# Patient Record
Sex: Female | Born: 1955 | Race: Black or African American | Hispanic: No | Marital: Single | Smoking: Never smoker
Health system: Southern US, Community
[De-identification: ages and names within clinical notes are randomized; demographics above are authoritative.]

## PROBLEM LIST (undated history)

## (undated) DIAGNOSIS — K219 Gastro-esophageal reflux disease without esophagitis: Secondary | ICD-10-CM

## (undated) DIAGNOSIS — Z8049 Family history of malignant neoplasm of other genital organs: Secondary | ICD-10-CM

## (undated) DIAGNOSIS — Z800 Family history of malignant neoplasm of digestive organs: Secondary | ICD-10-CM

## (undated) DIAGNOSIS — C55 Malignant neoplasm of uterus, part unspecified: Secondary | ICD-10-CM

## (undated) DIAGNOSIS — I10 Essential (primary) hypertension: Secondary | ICD-10-CM

## (undated) DIAGNOSIS — C541 Malignant neoplasm of endometrium: Secondary | ICD-10-CM

## (undated) DIAGNOSIS — M1990 Unspecified osteoarthritis, unspecified site: Secondary | ICD-10-CM

## (undated) DIAGNOSIS — Z6841 Body Mass Index (BMI) 40.0 and over, adult: Secondary | ICD-10-CM

## (undated) DIAGNOSIS — Z808 Family history of malignant neoplasm of other organs or systems: Secondary | ICD-10-CM

## (undated) DIAGNOSIS — L509 Urticaria, unspecified: Secondary | ICD-10-CM

## (undated) HISTORY — DX: Malignant neoplasm of endometrium: C54.1

## (undated) HISTORY — PX: TONSILLECTOMY: SUR1361

## (undated) HISTORY — DX: Family history of malignant neoplasm of digestive organs: Z80.0

## (undated) HISTORY — DX: Family history of malignant neoplasm of other organs or systems: Z80.8

## (undated) HISTORY — PX: WISDOM TOOTH EXTRACTION: SHX21

## (undated) HISTORY — DX: Urticaria, unspecified: L50.9

## (undated) HISTORY — DX: Body Mass Index (BMI) 40.0 and over, adult: Z684

## (undated) HISTORY — DX: Morbid (severe) obesity due to excess calories: E66.01

## (undated) HISTORY — DX: Unspecified osteoarthritis, unspecified site: M19.90

## (undated) HISTORY — DX: Family history of malignant neoplasm of other genital organs: Z80.49

## (undated) HISTORY — DX: Essential (primary) hypertension: I10

## (undated) HISTORY — DX: Malignant neoplasm of uterus, part unspecified: C55

---

## 1994-05-18 HISTORY — PX: LAPAROSCOPY: SHX197

## 1997-09-19 ENCOUNTER — Other Ambulatory Visit: Admission: RE | Admit: 1997-09-19 | Discharge: 1997-09-19 | Payer: Self-pay | Admitting: Obstetrics and Gynecology

## 1998-07-11 ENCOUNTER — Encounter: Payer: Self-pay | Admitting: Obstetrics and Gynecology

## 1998-07-11 ENCOUNTER — Ambulatory Visit (HOSPITAL_COMMUNITY): Admission: RE | Admit: 1998-07-11 | Discharge: 1998-07-11 | Payer: Self-pay | Admitting: *Deleted

## 2000-11-17 ENCOUNTER — Other Ambulatory Visit: Admission: RE | Admit: 2000-11-17 | Discharge: 2000-11-17 | Payer: Self-pay | Admitting: Obstetrics and Gynecology

## 2001-12-30 ENCOUNTER — Other Ambulatory Visit: Admission: RE | Admit: 2001-12-30 | Discharge: 2001-12-30 | Payer: Self-pay | Admitting: Obstetrics and Gynecology

## 2009-05-18 HISTORY — PX: OTHER SURGICAL HISTORY: SHX169

## 2019-06-07 ENCOUNTER — Other Ambulatory Visit: Payer: Self-pay | Admitting: Family

## 2019-06-07 DIAGNOSIS — N938 Other specified abnormal uterine and vaginal bleeding: Secondary | ICD-10-CM

## 2019-06-12 ENCOUNTER — Ambulatory Visit
Admission: RE | Admit: 2019-06-12 | Discharge: 2019-06-12 | Disposition: A | Discharge status: Home / Self Care | Payer: 59 | Source: Ambulatory Visit | Attending: Family | Admitting: Family

## 2019-06-12 DIAGNOSIS — N938 Other specified abnormal uterine and vaginal bleeding: Secondary | ICD-10-CM

## 2019-06-15 ENCOUNTER — Other Ambulatory Visit: Payer: Self-pay | Admitting: Primary Care

## 2019-06-30 ENCOUNTER — Other Ambulatory Visit (HOSPITAL_COMMUNITY)
Admission: RE | Admit: 2019-06-30 | Discharge: 2019-06-30 | Disposition: A | Discharge status: Home / Self Care | Payer: 59 | Source: Ambulatory Visit | Attending: Family Medicine | Admitting: Family Medicine

## 2019-06-30 ENCOUNTER — Ambulatory Visit (INDEPENDENT_AMBULATORY_CARE_PROVIDER_SITE_OTHER): Payer: 59 | Admitting: Family Medicine

## 2019-06-30 ENCOUNTER — Encounter: Payer: Self-pay | Admitting: Family Medicine

## 2019-06-30 ENCOUNTER — Other Ambulatory Visit: Payer: Self-pay

## 2019-06-30 VITALS — BP 135/83 | HR 86 | Temp 97.8°F | Ht 61.0 in | Wt 270.8 lb

## 2019-06-30 DIAGNOSIS — N930 Postcoital and contact bleeding: Secondary | ICD-10-CM | POA: Diagnosis present

## 2019-06-30 DIAGNOSIS — E66813 Obesity, class 3: Secondary | ICD-10-CM | POA: Insufficient documentation

## 2019-06-30 DIAGNOSIS — E7800 Pure hypercholesterolemia, unspecified: Secondary | ICD-10-CM | POA: Insufficient documentation

## 2019-06-30 DIAGNOSIS — N888 Other specified noninflammatory disorders of cervix uteri: Secondary | ICD-10-CM

## 2019-06-30 DIAGNOSIS — I10 Essential (primary) hypertension: Secondary | ICD-10-CM | POA: Insufficient documentation

## 2019-06-30 DIAGNOSIS — N950 Postmenopausal bleeding: Secondary | ICD-10-CM | POA: Diagnosis not present

## 2019-06-30 NOTE — Progress Notes (Signed)
   Subjective:    Patient ID: [name] L Heron is a 64 y.o. female presenting with New Gyn (DUB)  on 06/30/2019  HPI: Menopause age 39. Bleeding 2016, endometrial biopsy negative at that time. No further bleeding until 03/2019. Still having bleeding, persistent flow x many days since then. bleeding comes and goes. Last pap was 05/2019 and showed AGUS, favor neoplasm + ASCUS. U/S shows thickened lining with solid mass that needs biopsy.  Review of Systems  Constitutional: Negative for chills and fever.  Respiratory: Negative for shortness of breath.   Cardiovascular: Negative for chest pain.  Gastrointestinal: Negative for abdominal pain, nausea and vomiting.  Genitourinary: Negative for dysuria.  Skin: Negative for rash.      Objective:    BP 135/83   Pulse 86   Temp 97.8 F (36.6 C)   Ht 5\' 1"  (1.549 m)   Wt 270 lb 12.8 oz (122.8 kg)   BMI 51.17 kg/m  Physical Exam Constitutional:      General: She is not in acute distress.    Appearance: She is well-developed.  HENT:     Head: Normocephalic and atraumatic.  Eyes:     General: No scleral icterus. Cardiovascular:     Rate and Rhythm: Normal rate.  Pulmonary:     Effort: Pulmonary effort is normal.  Abdominal:     Palpations: Abdomen is soft.  Genitourinary:    Comments: BUS normal, vagina is pink and rugated, cervix has mass protruding from os.   Musculoskeletal:     Cervical back: Neck supple.  Skin:    General: Skin is warm and dry.  Neurological:     Mental Status: She is alert and oriented to person, place, and time.    Procedure: Cervix visualized and mass noted.  Mass biopsied x 2 with ring forceps.  Hemostasis obtained with Monsel's solution.  Procedure: Portio of cervix cleansed x 2 with betadine swabs.  A tenaculum was placed in the anterior lip of the cervix.  The uterus was sounded for depth of 10 cm. A pipelle was introduced to into the uterus, suction created,  and an endometrial sample was  obtained. All equipment was removed and accounted for.  The patient tolerated the procedure well.       Assessment & Plan:   Problem List Items Addressed This Visit      Unprioritized   Postmenopausal bleeding    Endometrial sampling today as well. If comes back negative, would consider D & C with Hysteroscopy and Myosure to ensure adequate sampling--given age and u/s findings and risks.      Relevant Orders   Surgical pathology( [place]/ [facility])   Cervical mass - Primary    Unclear if this is a primary cervical vs. Endometrial--biopsy pending      Relevant Orders   Surgical pathology( [place]/ [facility])   Obesity, morbid, BMI 50 or higher ([place])      Total time reviewing records, notes, images and with patient, and documenting: 42 minutes.  Return in about 4 weeks (around 07/28/2019) for virtual.  Donnamae Jude 06/30/2019 3:53 PM

## 2019-06-30 NOTE — Patient Instructions (Signed)

## 2019-06-30 NOTE — Assessment & Plan Note (Signed)
Unclear if this is a primary cervical vs. Endometrial--biopsy pending

## 2019-06-30 NOTE — Progress Notes (Signed)
Pt states she has been menopausal since age 64. She had one episode of bleeding @ age 46 - no tests done. She saw physician in [place]. Then light bleeding began again @ age 80 - she had Endo Bx which was normal. The bleeding stopped and then returned in August 2020, stopped until November. Since November, pt has had continuous bleeding and abdominal cramping. Pt had [place] in January @ [place] and was referred to this office by Dr. Burnett Sheng.

## 2019-06-30 NOTE — Assessment & Plan Note (Signed)
Endometrial sampling today as well. If comes back negative, would consider D & C with Hysteroscopy and Myosure to ensure adequate sampling--given age and u/s findings and risks.

## 2019-07-03 ENCOUNTER — Telehealth: Payer: Self-pay

## 2019-07-03 ENCOUNTER — Telehealth: Payer: Self-pay | Admitting: Family Medicine

## 2019-07-03 ENCOUNTER — Encounter: Payer: Self-pay | Admitting: Gynecologic Oncology

## 2019-07-03 LAB — SURGICAL PATHOLOGY

## 2019-07-03 NOTE — Telephone Encounter (Signed)
Called patient and informed her of her biopsy results, which shows probable endometrial cancer. Stopped bleeding over the weekend. Advised to see GYN/Onc with Dr. Berline Lopes 07/06/2019 at 11:00 at the [facility].

## 2019-07-03 NOTE — Progress Notes (Deleted)
GYNECOLOGIC ONCOLOGY NEW PATIENT CONSULTATION   Patient Name: Brittney Lamb  Patient Age: 64 y.o. Date of Service: *** Referring Provider: [name], MD [address] [place],  [place] [address]   Primary Care Provider: [name], MD Consulting Provider: Jeral Pinch, MD   Assessment/Plan:  ***   A copy of this note was sent to the patient's referring provider.   Jeral Pinch, MD  Division of Gynecologic Oncology  Department of Obstetrics and Gynecology  [facility]  ___________________________________________  Chief Complaint: No chief complaint on file.   History of Present Illness:  Brittney Lamb is a 64 y.o. y.o. female who is seen in consultation at the request of [name], MD for an evaluation of ***  ***Menopause age 64. Bleeding 2016, endometrial biopsy negative at that time. No further bleeding until 03/2019. Still having bleeding, persistent flow x many days since then. bleeding comes and goes. Last pap was 05/2019 and showed AGUS, favor neoplasm + ASCUS. U/S shows thickened lining with solid mass that needs biopsy.  EMB was performed by Dr. Kennon Rounds on 2/12.  PAST MEDICAL HISTORY:  Past Medical History:  Diagnosis Date  . Arthritis    Spinal - inflammatory  . BMI 50.0-59.9, adult ([place])   . Endometrial cancer ([place])   . Hypertension   . Obesity      PAST SURGICAL HISTORY:  *** The histories are not reviewed yet. Please review them in the "History" navigator section and refresh this [place].  OB/GYN HISTORY:  OB History  Gravida Para Term Preterm AB Living  0 0 0 0 0 0  SAB TAB Ectopic Multiple Live Births  0 0 0 0 0    No LMP recorded. Patient is postmenopausal.  Age at menarche: ***  Age at menopause: *** Hx of HRT: *** Hx of STDs: *** Last pap: *** History of abnormal pap smears: ***  SCREENING STUDIES:  Last mammogram: ***  Last colonoscopy: *** Last bone mineral density:  ***  MEDICATIONS: Outpatient Encounter Medications as of 07/06/2019  Medication Sig  . amLODipine (NORVASC) 2.5 MG tablet Take 2.5 mg by mouth daily.  . hydrochlorothiazide (HYDRODIURIL) 25 MG tablet Take 25 mg by mouth daily.  . meloxicam (MOBIC) 15 MG tablet Take 15 mg by mouth daily.  . Multiple Vitamin (MULTIVITAMIN) tablet Take 1 tablet by mouth daily.  . rosuvastatin (CRESTOR) 20 MG tablet Take 20 mg by mouth daily.  . vitamin B-12 (CYANOCOBALAMIN) 500 MCG tablet Take 500 mcg by mouth daily.   No facility-administered encounter medications on file as of 07/06/2019.    ALLERGIES:  Allergies  Allergen Reactions  . Lisinopril Swelling  . Saxenda [Liraglutide -Weight Management] Itching and Rash     FAMILY HISTORY:  Family History  Problem Relation Age of Onset  . Heart disease Father   . Thyroid disease Father   . Hypertension Mother   . COPD Mother   . Hypertension Brother   . Thyroid disease Sister   . COPD Sister   . Hypertension Sister   . Neuropathy Sister      SOCIAL HISTORY:    Social Connections:   . Frequency of Communication with Friends and Family: Not on file  . Frequency of Social Gatherings with Friends and Family: Not on file  . Attends Religious Services: Not on file  . Active Member of Clubs or Organizations: Not on file  . Attends Archivist Meetings: Not on file  .  Marital Status: Not on file    REVIEW OF SYSTEMS:  Denies appetite changes, fevers, chills, fatigue, unexplained weight changes. Denies hearing loss, neck lumps or masses, mouth sores, ringing in ears or voice changes. Denies cough or wheezing.  Denies shortness of breath. Denies chest pain or palpitations. Denies leg swelling. Denies abdominal distention, pain, blood in stools, constipation, diarrhea, nausea, vomiting, or early satiety. Denies pain with intercourse, dysuria, frequency, hematuria or incontinence. Denies hot flashes, pelvic pain, vaginal bleeding or vaginal  discharge.   Denies joint pain, back pain or muscle pain/cramps. Denies itching, rash, or wounds. Denies dizziness, headaches, numbness or seizures. Denies swollen lymph nodes or glands, denies easy bruising or bleeding. Denies anxiety, depression, confusion, or decreased concentration.  Physical Exam:  Vital Signs for this encounter:  There were no vitals taken for this visit. There is no height or weight on file to calculate BMI. General: Alert, oriented, no acute distress.  HEENT: Normocephalic, atraumatic. Sclera anicteric.  Chest: Clear to auscultation bilaterally. No wheezes, rhonchi, or rales. Cardiovascular: Regular rate and rhythm, no murmurs, rubs, or gallops.  Abdomen: ***Obese. Normoactive bowel sounds. Soft, nondistended, nontender to palpation. No masses or hepatosplenomegaly appreciated. No palpable fluid wave.  Extremities: Grossly normal range of motion. Warm, well perfused. No edema bilaterally.  Skin: No rashes or lesions.  Lymphatics: No cervical, supraclavicular, or inguinal adenopathy.  GU:  Normal external female genitalia. ***  No lesions. No discharge or bleeding.             Bladder/urethra:  No lesions or masses, well supported bladder             Vagina: ***             Cervix: Normal appearing, no lesions.             Uterus: *** Small, mobile, no parametrial involvement or nodularity.             Adnexa: *** masses.  Rectal: ***  LABORATORY AND RADIOLOGIC DATA:  ***Outside medical records were reviewed to synthesize the above history, along with the history and physical obtained during the visit.   No results found for: WBC, HGB, HCT, PLT, GLUCOSE, CHOL, TRIG, HDL, LDLDIRECT, LDLCALC, ALT, AST, NA, K, CL, CREATININE, BUN, CO2, TSH, PSA, INR, GLUF, HGBA1C, MICROALBUR  06/30/19: FINAL MICROSCOPIC DIAGNOSIS:  A. ENDOMETRIUM, BIOPSY:  - Adenocarcinoma, see comment.  B. CERVICAL, POLYPECTOMY:  - Adenocarcinoma, see comment.  COMMENT:  A. and B. 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 Dr. Saralyn Pilar has reviewed the case. Dr. Kennon Rounds was paged on 07/03/2019.   Pelvic ultrasound on 1/25: IMPRESSION: 1. Possible 1.9 cm solid echogenic endometrial mass. In the setting of post-menopausal bleeding, endometrial sampling is indicated to exclude carcinoma. If results are benign, sonohysterogram should be considered for focal lesion work-up prior to hysteroscopy. (Ref: Radiological Reasoning: Algorithmic Workup of Abnormal Vaginal Bleeding with Endovaginal Sonography and Sonohysterography. AJR 2008; [id number]) 2. Enlarged uterus with at least 1 calcified fibroid at the fundus measuring 2.6 cm. 3. Heterogenous fluid within the endometrial canal, can be seen in the setting of cervical obstruction. 4. Nonvisualized ovaries

## 2019-07-03 NOTE — Telephone Encounter (Signed)
Gave Dr. Kennon Rounds  A new patient appointment  For 07-06-19 at 1130 am with arrival at 1100. Pt with endometrial cancer.

## 2019-07-05 ENCOUNTER — Telehealth: Payer: Self-pay | Admitting: *Deleted

## 2019-07-05 NOTE — Telephone Encounter (Signed)
Patient returned call and decided to move her appt to next week due to the pending weather

## 2019-07-06 ENCOUNTER — Inpatient Hospital Stay: Payer: 59 | Admitting: Gynecologic Oncology

## 2019-07-12 NOTE — Progress Notes (Addendum)
GYNECOLOGIC ONCOLOGY NEW PATIENT CONSULTATION   Patient Name: Brittney Lamb  Patient Age: 64 y.o. Date of Service: 07/14/19 Referring Provider: [name], MD [address] 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  We reviewed the nature of endometrial cancer and its recommended surgical staging, including total hysterectomy, bilateral salpingo-oophorectomy, and lymph node assessment. The patient is a suitable candidate for staging via a minimally invasive approach to surgery.  We reviewed that robotic assistance would be used to complete the surgery.    We discussed that most endometrial cancer is detected early, however, we reviewed that adjuvant therapy will likely be recommended based on the patient's biopsy, however, we will defer to final pathology results.    Given her high risk histology, we recommend CT scan preoperatively to rule out metastatic disease.  We will also get a CA-125 today.  I am somewhat suspicious that the patient has cervical involvement based on exam today.  While sentinel lymph node biopsy for uterine cancer is typically used only in the setting of clinical stage I disease, I recommend proceeding with sentinel lymph node biopsy in this patient.  Given her significant obesity, surgical lymph node evaluation will be challenging.  Given her high risk histology, especially if she has cervical involvement, she is going to be a candidate for adjuvant therapy.  I worry about the increased morbidity of surgery with an open procedure for lymph node assessment only and feel that it would be quite challenging to perform a lymph node dissection even with the use of a laparotomy.  We also discussed the possibility that she may not tolerate Trendelenburg due to her  weight.  We reviewed the sentinel lymph node technique. Risks and benefits of sentinel lymph node biopsy was reviewed. We reviewed the technique and ICG dye. The patient DOES NOT have an iodine allergy or known liver dysfunction. We reviewed the false negative rate (0.4%), and that 3% of patients with metastatic disease will not have it detected by SLN biopsy in endometrial cancer. A low risk of allergic reaction to the dye, <0.2% for ICG, has been reported. We also discussed that in the case of failed mapping, which occurs 40% of the time, a bilateral or unilateral lymphadenectomy will be performed at the surgeon's discretion.   Potential benefits of sentinel nodes including a higher detection rate for metastasis due to ultrastaging and potential reduction in operative morbidity. However, there remains uncertainty as to the role for treatment of micrometastatic disease. Further, the benefit of operative morbidity associated with the SLN technique in endometrial cancer is not yet completely known. In other patient populations (e.g. the cervical cancer population) there has been observed reductions in morbidity with SLN biopsy compared to pelvic lymphadenectomy. Lymphedema, nerve dysfunction and lymphocysts are all potential risks with the SLN technique as with complete lymphadenectomy. Additional risks to the patient include the risk of damage to an internal organ while operating in an altered view (e.g. the black and white image of the robotic fluorescence imaging mode).   We discussed plan for a robotic assisted hysterectomy, bilateral salpingo-oophorectomy, sentinel lymph node evaluation, possible lymph node dissection, possible laparotomy. The risks of surgery were discussed in detail and she understands these to include infection; wound separation; hernia; vaginal cuff separation, injury to adjacent organs such as bowel, bladder,  blood vessels, ureters and nerves; bleeding which may require blood  transfusion; anesthesia risk; thromboembolic events; possible death; unforeseen complications; possible need for re-exploration; medical complications such as heart attack, stroke, pleural effusion and pneumonia; and, if full lymphadenectomy is performed the risk of lymphedema and lymphocyst. The patient will receive DVT and antibiotic prophylaxis as indicated. She voiced a clear understanding. She had the opportunity to ask questions. Perioperative instructions were reviewed with her. Prescriptions for post-op medications were sent to her pharmacy of choice.  A copy of this note was sent to the patient's referring provider.   50 minutes of total time was spent for this patient encounter, including preparation, face-to-face counseling with the patient and coordination of care, and documentation of the encounter.   Jeral Pinch, MD  Division of Gynecologic Oncology  Department of Obstetrics and Gynecology  [facility]  ___________________________________________  Chief Complaint: Chief Complaint  Patient presents with  . Endometrial cancer [facility])    New patient    History of Present Illness:  Brittney Lamb is a 64 y.o. y.o. female who is seen in consultation at the request of Dr. 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  Patient reports having postmenopausal bleeding since November that started as spotting and then became heavier like a menses.  She had bleeding intermittently, not daily.  She endorsed cramps and passage of clots.  Since menopause, she has had 2 shorter periods (1-3 days) of spotting in the last 4-5 years.  She underwent a Pap test in January 2021 that showed Herbert Pun, favor neoplasm.  Ultrasound showed thickened endometrial lining with solid mass.  She underwent endometrial biopsy on 2/12 with findings noted below.  Overall she reports doing well.  She endorses a normal appetite without  nausea or emesis.  She denies any early satiety.  She reports normal bowel and bladder function.  She has some shortness of breath with ambulation but can climb a flight of stairs without difficulty.  She had laparoscopic surgery in the 1990s for what sounds like infertility and some tubal disease.  At that time, she was told that she had several uterine fibroids.  She denies any other abdominal surgery.  PAST MEDICAL HISTORY:  Past Medical History:  Diagnosis Date  . Arthritis    Spinal - inflammatory  . BMI 50.0-59.9, adult ([place])   . Endometrial cancer ([place])   . Hypertension   . Obesity   . Uterine cancer ([place])      PAST SURGICAL HISTORY:  Past Surgical History:  Procedure Laterality Date  . cardiac catherization  2011  . LAPAROSCOPY  1996   scar tissue  . TONSILLECTOMY    . WISDOM TOOTH EXTRACTION      OB/GYN HISTORY:  OB History  Gravida Para Term Preterm AB Living  0 0 0 0 0 0  SAB TAB Ectopic Multiple Live Births  0 0 0 0 0    No LMP recorded. Patient is postmenopausal.  Age at menarche: 66  Age at menopause: 57 Hx of HRT: no Hx of STDs: no Last pap: 2021 History of abnormal pap smears: reports abnormal pap in 1990s that did not require biopsy or procedures  SCREENING STUDIES:  Last mammogram: 2020  Last colonoscopy: 2020  MEDICATIONS: Outpatient Encounter Medications as of 07/14/2019  Medication Sig  . amLODipine (NORVASC) 2.5 MG tablet Take 2.5 mg by mouth daily.  . hydrochlorothiazide (HYDRODIURIL) 25 MG tablet Take 25 mg  by mouth daily.  Marland Kitchen ibuprofen (ADVIL) 600 MG tablet Take 1 tablet (600 mg total) by mouth every 6 (six) hours as needed for moderate pain. For AFTER surgery only  . meloxicam (MOBIC) 15 MG tablet Take 15 mg by mouth daily.  . Multiple Vitamin (MULTIVITAMIN) tablet Take 1 tablet by mouth daily.  Marland Kitchen oxyCODONE (OXY IR/ROXICODONE) 5 MG immediate release tablet Take 1 tablet (5 mg total) by mouth every 4 (four) hours as needed for severe pain.  For AFTER surgery only, do not take and drive  . rosuvastatin (CRESTOR) 20 MG tablet Take 20 mg by mouth daily.  Marland Kitchen senna-docusate (SENOKOT-S) 8.6-50 MG tablet Take 2 tablets by mouth at bedtime. For AFTER surgery, do not take if having diarrhea  . vitamin B-12 (CYANOCOBALAMIN) 500 MCG tablet Take 500 mcg by mouth daily.   No facility-administered encounter medications on file as of 07/14/2019.    ALLERGIES:  Allergies  Allergen Reactions  . Lisinopril Swelling  . Saxenda [Liraglutide -Weight Management] Itching and Rash     FAMILY HISTORY:  Family History  Problem Relation Age of Onset  . Heart disease Father   . Thyroid disease Father   . Hypertension Mother   . COPD Mother   . Hypertension Brother   . Thyroid disease Sister   . COPD Sister   . Hypertension Sister   . Neuropathy Sister   . Ovarian cancer Paternal Aunt   . Colon cancer Neg Hx   . Uterine cancer Neg Hx   . Breast cancer Neg Hx      SOCIAL HISTORY:    Social Connections:   . Frequency of Communication with Friends and Family: Not on file  . Frequency of Social Gatherings with Friends and Family: Not on file  . Attends Religious Services: Not on file  . Active Member of Clubs or Organizations: Not on file  . Attends Archivist Meetings: Not on file  . Marital Status: Not on file    REVIEW OF SYSTEMS:  + vaginal bleeding Denies appetite changes, fevers, chills, fatigue, unexplained weight changes. Denies hearing loss, neck lumps or masses, mouth sores, ringing in ears or voice changes. Denies cough or wheezing.  Denies shortness of breath. Denies chest pain or palpitations. Denies leg swelling. Denies abdominal distention, pain, blood in stools, constipation, diarrhea, nausea, vomiting, or early satiety. Denies pain with intercourse, dysuria, frequency, hematuria or incontinence. Denies hot flashes, pelvic pain or vaginal discharge.   Denies joint pain, back pain or muscle  pain/cramps. Denies itching, rash, or wounds. Denies dizziness, headaches, numbness or seizures. Denies swollen lymph nodes or glands, denies easy bruising or bleeding. Denies anxiety, depression, confusion, or decreased concentration.  Physical Exam:  Vital Signs for this encounter:  Blood pressure (!) 157/96, pulse 77, temperature 98.9 F (37.2 C), temperature source Temporal, resp. rate 18, SpO2 100 %. There is no height or weight on file to calculate BMI. General: Alert, oriented, no acute distress.  HEENT: Normocephalic, atraumatic. Sclera anicteric.  Chest: Clear to auscultation bilaterally, somewhat distant breath sounds. No wheezes, rhonchi, or rales. Cardiovascular: Regular rate and rhythm, no murmurs, rubs, or gallops.  Abdomen: Obese. Normoactive bowel sounds. Soft, nondistended, nontender to palpation. No masses or hepatosplenomegaly appreciated. No palpable fluid wave.  Extremities: Grossly normal range of motion. Warm, well perfused. Trace edema bilaterally.  Skin: No rashes or lesions.  Lymphatics: No cervical, supraclavicular, or inguinal adenopathy.  GU:  Normal external female genitalia. No lesions. No discharge or bleeding.  Bladder/urethra:  No lesions or masses, well supported bladder             Vagina: Mildly atrophic, no lesions.             Cervix: Normal appearing ectocervix, cervical os dilated approximately 1 cm with friable endocervical tissue and some possibly necrotic appearing tissue within the canal.  On bimanual exam, cervix mildly firm.             Uterus: mobile, no parametrial involvement or nodularity.             Adnexa: No masses appreciated.  Rectal: No nodularity.  LABORATORY AND RADIOLOGIC DATA:  Outside medical records were reviewed to synthesize the above history, along with the history and physical obtained during the visit.   Lab Results  Component Value Date   GLUCOSE 94 07/14/2019   ALT 14 07/14/2019   AST 18 07/14/2019    NA 142 07/14/2019   K 3.5 07/14/2019   CL 103 07/14/2019   CREATININE 0.97 07/14/2019   BUN 14 07/14/2019   CO2 31 07/14/2019     06/30/19: FINAL MICROSCOPIC DIAGNOSIS:  A. ENDOMETRIUM, BIOPSY:  - Adenocarcinoma, see comment.  B. CERVICAL, POLYPECTOMY:  - Adenocarcinoma, see comment.  COMMENT:  A. and B. The morphology is most consistent with an endometrial adenocarcinoma, specifically high grade serous carcinoma. Dr. Saralyn Pilar has reviewed the case. Dr. Kennon Rounds was paged on 07/03/2019.   Pelvic ultrasound on 1/25: IMPRESSION: 1. Possible 1.9 cm solid echogenic endometrial mass. In the setting of post-menopausal bleeding, endometrial sampling is indicated to exclude carcinoma. If results are benign, sonohysterogram should be considered for focal lesion work-up prior to hysteroscopy. (Ref: Radiological Reasoning: Algorithmic Workup of Abnormal Vaginal Bleeding with Endovaginal Sonography and Sonohysterography. AJR 2008; [id number]) 2. Enlarged uterus with at least 1 calcified fibroid at the fundus measuring 2.6 cm. 3. Heterogenous fluid within the endometrial canal, can be seen in the setting of cervical obstruction. 4. Nonvisualized ovaries

## 2019-07-14 ENCOUNTER — Inpatient Hospital Stay (HOSPITAL_BASED_OUTPATIENT_CLINIC_OR_DEPARTMENT_OTHER): Payer: 59 | Admitting: Gynecologic Oncology

## 2019-07-14 ENCOUNTER — Encounter: Payer: Self-pay | Admitting: Gynecologic Oncology

## 2019-07-14 ENCOUNTER — Other Ambulatory Visit: Payer: Self-pay | Admitting: Gynecologic Oncology

## 2019-07-14 ENCOUNTER — Inpatient Hospital Stay: Payer: 59 | Attending: Gynecologic Oncology

## 2019-07-14 ENCOUNTER — Other Ambulatory Visit: Payer: Self-pay

## 2019-07-14 VITALS — BP 157/96 | HR 77 | Temp 98.9°F | Resp 18

## 2019-07-14 DIAGNOSIS — C541 Malignant neoplasm of endometrium: Secondary | ICD-10-CM | POA: Insufficient documentation

## 2019-07-14 LAB — COMPREHENSIVE METABOLIC PANEL
ALT: 14 U/L (ref 0–44)
AST: 18 U/L (ref 15–41)
Albumin: 3.8 g/dL (ref 3.5–5.0)
Alkaline Phosphatase: 81 U/L (ref 38–126)
Anion gap: 8 (ref 5–15)
BUN: 14 mg/dL (ref 8–23)
CO2: 31 mmol/L (ref 22–32)
Calcium: 9.6 mg/dL (ref 8.9–10.3)
Chloride: 103 mmol/L (ref 98–111)
Creatinine, Ser: 0.97 mg/dL (ref 0.44–1.00)
GFR calc Af Amer: >60 mL/min (ref >60)
GFR calc non Af Amer: >60 mL/min (ref >60)
Glucose, Bld: 94 mg/dL (ref 70–99)
Potassium: 3.5 mmol/L (ref 3.5–5.1)
Sodium: 142 mmol/L (ref 135–145)
Total Bilirubin: 0.5 mg/dL (ref 0.3–1.2)
Total Protein: 7.6 g/dL (ref 6.5–8.1)

## 2019-07-14 MED ORDER — IBUPROFEN 600 MG PO TABS: 600.0000 mg | ORAL_TABLET | Freq: Four times a day (QID) | ORAL | 0 refills | Status: DC | PRN

## 2019-07-14 MED ORDER — OXYCODONE HCL 5 MG PO TABS: 5.0000 mg | ORAL_TABLET | ORAL | 0 refills | Status: DC | PRN

## 2019-07-14 MED ORDER — SENNOSIDES-DOCUSATE SODIUM 8.6-50 MG PO TABS: 2.0000 | ORAL_TABLET | Freq: Every day | ORAL | 1 refills | Status: DC

## 2019-07-14 NOTE — Patient Instructions (Addendum)
Plan to have a CA 125 level drawn and metabolic panel today to evaluate your kidney function today and a CT scan prior to surgery.   Preparing for your Surgery  Plan for surgery on July 25, 2019 with Dr. Jeral Pinch at [place] will be scheduled for a robotic assisted total laparoscopic hysterectomy, bilateral salpingo-oophorectomy, sentinel lymph node biopsy, possible laparotomy, possible lymph node dissection.   Pre-operative Testing -You will receive a phone call from presurgical testing at [facility] to arrange for a pre-operative appointment over the phone, lab appointment, and COVID test.  -Bring your insurance card, copy of an advanced directive if applicable, medication list  -At that visit, you will be asked to sign a consent for a possible blood transfusion in case a transfusion becomes necessary during surgery.  The need for a blood transfusion is rare but having consent is a necessary part of your care.    -You should not be taking blood thinners or aspirin at least ten days prior to surgery unless instructed by your surgeon.  -Do not take supplements such as fish oil (omega 3), red yeast rice, tumeric before your surgery.   Day Before Surgery at [place] will be asked to take in a light diet the day before surgery.  Avoid carbonated beverages.  You will be advised to have nothing to eat or drink after midnight the evening before.    Eat a light diet the day before surgery.  Examples including soups, broths, toast, yogurt, mashed potatoes.  Things to avoid include carbonated beverages (fizzy beverages), raw fruits and raw vegetables, or beans.   If your bowels are filled with gas, your surgeon will have difficulty visualizing your pelvic organs which increases your surgical risks.  Your role in recovery Your role is to become active as soon as directed by your doctor, while still giving yourself time to heal.  Rest when you feel tired. You will be  asked to do the following in order to speed your recovery:  - Cough and breathe deeply. This helps to clear and expand your lungs and can prevent pneumonia after surgery.  - [place]. Do mild physical activity. Walking or moving your legs help your circulation and body functions return to normal. Do not try to get up or walk alone the first time after surgery.   -If you develop swelling on one leg or the other, pain in the back of your leg, redness/warmth in one of your legs, please call the office or go to the Emergency Room to have a doppler to rule out a blood clot. For shortness of breath, chest pain-seek care in the Emergency Room as soon as possible. - Actively manage your pain. Managing your pain lets you move in comfort. We will ask you to rate your pain on a scale of zero to 10. It is your responsibility to tell your doctor or nurse where and how much you hurt so your pain can be treated.  Special Considerations -If you are diabetic, you may be placed on insulin after surgery to have closer control over your blood sugars to promote healing and recovery.  This does not mean that you will be discharged on insulin.  If applicable, your oral antidiabetics will be resumed when you are tolerating a solid diet.  -Your final pathology results from surgery should be available around one week after surgery and the results will be relayed to you when available.  -  FMLA forms can be faxed to [phone] and please allow 5-7 business days for completion.  Pain Management After Surgery -You have been prescribed your pain medication and bowel regimen medications before surgery so that you can have these available when you are discharged from the hospital. The pain medication is for use ONLY AFTER surgery and a new prescription will not be given.   -Make sure that you have Tylenol and Ibuprofen at home to use on a regular basis after surgery for pain control. We recommend alternating  the medications every hour to six hours since they work differently and are processed in the body differently for pain relief.  -Review the attached handout on narcotic use and their risks and side effects.   Bowel Regimen -You have been prescribed Sennakot-S to take nightly to prevent constipation especially if you are taking the narcotic pain medication intermittently.  It is important to prevent constipation and drink adequate amounts of liquids. You can stop taking this medication when you are not taking pain medication and you are back on your normal bowel routine.   Blood Transfusion Information (For the consent to be signed before surgery)  We will be checking your blood type before surgery so in case of emergencies, we will know what type of blood you would need.                                            WHAT IS A BLOOD TRANSFUSION?  A transfusion is the replacement of blood or some of its parts. Blood is made up of multiple cells which provide different functions.  Red blood cells carry oxygen and are used for blood loss replacement.  White blood cells fight against infection.  Platelets control bleeding.  Plasma helps clot blood.  Other blood products are available for specialized needs, such as hemophilia or other clotting disorders. BEFORE THE TRANSFUSION  Who gives blood for transfusions?   You may be able to donate blood to be used at a later date on yourself (autologous donation).  Relatives can be asked to donate blood. This is generally not any safer than if you have received blood from a stranger. The same precautions are taken to ensure safety when a relative's blood is donated.  Healthy volunteers who are fully evaluated to make sure their blood is safe. This is blood bank blood. Transfusion therapy is the safest it has ever been in the practice of medicine. Before blood is taken from a donor, a complete history is taken to make sure that person has no history of  diseases nor engages in risky social behavior (examples are intravenous drug use or sexual activity with multiple partners). The donor's travel history is screened to minimize risk of transmitting infections, such as malaria. The donated blood is tested for signs of infectious diseases, such as HIV and hepatitis. The blood is then tested to be sure it is compatible with you in order to minimize the chance of a transfusion reaction. If you or a relative donates blood, this is often done in anticipation of surgery and is not appropriate for emergency situations. It takes many days to process the donated blood. RISKS AND COMPLICATIONS Although transfusion therapy is very safe and saves many lives, the main dangers of transfusion include:   Getting an infectious disease.  Developing a transfusion reaction. This is an allergic reaction to  something in the blood you were given. Every precaution is taken to prevent this. The decision to have a blood transfusion has been considered carefully by your caregiver before blood is given. Blood is not given unless the benefits outweigh the risks.  AFTER SURGERY INSTRUCTIONS  Return to work: 4-6 weeks if applicable  Activity: 1. Be up and out of the bed during the day.  Take a nap if needed.  You may walk up steps but be careful and use the hand rail.  Stair climbing will tire you more than you think, you may need to stop part way and rest.   2. No lifting or straining for 6 weeks over 10 pounds. No pushing, pulling, straining for 6 weeks.  3. No driving for 1 week minimum.  Do not drive if you are taking narcotic pain medicine.   4. You can shower as soon as the next day after surgery. Shower daily.  Use soap and water on your incision and pat dry; don't rub.  No tub baths or submerging your body in water until cleared by your surgeon. If you have the soap that was given to you by pre-surgical testing that was used before surgery, you do not need to use it  afterwards because this can irritate your incisions.   5. No sexual activity and nothing in the vagina for 8 weeks.  6. You may experience a small amount of clear drainage from your incisions, which is normal.  If the drainage persists, increases, or changes color please call the office.  7. Do not use creams, lotions, or ointments such as neosporin on your incisions after surgery until advised by your surgeon because they can cause removal of the dermabond glue on your incisions.    8. You may experience vaginal spotting after surgery or around the 6-8 week mark from surgery when the stitches at the top of the vagina begin to dissolve.  The spotting is normal but if you experience heavy bleeding, call our office.  9. Take Tylenol or ibuprofen first for pain and only use narcotic pain medication for severe pain not relieved by the Tylenol or Ibuprofen.  Monitor your Tylenol intake to a max of 4,000 mg.  Diet: 1. Low sodium Heart Healthy Diet is recommended.  2. It is safe to use a laxative, such as Miralax or Colace, if you have difficulty moving your bowels. You can take Sennakot at bedtime every evening to keep bowel movements regular and to prevent constipation.    Wound Care: 1. Keep clean and dry.  Shower daily.  Reasons to call the Doctor:  Fever - Oral temperature greater than 100.4 degrees Fahrenheit  Foul-smelling vaginal discharge  Difficulty urinating  Nausea and vomiting  Increased pain at the site of the incision that is unrelieved with pain medicine.  Difficulty breathing with or without chest pain  New calf pain especially if only on one side  Sudden, continuing increased vaginal bleeding with or without clots.   Contacts: For questions or concerns you should contact:  Dr. Jeral Pinch at [phone]  Joylene John, NP at [phone]  After Hours: call [phone] and have the GYN Oncologist paged/contacted

## 2019-07-15 LAB — CA 125: Cancer Antigen (CA) 125: 354 U/mL — ABNORMAL HIGH (ref 0.0–38.1)

## 2019-07-20 ENCOUNTER — Encounter (HOSPITAL_COMMUNITY)
Admission: RE | Admit: 2019-07-20 | Discharge: 2019-07-20 | Disposition: A | Discharge status: Home / Self Care | Payer: 59 | Source: Ambulatory Visit | Attending: Gynecologic Oncology | Admitting: Gynecologic Oncology

## 2019-07-20 ENCOUNTER — Other Ambulatory Visit: Payer: Self-pay

## 2019-07-20 ENCOUNTER — Encounter (HOSPITAL_COMMUNITY): Payer: Self-pay

## 2019-07-20 DIAGNOSIS — Z01810 Encounter for preprocedural cardiovascular examination: Secondary | ICD-10-CM | POA: Insufficient documentation

## 2019-07-20 DIAGNOSIS — Z01812 Encounter for preprocedural laboratory examination: Secondary | ICD-10-CM | POA: Insufficient documentation

## 2019-07-20 DIAGNOSIS — R9431 Abnormal electrocardiogram [ECG] [EKG]: Secondary | ICD-10-CM | POA: Insufficient documentation

## 2019-07-20 HISTORY — DX: Gastro-esophageal reflux disease without esophagitis: K21.9

## 2019-07-20 LAB — CBC
HCT: 39.9 % (ref 36.0–46.0)
Hemoglobin: 12.5 g/dL (ref 12.0–15.0)
MCH: 28.1 pg (ref 26.0–34.0)
MCHC: 31.3 g/dL (ref 30.0–36.0)
MCV: 89.7 fL (ref 80.0–100.0)
Platelets: 327 10*3/uL (ref 150–400)
RBC: 4.45 MIL/uL (ref 3.87–5.11)
RDW: 14.6 % (ref 11.5–15.5)
WBC: 10.8 10*3/uL — ABNORMAL HIGH (ref 4.0–10.5)
nRBC: 0 % (ref 0.0–0.2)

## 2019-07-20 LAB — URINALYSIS, ROUTINE W REFLEX MICROSCOPIC
Bacteria, UA: NONE SEEN
Bilirubin Urine: NEGATIVE
Glucose, UA: NEGATIVE mg/dL
Hgb urine dipstick: NEGATIVE
Ketones, ur: NEGATIVE mg/dL
Nitrite: NEGATIVE
Protein, ur: NEGATIVE mg/dL
Specific Gravity, Urine: 1.012 (ref 1.005–1.030)
pH: 6 (ref 5.0–8.0)

## 2019-07-20 NOTE — Progress Notes (Signed)
PCP - Dr. [name] Cardiologist - in 2011 was sent to Dr. Julianne Rice to access patient's heart for any damage due to high blood pressure patient states  Chest x-ray - n/a EKG - 07/20/2019 epic Stress Test - in [place] in 2011 ?Dr. Julianne Rice ECHO - in [place] 2011 ? Dr. Julianne Rice Cardiac Cath - 09/17/2009 at [facility] -negative results  Sleep Study - n/a CPAP - n/a  Fasting Blood Sugar - n/a Checks Blood Sugar ___0__ times a day  Blood Thinner Instructions:n/a Aspirin Instructions:n/a Last Dose:07/20/2019 of Mobic and patient will not take any more before surgery  Anesthesia review:   Patient has a history of endometrial cancer, HTN, BMI-51.39!  Patient denies shortness of breath, fever, cough and chest pain at PAT appointment   Patient verbalized understanding of instructions that were given to them at the PAT appointment. Patient was also instructed that they will need to review over the PAT instructions again at home before surgery.

## 2019-07-20 NOTE — Patient Instructions (Addendum)
DUE TO COVID-19 ONLY ONE VISITOR IS ALLOWED TO COME WITH YOU AND STAY IN THE WAITING ROOM ONLY DURING PRE OP AND PROCEDURE DAY OF SURGERY. THE 1 VISITOR MAY VISIT WITH YOU AFTER SURGERY IN YOUR PRIVATE ROOM DURING VISITING HOURS ONLY!  YOU NEED TO HAVE A COVID 19 TEST ON___Friday 03/05/2021____ @__1 :30 pm_____, THIS TEST MUST BE DONE BEFORE SURGERY, COME  [address] [place] , [id number].  ([place]) ONCE YOUR COVID TEST IS COMPLETED, PLEASE BEGIN THE QUARANTINE INSTRUCTIONS AS OUTLINED IN YOUR HANDOUT.                Brittney Lamb     Your procedure is scheduled on: Tuesday 07/25/2019   Report to [facility] Main  Entrance    Report to Short Stay at   [facility] AM     Call this number if you have problems the morning of surgery [phone]                Eat a light diet the day before surgery.  Examples including soups, broths, toast, yogurt, mashed potatoes.  Things to avoid include carbonated beverages (fizzy beverages), raw fruits and raw vegetables, or beans.               MAY DRINK CLEAR LIQUIDS ALSO THE DAY BEFORE SURGERY ON MONDAY 03/08!   If your bowels are filled with gas, your surgeon will have difficulty visualizing your pelvic organs which increases your surgical risks.    Remember: Do not eat food  :After Midnight.  May have clear liquids  from midnight up until 0430 am then nothing until after surgery!    CLEAR LIQUID DIET   Foods Allowed                                                                     Foods Excluded  Coffee and tea, regular and decaf                             liquids that you cannot  Plain Jell-O any favor except red or purple                                           see through such as: Fruit ices (not with fruit pulp)                                     milk, soups, orange juice  Iced Popsicles                                    All solid food                                   Cranberry, grape and  apple juices Sports drinks like Gatorade Lightly seasoned clear broth or consume(fat free) Sugar, honey  syrup  Sample Menu Breakfast                                Lunch                                     Supper Cranberry juice                    Beef broth                            Chicken broth Jell-O                                     Grape juice                           Apple juice Coffee or tea                        Jell-O                                      Popsicle                                                Coffee or tea                        Coffee or tea  _____________________________________________________________________     BRUSH YOUR TEETH MORNING OF SURGERY AND RINSE YOUR MOUTH OUT, NO CHEWING GUM CANDY OR MINTS.     Take these medicines the morning of surgery with A SIP OF WATER: Amlodipine (Norvasc), Rosuvastatin (Crestor)                                 You may not have any metal on your body including hair pins and              piercings  Do not wear jewelry, make-up, lotions, powders or perfumes, deodorant             Do not wear nail polish on your fingernails.  Do not shave  48 hours prior to surgery.                 Do not bring valuables to the hospital. [place].  Contacts, dentures or bridgework may not be worn into surgery.  Leave suitcase in the car. After surgery it may be brought to your room.     Patients discharged the day of surgery will not be allowed to drive home. IF YOU ARE HAVING SURGERY AND GOING HOME THE SAME DAY, YOU MUST HAVE AN ADULT TO DRIVE YOU HOME AND  BE WITH YOU FOR 24 HOURS. YOU MAY GO HOME BY TAXI OR UBER  OR ORTHERWISE, BUT AN ADULT MUST ACCOMPANY YOU HOME AND STAY WITH YOU FOR 24 HOURS.  Name and phone number of your driver: daughterCaryl Pina  [phone]  Or ex-spouse-Fletcher  [phone]                Please read over the following fact sheets you were  given: _____________________________________________________________________             [facility] - Preparing for Surgery Before surgery, you can play an important role.  Because skin is not sterile, your skin needs to be as free of germs as possible.  You can reduce the number of germs on your skin by washing with CHG (chlorahexidine gluconate) soap before surgery.  CHG is an antiseptic cleaner which kills germs and bonds with the skin to continue killing germs even after washing. Please DO NOT use if you have an allergy to CHG or antibacterial soaps.  If your skin becomes reddened/irritated stop using the CHG and inform your nurse when you arrive at Short Stay. Do not shave (including legs and underarms) for at least 48 hours prior to the first CHG shower.  You may shave your face/neck. Please follow these instructions carefully:  1.  Shower with CHG Soap the night before surgery and the  morning of Surgery.  2.  If you choose to wash your hair, wash your hair first as usual with your  normal  shampoo.  3.  After you shampoo, rinse your hair and body thoroughly to remove the  shampoo.                           4.  Use CHG as you would any other liquid soap.  You can apply chg directly  to the skin and wash                       Gently with a scrungie or clean washcloth.  5.  Apply the CHG Soap to your body ONLY FROM THE NECK DOWN.   Do not use on face/ open                           Wound or open sores. Avoid contact with eyes, ears mouth and genitals (private parts).                       Wash face,  Genitals (private parts) with your normal soap.             6.  Wash thoroughly, paying special attention to the area where your surgery  will be performed.  7.  Thoroughly rinse your body with warm water from the neck down.  8.  DO NOT shower/wash with your normal soap after using and rinsing off  the CHG Soap.                9.  Pat yourself dry with a clean towel.            10.  Wear  clean pajamas.            11.  Place clean sheets on your bed the night of your first shower and do not  sleep with pets. Day of Surgery : Do not apply any lotions/deodorants the morning of surgery.  Please wear clean clothes to the hospital/surgery center.  FAILURE TO FOLLOW THESE INSTRUCTIONS MAY  RESULT IN THE CANCELLATION OF YOUR SURGERY PATIENT SIGNATURE_________________________________  NURSE SIGNATURE__________________________________  ________________________________________________________________________   Adam Phenix  An incentive spirometer is a tool that can help keep your lungs clear and active. This tool measures how well you are filling your lungs with each breath. Taking long deep breaths may help reverse or decrease the chance of developing breathing (pulmonary) problems (especially infection) following:  A long period of time when you are unable to move or be active. BEFORE THE PROCEDURE   If the spirometer includes an indicator to show your best effort, your nurse or respiratory therapist will set it to a desired goal.  If possible, sit up straight or lean slightly forward. Try not to slouch.  Hold the incentive spirometer in an upright position. INSTRUCTIONS FOR USE  1. Sit on the edge of your bed if possible, or sit up as far as you can in bed or on a chair. 2. Hold the incentive spirometer in an upright position. 3. Breathe out normally. 4. Place the mouthpiece in your mouth and seal your lips tightly around it. 5. Breathe in slowly and as deeply as possible, raising the piston or the ball toward the top of the column. 6. Hold your breath for 3-5 seconds or for as long as possible. Allow the piston or ball to fall to the bottom of the column. 7. Remove the mouthpiece from your mouth and breathe out normally. 8. Rest for a few seconds and repeat Steps 1 through 7 at least 10 times every 1-2 hours when you are awake. Take your time and take a few normal  breaths between deep breaths. 9. The spirometer may include an indicator to show your best effort. Use the indicator as a goal to work toward during each repetition. 10. After each set of 10 deep breaths, practice coughing to be sure your lungs are clear. If you have an incision (the cut made at the time of surgery), support your incision when coughing by placing a pillow or rolled up towels firmly against it. Once you are able to get out of bed, walk around indoors and cough well. You may stop using the incentive spirometer when instructed by your caregiver.  RISKS AND COMPLICATIONS  Take your time so you do not get dizzy or light-headed.  If you are in pain, you may need to take or ask for pain medication before doing incentive spirometry. It is harder to take a deep breath if you are having pain. AFTER USE  Rest and breathe slowly and easily.  It can be helpful to keep track of a log of your progress. Your caregiver can provide you with a simple table to help with this. If you are using the spirometer at home, follow these instructions: [place] IF:   You are having difficultly using the spirometer.  You have trouble using the spirometer as often as instructed.  Your pain medication is not giving enough relief while using the spirometer.  You develop fever of 100.5 F (38.1 C) or higher. SEEK IMMEDIATE MEDICAL CARE IF:   You cough up bloody sputum that had not been present before.  You develop fever of 102 F (38.9 C) or greater.  You develop worsening pain at or near the incision site. MAKE SURE YOU:   Understand these instructions.  Will watch your condition.  Will get help right away if you are not doing well or get worse. Document Released: 09/14/2006 Document Revised: 07/27/2011 Document Reviewed: 11/15/2006 ExitCare  Patient Information 2014 [employer], [place].   ________________________________________________________________________  WHAT IS A BLOOD  TRANSFUSION? Blood Transfusion Information  A transfusion is the replacement of blood or some of its parts. Blood is made up of multiple cells which provide different functions.  Red blood cells carry oxygen and are used for blood loss replacement.  White blood cells fight against infection.  Platelets control bleeding.  Plasma helps clot blood.  Other blood products are available for specialized needs, such as hemophilia or other clotting disorders. BEFORE THE TRANSFUSION  Who gives blood for transfusions?   Healthy volunteers who are fully evaluated to make sure their blood is safe. This is blood bank blood. Transfusion therapy is the safest it has ever been in the practice of medicine. Before blood is taken from a donor, a complete history is taken to make sure that person has no history of diseases nor engages in risky social behavior (examples are intravenous drug use or sexual activity with multiple partners). The donor's travel history is screened to minimize risk of transmitting infections, such as malaria. The donated blood is tested for signs of infectious diseases, such as HIV and hepatitis. The blood is then tested to be sure it is compatible with you in order to minimize the chance of a transfusion reaction. If you or a relative donates blood, this is often done in anticipation of surgery and is not appropriate for emergency situations. It takes many days to process the donated blood. RISKS AND COMPLICATIONS Although transfusion therapy is very safe and saves many lives, the main dangers of transfusion include:   Getting an infectious disease.  Developing a transfusion reaction. This is an allergic reaction to something in the blood you were given. Every precaution is taken to prevent this. The decision to have a blood transfusion has been considered carefully by your caregiver before blood is given. Blood is not given unless the benefits outweigh the risks. AFTER THE  TRANSFUSION  Right after receiving a blood transfusion, you will usually feel much better and more energetic. This is especially true if your red blood cells have gotten low (anemic). The transfusion raises the level of the red blood cells which carry oxygen, and this usually causes an energy increase.  The nurse administering the transfusion will monitor you carefully for complications. HOME CARE INSTRUCTIONS  No special instructions are needed after a transfusion. You may find your energy is better. Speak with your caregiver about any limitations on activity for underlying diseases you may have. SEEK MEDICAL CARE IF:   Your condition is not improving after your transfusion.  You develop redness or irritation at the intravenous (IV) site. SEEK IMMEDIATE MEDICAL CARE IF:  Any of the following symptoms occur over the next 12 hours:  Shaking chills.  You have a temperature by mouth above 102 F (38.9 C), not controlled by medicine.  Chest, back, or muscle pain.  People around you feel you are not acting correctly or are confused.  Shortness of breath or difficulty breathing.  Dizziness and fainting.  You get a rash or develop hives.  You have a decrease in urine output.  Your urine turns a dark color or changes to pink, red, or brown. Any of the following symptoms occur over the next 10 days:  You have a temperature by mouth above 102 F (38.9 C), not controlled by medicine.  Shortness of breath.  Weakness after normal activity.  The white part of the eye turns yellow (jaundice).  You have  a decrease in the amount of urine or are urinating less often.  Your urine turns a dark color or changes to pink, red, or brown. Document Released: 05/01/2000 Document Revised: 07/27/2011 Document Reviewed: 12/19/2007 [facility] Patient Information 2014 [place], [place].  _______________________________________________________________________

## 2019-07-21 ENCOUNTER — Ambulatory Visit (HOSPITAL_BASED_OUTPATIENT_CLINIC_OR_DEPARTMENT_OTHER)
Admission: RE | Admit: 2019-07-21 | Discharge: 2019-07-21 | Disposition: A | Discharge status: Home / Self Care | Payer: 59 | Source: Ambulatory Visit | Attending: Gynecologic Oncology | Admitting: Gynecologic Oncology

## 2019-07-21 ENCOUNTER — Other Ambulatory Visit (HOSPITAL_COMMUNITY)
Admission: RE | Admit: 2019-07-21 | Discharge: 2019-07-21 | Disposition: A | Discharge status: Home / Self Care | Payer: 59 | Source: Ambulatory Visit | Attending: Gynecologic Oncology | Admitting: Gynecologic Oncology

## 2019-07-21 DIAGNOSIS — C541 Malignant neoplasm of endometrium: Secondary | ICD-10-CM | POA: Diagnosis not present

## 2019-07-21 DIAGNOSIS — U071 COVID-19: Secondary | ICD-10-CM | POA: Insufficient documentation

## 2019-07-21 DIAGNOSIS — Z01812 Encounter for preprocedural laboratory examination: Secondary | ICD-10-CM | POA: Diagnosis not present

## 2019-07-21 LAB — ABO/RH: ABO/RH(D): B POS

## 2019-07-21 LAB — SARS CORONAVIRUS 2 (TAT 6-24 HRS): SARS Coronavirus 2: POSITIVE — AB

## 2019-07-21 MED ORDER — IOHEXOL 300 MG/ML  SOLN
100.0000 mL | Freq: Once | INTRAMUSCULAR | Status: AC | PRN
  Administered 2019-07-21: 100 mL via INTRAVENOUS

## 2019-07-22 ENCOUNTER — Encounter: Payer: Self-pay | Admitting: Physician Assistant

## 2019-07-22 ENCOUNTER — Telehealth: Payer: Self-pay | Admitting: Unknown Physician Specialty

## 2019-07-22 ENCOUNTER — Encounter: Payer: Self-pay | Admitting: Gynecologic Oncology

## 2019-07-22 ENCOUNTER — Telehealth: Payer: Self-pay | Admitting: Physician Assistant

## 2019-07-22 NOTE — Progress Notes (Signed)
Called and spoke with the patient this afternoon. She was already aware that her Covid test was positive. I confirm that she has had no symptoms concerning for infection. I explained that our policy currently is that for patients who test positive and are scheduled for non-emergent surgeries, we recommend delaying surgery for at least 10 days. The patient was understanding of this and knows that someone from my office will call her tomo rrow to get a new surgery date set.

## 2019-07-22 NOTE — Telephone Encounter (Signed)
Called to discuss with Brittney Lamb about Covid symptoms and the use of bamlanivimab, a monoclonal antibody infusion for those with mild to moderate Covid symptoms and at a high risk of hospitalization.     Pt does not qualify for infusion therapy as she has asymptomatic infection. Isolation precautions discussed. Advised to contact back for consideration should she develop symptoms. Patient verbalized understanding.      Patient Active Problem List   Diagnosis Date Noted  . Endometrial cancer ([place]) 07/14/2019  . Postmenopausal bleeding 06/30/2019  . Hypercholesteremia 06/30/2019  . Hypertension 06/30/2019  . Cervical mass 06/30/2019  . Obesity, morbid, BMI 50 or higher ([place]) 06/30/2019

## 2019-07-22 NOTE — Progress Notes (Signed)
Called patient to discuss positive Covid test results. No answer. Let her know that I would call back again later in message I left.  Valarie Cones MD

## 2019-07-22 NOTE — Telephone Encounter (Signed)
Called to discuss with patient about Covid symptoms and the use of bamlanivimab or casirivimab/imdevimab, a monoclonal antibody infusion for those with mild to moderate Covid symptoms and at a high risk of hospitalization.  Pt is qualified for this infusion at the [facility] infusion center due to Age >55  With HTN, as well as active endometrial cancer, and morbid obesity. Looks like covid test was a screening test prior to upcoming surgery. Will have to see if she is having any symptoms.   Message left to call back. Also sent a MyChart message  Angelena Form PA-C  [facility]

## 2019-07-22 NOTE — Progress Notes (Signed)
Notified: Dr. Jeral Pinch, per In Box message  That patient's pre-procedure Covid test is +.  Patient can be rescheduled for procedure 10 days after + covid, if immunocompromised 20 days after + covid.  Patient will not require a repeat covid test if procedure rescheduled within next 90 days

## 2019-07-24 ENCOUNTER — Telehealth: Payer: Self-pay | Admitting: Oncology

## 2019-07-24 MED ORDER — DEXTROSE 5 % IV SOLN
3.0000 g | INTRAVENOUS | Status: AC
  Filled 2019-07-24: qty 3000

## 2019-07-24 NOTE — Telephone Encounter (Signed)
Called [name] and advised her that the surgery for tomorrow has been rescheduled for 08/15/19 due to her positive Covid test.  She verbalized agreement and said she does not have any symptoms of Covid (denies fever, cough) at this time. She is supposed to quarantine for 10-14 days.

## 2019-07-25 LAB — TYPE AND SCREEN
ABO/RH(D): B POS
Antibody Screen: NEGATIVE

## 2019-07-28 ENCOUNTER — Ambulatory Visit: Payer: 59 | Admitting: Gynecologic Oncology

## 2019-08-02 ENCOUNTER — Encounter: Payer: Self-pay | Admitting: *Deleted

## 2019-08-03 NOTE — Patient Instructions (Signed)
DUE TO COVID-19 ONLY ONE VISITOR IS ALLOWED TO COME WITH YOU AND STAY IN THE WAITING ROOM ONLY DURING PRE OP AND PROCEDURE DAY OF SURGERY. THE 1 VISITOR MAY VISIT WITH YOU AFTER SURGERY IN YOUR PRIVATE ROOM DURING VISITING HOURS ONLY!  YOU NEED TO HAVE A COVID 19 TEST ON_______ @_______ , THIS TEST MUST BE DONE BEFORE SURGERY, COME  [address] [place] , [id number].  ([place]) ONCE YOUR COVID TEST IS COMPLETED, PLEASE BEGIN THE QUARANTINE INSTRUCTIONS AS OUTLINED IN YOUR HANDOUT.                Brittney Lamb  08/03/2019   Your procedure is scheduled on:   08-15-19   Report to [facility] Main  Entrance   Report to admitting at        [place] AM     Call this number if you have problems the morning of surgery [phone]    Remember: Eat a light diet the day before surgery.  Examples including soups, broths, toast, yogurt, mashed potatoes.  Things to avoid include carbonated beverages (fizzy beverages), raw fruits and raw vegetables, or beans. After Midnight switch to a clear liquid diet until 0715 an then nothing by mouth  If your bowels are filled with gas, your surgeon will have difficulty visualizing your pelvic organs which increases your surgical risks.    CLEAR LIQUID DIET   Foods Allowed                                                                     Foods Excluded  Coffee and tea, regular and decaf                             liquids that you cannot  Plain Jell-O any favor except red or purple                                           see through such as: Fruit ices (not with fruit pulp)                                     milk, soups, orange juice  Iced Popsicles                                    All solid food                                    Cranberry, grape and apple juices Sports drinks like Gatorade Lightly seasoned clear broth or consume(fat free) Sugar, honey syrup  Sample Menu Breakfast                                 Lunch  Supper Cranberry juice                    Beef broth                            Chicken broth Jell-O                                     Grape juice                           Apple juice Coffee or tea                        Jell-O                                      Popsicle                                                Coffee or tea                        Coffee or tea  _____________________________________________________________________     BRUSH YOUR TEETH MORNING OF SURGERY AND RINSE YOUR MOUTH OUT, NO CHEWING GUM CANDY OR MINTS.     Take these medicines the morning of surgery with A SIP OF WATER: amlodiopine                                 You may not have any metal on your body including hair pins and              piercings  Do not wear jewelry, make-up, lotions, powders or perfumes, deodorant             Do not wear nail polish on your fingernails.  Do not shave  48 hours prior to surgery.     Do not bring valuables to the hospital. [place].  Contacts, dentures or bridgework may not be worn into surgery.      Patients discharged the day of surgery will not be allowed to drive home. IF YOU ARE HAVING SURGERY AND GOING HOME THE SAME DAY, YOU MUST HAVE AN ADULT TO DRIVE YOU HOME AND BE WITH YOU FOR 24 HOURS. YOU MAY GO HOME BY TAXI OR UBER OR ORTHERWISE, BUT AN ADULT MUST ACCOMPANY YOU HOME AND STAY WITH YOU FOR 24 HOURS.  Name and phone number of your driver:  Special Instructions: N/A              Please read over the following fact sheets you were given: _____________________________________________________________________          [facility] - Preparing for Surgery Before surgery, you can play an important role.  Because skin is not sterile, your skin needs to be as free of germs as possible.  You can reduce the number of germs on your skin  by washing with CHG (chlorahexidine  gluconate) soap before surgery.  CHG is an antiseptic cleaner which kills germs and bonds with the skin to continue killing germs even after washing. Please DO NOT use if you have an allergy to CHG or antibacterial soaps.  If your skin becomes reddened/irritated stop using the CHG and inform your nurse when you arrive at Short Stay. Do not shave (including legs and underarms) for at least 48 hours prior to the first CHG shower.  You may shave your face/neck. Please follow these instructions carefully:  1.  Shower with CHG Soap the night before surgery and the  morning of Surgery.  2.  If you choose to wash your hair, wash your hair first as usual with your  normal  shampoo.  3.  After you shampoo, rinse your hair and body thoroughly to remove the  shampoo.                           4.  Use CHG as you would any other liquid soap.  You can apply chg directly  to the skin and wash                       Gently with a scrungie or clean washcloth.  5.  Apply the CHG Soap to your body ONLY FROM THE NECK DOWN.   Do not use on face/ open                           Wound or open sores. Avoid contact with eyes, ears mouth and genitals (private parts).                       Wash face,  Genitals (private parts) with your normal soap.             6.  Wash thoroughly, paying special attention to the area where your surgery  will be performed.  7.  Thoroughly rinse your body with warm water from the neck down.  8.  DO NOT shower/wash with your normal soap after using and rinsing off  the CHG Soap.                9.  Pat yourself dry with a clean towel.            10.  Wear clean pajamas.            11.  Place clean sheets on your bed the night of your first shower and do not  sleep with pets. Day of Surgery : Do not apply any lotions/deodorants the morning of surgery.  Please wear clean clothes to the hospital/surgery center.  FAILURE TO FOLLOW THESE INSTRUCTIONS MAY RESULT IN THE CANCELLATION OF YOUR  SURGERY PATIENT SIGNATURE_________________________________  NURSE SIGNATURE__________________________________  ________________________________________________________________________   Brittney Lamb  An incentive spirometer is a tool that can help keep your lungs clear and active. This tool measures how well you are filling your lungs with each breath. Taking long deep breaths may help reverse or decrease the chance of developing breathing (pulmonary) problems (especially infection) following:  A long period of time when you are unable to move or be active. BEFORE THE PROCEDURE   If the spirometer includes an indicator to show your best effort, your nurse or respiratory therapist will set it to a desired goal.  If possible, sit  up straight or lean slightly forward. Try not to slouch.  Hold the incentive spirometer in an upright position. INSTRUCTIONS FOR USE  1. Sit on the edge of your bed if possible, or sit up as far as you can in bed or on a chair. 2. Hold the incentive spirometer in an upright position. 3. Breathe out normally. 4. Place the mouthpiece in your mouth and seal your lips tightly around it. 5. Breathe in slowly and as deeply as possible, raising the piston or the ball toward the top of the column. 6. Hold your breath for 3-5 seconds or for as long as possible. Allow the piston or ball to fall to the bottom of the column. 7. Remove the mouthpiece from your mouth and breathe out normally. 8. Rest for a few seconds and repeat Steps 1 through 7 at least 10 times every 1-2 hours when you are awake. Take your time and take a few normal breaths between deep breaths. 9. The spirometer may include an indicator to show your best effort. Use the indicator as a goal to work toward during each repetition. 10. After each set of 10 deep breaths, practice coughing to be sure your lungs are clear. If you have an incision (the cut made at the time of surgery), support your  incision when coughing by placing a pillow or rolled up towels firmly against it. Once you are able to get out of bed, walk around indoors and cough well. You may stop using the incentive spirometer when instructed by your caregiver.  RISKS AND COMPLICATIONS  Take your time so you do not get dizzy or light-headed.  If you are in pain, you may need to take or ask for pain medication before doing incentive spirometry. It is harder to take a deep breath if you are having pain. AFTER USE  Rest and breathe slowly and easily.  It can be helpful to keep track of a log of your progress. Your caregiver can provide you with a simple table to help with this. If you are using the spirometer at home, follow these instructions: [place] IF:   You are having difficultly using the spirometer.  You have trouble using the spirometer as often as instructed.  Your pain medication is not giving enough relief while using the spirometer.  You develop fever of 100.5 F (38.1 C) or higher. SEEK IMMEDIATE MEDICAL CARE IF:   You cough up bloody sputum that had not been present before.  You develop fever of 102 F (38.9 C) or greater.  You develop worsening pain at or near the incision site. MAKE SURE YOU:   Understand these instructions.  Will watch your condition.  Will get help right away if you are not doing well or get worse. Document Released: 09/14/2006 Document Revised: 07/27/2011 Document Reviewed: 11/15/2006 ExitCare Patient Information 2014 ExitCare, [place].   ________________________________________________________________________  WHAT IS A BLOOD TRANSFUSION? Blood Transfusion Information  A transfusion is the replacement of blood or some of its parts. Blood is made up of multiple cells which provide different functions.  Red blood cells carry oxygen and are used for blood loss replacement.  White blood cells fight against infection.  Platelets control bleeding.  Plasma  helps clot blood.  Other blood products are available for specialized needs, such as hemophilia or other clotting disorders. BEFORE THE TRANSFUSION  Who gives blood for transfusions?   Healthy volunteers who are fully evaluated to make sure their blood is safe. This is  blood bank blood. Transfusion therapy is the safest it has ever been in the practice of medicine. Before blood is taken from a donor, a complete history is taken to make sure that person has no history of diseases nor engages in risky social behavior (examples are intravenous drug use or sexual activity with multiple partners). The donor's travel history is screened to minimize risk of transmitting infections, such as malaria. The donated blood is tested for signs of infectious diseases, such as HIV and hepatitis. The blood is then tested to be sure it is compatible with you in order to minimize the chance of a transfusion reaction. If you or a relative donates blood, this is often done in anticipation of surgery and is not appropriate for emergency situations. It takes many days to process the donated blood. RISKS AND COMPLICATIONS Although transfusion therapy is very safe and saves many lives, the main dangers of transfusion include:   Getting an infectious disease.  Developing a transfusion reaction. This is an allergic reaction to something in the blood you were given. Every precaution is taken to prevent this. The decision to have a blood transfusion has been considered carefully by your caregiver before blood is given. Blood is not given unless the benefits outweigh the risks. AFTER THE TRANSFUSION  Right after receiving a blood transfusion, you will usually feel much better and more energetic. This is especially true if your red blood cells have gotten low (anemic). The transfusion raises the level of the red blood cells which carry oxygen, and this usually causes an energy increase.  The nurse administering the transfusion  will monitor you carefully for complications. HOME CARE INSTRUCTIONS  No special instructions are needed after a transfusion. You may find your energy is better. Speak with your caregiver about any limitations on activity for underlying diseases you may have. SEEK MEDICAL CARE IF:   Your condition is not improving after your transfusion.  You develop redness or irritation at the intravenous (IV) site. SEEK IMMEDIATE MEDICAL CARE IF:  Any of the following symptoms occur over the next 12 hours:  Shaking chills.  You have a temperature by mouth above 102 F (38.9 C), not controlled by medicine.  Chest, back, or muscle pain.  People around you feel you are not acting correctly or are confused.  Shortness of breath or difficulty breathing.  Dizziness and fainting.  You get a rash or develop hives.  You have a decrease in urine output.  Your urine turns a dark color or changes to pink, red, or brown. Any of the following symptoms occur over the next 10 days:  You have a temperature by mouth above 102 F (38.9 C), not controlled by medicine.  Shortness of breath.  Weakness after normal activity.  The white part of the eye turns yellow (jaundice).  You have a decrease in the amount of urine or are urinating less often.  Your urine turns a dark color or changes to pink, red, or brown. Document Released: 05/01/2000 Document Revised: 07/27/2011 Document Reviewed: 12/19/2007 [facility] Patient Information 2014 [place], [place].  _______________________________________________________________________

## 2019-08-03 NOTE — Progress Notes (Signed)
PCP - [name] Cardiologist -   Chest x-ray -  EKG - 07-20-19 EPIC Stress Test -  ECHO -  Cardiac Cath -   Sleep Study -  CPAP -   Fasting Blood Sugar -  Checks Blood Sugar _____ times a day  Blood Thinner Instructions: Aspirin Instructions: Last Dose:  Anesthesia review: COVID + 07-21-19 EPIC  Patient denies shortness of breath, fever, cough and chest pain at PAT appointment  NONE   Patient verbalized understanding of instructions that were given to them at the PAT appointment. Patient was also instructed that they will need to review over the PAT instructions again at home before surgery.

## 2019-08-09 ENCOUNTER — Encounter (HOSPITAL_COMMUNITY): Payer: Self-pay

## 2019-08-09 ENCOUNTER — Encounter (HOSPITAL_COMMUNITY)
Admission: RE | Admit: 2019-08-09 | Discharge: 2019-08-09 | Disposition: A | Discharge status: Home / Self Care | Payer: 59 | Source: Ambulatory Visit | Attending: Gynecologic Oncology | Admitting: Gynecologic Oncology

## 2019-08-09 ENCOUNTER — Other Ambulatory Visit: Payer: Self-pay

## 2019-08-09 DIAGNOSIS — Z01812 Encounter for preprocedural laboratory examination: Secondary | ICD-10-CM | POA: Insufficient documentation

## 2019-08-09 DIAGNOSIS — Z8616 Personal history of COVID-19: Secondary | ICD-10-CM | POA: Insufficient documentation

## 2019-08-09 LAB — CBC
HCT: 39.2 % (ref 36.0–46.0)
Hemoglobin: 12.2 g/dL (ref 12.0–15.0)
MCH: 28.3 pg (ref 26.0–34.0)
MCHC: 31.1 g/dL (ref 30.0–36.0)
MCV: 91 fL (ref 80.0–100.0)
Platelets: 295 10*3/uL (ref 150–400)
RBC: 4.31 MIL/uL (ref 3.87–5.11)
RDW: 14.1 % (ref 11.5–15.5)
WBC: 10.3 10*3/uL (ref 4.0–10.5)
nRBC: 0 % (ref 0.0–0.2)

## 2019-08-09 LAB — BASIC METABOLIC PANEL
Anion gap: 8 (ref 5–15)
BUN: 17 mg/dL (ref 8–23)
CO2: 29 mmol/L (ref 22–32)
Calcium: 9.2 mg/dL (ref 8.9–10.3)
Chloride: 102 mmol/L (ref 98–111)
Creatinine, Ser: 0.91 mg/dL (ref 0.44–1.00)
GFR calc Af Amer: >60 mL/min (ref >60)
GFR calc non Af Amer: >60 mL/min (ref >60)
Glucose, Bld: 127 mg/dL — ABNORMAL HIGH (ref 70–99)
Potassium: 3.2 mmol/L — ABNORMAL LOW (ref 3.5–5.1)
Sodium: 139 mmol/L (ref 135–145)

## 2019-08-11 ENCOUNTER — Ambulatory Visit: Payer: 59 | Admitting: Gynecologic Oncology

## 2019-08-14 ENCOUNTER — Telehealth: Payer: Self-pay

## 2019-08-14 NOTE — Telephone Encounter (Signed)
Pt aware of her arrival time to [facility] admitting moved up to 0530 to morrow as her case was moved up to 0730. Pt understands instructions and has no questions or concerns at this time.

## 2019-08-14 NOTE — Progress Notes (Signed)
Pt. Aware of time change and will arrive at 0530 clear liquids until 0415 am then nothing by mouth

## 2019-08-15 ENCOUNTER — Ambulatory Visit (HOSPITAL_COMMUNITY): Payer: 59 | Admitting: Physician Assistant

## 2019-08-15 ENCOUNTER — Encounter (HOSPITAL_COMMUNITY): Payer: Self-pay | Admitting: Gynecologic Oncology

## 2019-08-15 ENCOUNTER — Encounter (HOSPITAL_COMMUNITY)
Admission: RE | Disposition: A | Discharge status: Home / Self Care | Payer: Self-pay | Source: Home / Self Care | Attending: Gynecologic Oncology

## 2019-08-15 ENCOUNTER — Ambulatory Visit (HOSPITAL_COMMUNITY): Payer: 59 | Admitting: Anesthesiology

## 2019-08-15 ENCOUNTER — Other Ambulatory Visit: Payer: Self-pay

## 2019-08-15 ENCOUNTER — Ambulatory Visit (HOSPITAL_COMMUNITY)
Admission: RE | Admit: 2019-08-15 | Discharge: 2019-08-15 | Disposition: A | Discharge status: Home / Self Care | Payer: 59 | Attending: Gynecologic Oncology | Admitting: Gynecologic Oncology

## 2019-08-15 DIAGNOSIS — Z888 Allergy status to other drugs, medicaments and biological substances status: Secondary | ICD-10-CM | POA: Diagnosis not present

## 2019-08-15 DIAGNOSIS — I10 Essential (primary) hypertension: Secondary | ICD-10-CM | POA: Diagnosis not present

## 2019-08-15 DIAGNOSIS — M1990 Unspecified osteoarthritis, unspecified site: Secondary | ICD-10-CM | POA: Insufficient documentation

## 2019-08-15 DIAGNOSIS — C541 Malignant neoplasm of endometrium: Secondary | ICD-10-CM | POA: Insufficient documentation

## 2019-08-15 DIAGNOSIS — N950 Postmenopausal bleeding: Secondary | ICD-10-CM | POA: Insufficient documentation

## 2019-08-15 HISTORY — PX: SENTINEL NODE BIOPSY: SHX6608

## 2019-08-15 HISTORY — PX: ROBOTIC ASSISTED TOTAL HYSTERECTOMY WITH BILATERAL SALPINGO OOPHERECTOMY: SHX6086

## 2019-08-15 LAB — TYPE AND SCREEN
ABO/RH(D): B POS
Antibody Screen: NEGATIVE

## 2019-08-15 SURGERY — HYSTERECTOMY, TOTAL, ROBOT-ASSISTED, LAPAROSCOPIC, WITH BILATERAL SALPINGO-OOPHORECTOMY
Anesthesia: General

## 2019-08-15 MED ORDER — PROMETHAZINE HCL 25 MG/ML IJ SOLN: 6.2500 mg | INTRAMUSCULAR | Status: DC | PRN

## 2019-08-15 MED ORDER — PROPOFOL 10 MG/ML IV BOLUS
INTRAVENOUS | Status: DC | PRN
  Administered 2019-08-15: 120 mg via INTRAVENOUS

## 2019-08-15 MED ORDER — SUCCINYLCHOLINE CHLORIDE 200 MG/10ML IV SOSY
PREFILLED_SYRINGE | INTRAVENOUS | Status: AC
  Filled 2019-08-15: qty 10

## 2019-08-15 MED ORDER — LIDOCAINE 2% (20 MG/ML) 5 ML SYRINGE
INTRAMUSCULAR | Status: DC | PRN
  Administered 2019-08-15: 40 mg via INTRAVENOUS

## 2019-08-15 MED ORDER — DEXTROSE 5 % IV SOLN
3.0000 g | Freq: Once | INTRAVENOUS | Status: DC
  Filled 2019-08-15: qty 3000

## 2019-08-15 MED ORDER — ACETAMINOPHEN 160 MG/5ML PO SOLN: 325.0000 mg | Freq: Once | ORAL | Status: DC | PRN

## 2019-08-15 MED ORDER — DEXAMETHASONE SODIUM PHOSPHATE 10 MG/ML IJ SOLN
INTRAMUSCULAR | Status: DC | PRN
  Administered 2019-08-15: 10 mg via INTRAVENOUS

## 2019-08-15 MED ORDER — FENTANYL CITRATE (PF) 100 MCG/2ML IJ SOLN
INTRAMUSCULAR | Status: AC
  Filled 2019-08-15: qty 2

## 2019-08-15 MED ORDER — PROPOFOL 10 MG/ML IV BOLUS
INTRAVENOUS | Status: AC
  Filled 2019-08-15: qty 20

## 2019-08-15 MED ORDER — MIDAZOLAM HCL 2 MG/2ML IJ SOLN
INTRAMUSCULAR | Status: AC
  Filled 2019-08-15: qty 2

## 2019-08-15 MED ORDER — SCOPOLAMINE 1 MG/3DAYS TD PT72
1.0000 | MEDICATED_PATCH | TRANSDERMAL | Status: DC
  Administered 2019-08-15: 1.5 mg via TRANSDERMAL
  Filled 2019-08-15: qty 1

## 2019-08-15 MED ORDER — FENTANYL CITRATE (PF) 250 MCG/5ML IJ SOLN
INTRAMUSCULAR | Status: AC
  Filled 2019-08-15: qty 5

## 2019-08-15 MED ORDER — LACTATED RINGERS IV SOLN: INTRAVENOUS | Status: DC

## 2019-08-15 MED ORDER — BUPIVACAINE HCL 0.25 % IJ SOLN
INTRAMUSCULAR | Status: DC | PRN
  Administered 2019-08-15: 33 mL

## 2019-08-15 MED ORDER — SODIUM CHLORIDE 0.9% FLUSH: 3.0000 mL | Freq: Two times a day (BID) | INTRAVENOUS | Status: DC

## 2019-08-15 MED ORDER — STERILE WATER FOR IRRIGATION IR SOLN
Status: DC | PRN
  Administered 2019-08-15: 1000 mL

## 2019-08-15 MED ORDER — LIDOCAINE HCL 2 % IJ SOLN
INTRAMUSCULAR | Status: AC
  Filled 2019-08-15: qty 20

## 2019-08-15 MED ORDER — DEXAMETHASONE SODIUM PHOSPHATE 4 MG/ML IJ SOLN: 4.0000 mg | INTRAMUSCULAR | Status: DC

## 2019-08-15 MED ORDER — CELECOXIB 200 MG PO CAPS
400.0000 mg | ORAL_CAPSULE | ORAL | Status: AC
  Administered 2019-08-15: 06:00:00 400 mg via ORAL
  Filled 2019-08-15: qty 2

## 2019-08-15 MED ORDER — STERILE WATER FOR INJECTION IJ SOLN
INTRAMUSCULAR | Status: AC
  Filled 2019-08-15: qty 10

## 2019-08-15 MED ORDER — BUPIVACAINE HCL 0.25 % IJ SOLN
INTRAMUSCULAR | Status: AC
  Filled 2019-08-15: qty 1

## 2019-08-15 MED ORDER — LACTATED RINGERS IR SOLN
Status: DC | PRN
  Administered 2019-08-15: 1000 mL

## 2019-08-15 MED ORDER — KETAMINE HCL 10 MG/ML IJ SOLN
INTRAMUSCULAR | Status: AC
  Filled 2019-08-15: qty 1

## 2019-08-15 MED ORDER — LACTATED RINGERS IV SOLN: INTRAVENOUS | Status: DC | PRN

## 2019-08-15 MED ORDER — SUGAMMADEX SODIUM 200 MG/2ML IV SOLN
INTRAVENOUS | Status: DC | PRN
  Administered 2019-08-15: 300 mg via INTRAVENOUS

## 2019-08-15 MED ORDER — BUPIVACAINE LIPOSOME 1.3 % IJ SUSP
20.0000 mL | Freq: Once | INTRAMUSCULAR | Status: AC
  Administered 2019-08-15: 20 mL
  Filled 2019-08-15: qty 20

## 2019-08-15 MED ORDER — DEXTROSE 5 % IV SOLN
3.0000 g | Freq: Once | INTRAVENOUS | Status: AC
  Administered 2019-08-15 (×2): 3 g via INTRAVENOUS
  Filled 2019-08-15: qty 3000

## 2019-08-15 MED ORDER — FENTANYL CITRATE (PF) 250 MCG/5ML IJ SOLN
INTRAMUSCULAR | Status: DC | PRN
  Administered 2019-08-15 (×3): 50 ug via INTRAVENOUS
  Administered 2019-08-15: 100 ug via INTRAVENOUS
  Administered 2019-08-15 (×2): 50 ug via INTRAVENOUS

## 2019-08-15 MED ORDER — PHENYLEPHRINE 40 MCG/ML (10ML) SYRINGE FOR IV PUSH (FOR BLOOD PRESSURE SUPPORT)
PREFILLED_SYRINGE | INTRAVENOUS | Status: DC | PRN
  Administered 2019-08-15 (×4): 80 ug via INTRAVENOUS

## 2019-08-15 MED ORDER — ROCURONIUM BROMIDE 10 MG/ML (PF) SYRINGE
PREFILLED_SYRINGE | INTRAVENOUS | Status: AC
  Filled 2019-08-15: qty 10

## 2019-08-15 MED ORDER — LIDOCAINE 2% (20 MG/ML) 5 ML SYRINGE
INTRAMUSCULAR | Status: AC
  Filled 2019-08-15: qty 5

## 2019-08-15 MED ORDER — PHENYLEPHRINE 40 MCG/ML (10ML) SYRINGE FOR IV PUSH (FOR BLOOD PRESSURE SUPPORT)
PREFILLED_SYRINGE | INTRAVENOUS | Status: AC
  Filled 2019-08-15: qty 10

## 2019-08-15 MED ORDER — MEPERIDINE HCL 50 MG/ML IJ SOLN: 6.2500 mg | INTRAMUSCULAR | Status: DC | PRN

## 2019-08-15 MED ORDER — LIDOCAINE 2% (20 MG/ML) 5 ML SYRINGE
INTRAMUSCULAR | Status: DC | PRN
  Administered 2019-08-15: 1.5 mg/kg/h via INTRAVENOUS

## 2019-08-15 MED ORDER — DEXAMETHASONE SODIUM PHOSPHATE 10 MG/ML IJ SOLN
INTRAMUSCULAR | Status: AC
  Filled 2019-08-15: qty 1

## 2019-08-15 MED ORDER — ONDANSETRON HCL 4 MG/2ML IJ SOLN
INTRAMUSCULAR | Status: DC | PRN
  Administered 2019-08-15: 4 mg via INTRAVENOUS

## 2019-08-15 MED ORDER — ONDANSETRON HCL 4 MG/2ML IJ SOLN
INTRAMUSCULAR | Status: AC
  Filled 2019-08-15: qty 2

## 2019-08-15 MED ORDER — 0.9 % SODIUM CHLORIDE (POUR BTL) OPTIME
TOPICAL | Status: DC | PRN
  Administered 2019-08-15: 1000 mL

## 2019-08-15 MED ORDER — FENTANYL CITRATE (PF) 100 MCG/2ML IJ SOLN: 25.0000 ug | INTRAMUSCULAR | Status: DC | PRN

## 2019-08-15 MED ORDER — HEPARIN SODIUM (PORCINE) 5000 UNIT/ML IJ SOLN
5000.0000 [IU] | INTRAMUSCULAR | Status: AC
  Administered 2019-08-15: 5000 [IU] via SUBCUTANEOUS
  Filled 2019-08-15: qty 1

## 2019-08-15 MED ORDER — STERILE WATER FOR INJECTION IJ SOLN
INTRAMUSCULAR | Status: DC | PRN
  Administered 2019-08-15: 10 mL

## 2019-08-15 MED ORDER — GABAPENTIN 300 MG PO CAPS
300.0000 mg | ORAL_CAPSULE | ORAL | Status: AC
  Administered 2019-08-15: 300 mg via ORAL
  Filled 2019-08-15: qty 1

## 2019-08-15 MED ORDER — MIDAZOLAM HCL 2 MG/2ML IJ SOLN
INTRAMUSCULAR | Status: DC | PRN
  Administered 2019-08-15: 2 mg via INTRAVENOUS

## 2019-08-15 MED ORDER — KETAMINE HCL 10 MG/ML IJ SOLN
INTRAMUSCULAR | Status: DC | PRN
  Administered 2019-08-15: 50 mg via INTRAVENOUS

## 2019-08-15 MED ORDER — ACETAMINOPHEN 325 MG PO TABS: 325.0000 mg | ORAL_TABLET | Freq: Once | ORAL | Status: DC | PRN

## 2019-08-15 MED ORDER — SUGAMMADEX SODIUM 500 MG/5ML IV SOLN
INTRAVENOUS | Status: AC
  Filled 2019-08-15: qty 5

## 2019-08-15 MED ORDER — ACETAMINOPHEN 10 MG/ML IV SOLN: 1000.0000 mg | Freq: Once | INTRAVENOUS | Status: DC | PRN

## 2019-08-15 MED ORDER — ACETAMINOPHEN 500 MG PO TABS
1000.0000 mg | ORAL_TABLET | ORAL | Status: AC
  Administered 2019-08-15: 1000 mg via ORAL
  Filled 2019-08-15: qty 2

## 2019-08-15 MED ORDER — ROCURONIUM BROMIDE 10 MG/ML (PF) SYRINGE
PREFILLED_SYRINGE | INTRAVENOUS | Status: DC | PRN
  Administered 2019-08-15 (×2): 10 mg via INTRAVENOUS
  Administered 2019-08-15: 15 mg via INTRAVENOUS
  Administered 2019-08-15: 65 mg via INTRAVENOUS
  Administered 2019-08-15 (×3): 10 mg via INTRAVENOUS

## 2019-08-15 SURGICAL SUPPLY — 72 items
ADH SKN CLS APL DERMABOND .7 (GAUZE/BANDAGES/DRESSINGS) ×2
AGENT HMST KT MTR STRL THRMB (HEMOSTASIS) ×2
APL ESCP 34 STRL LF DISP (HEMOSTASIS) ×2
APPLICATOR SURGIFLO ENDO (HEMOSTASIS) ×3 IMPLANT
BACTOSHIELD CHG 4% 4OZ (MISCELLANEOUS) ×1
BAG LAPAROSCOPIC 12 15 PORT 16 (BASKET) ×2 IMPLANT
BAG RETRIEVAL 12/15 (BASKET) ×3
BAG SPEC RTRVL LRG 6X4 10 (ENDOMECHANICALS) ×4
BLADE SURG SZ10 CARB STEEL (BLADE) ×3 IMPLANT
COVER BACK TABLE 60X90IN (DRAPES) ×3 IMPLANT
COVER TIP SHEARS 8 DVNC (MISCELLANEOUS) ×2 IMPLANT
COVER TIP SHEARS 8MM DA VINCI (MISCELLANEOUS) ×3
COVER WAND RF STERILE (DRAPES) IMPLANT
DECANTER SPIKE VIAL GLASS SM (MISCELLANEOUS) ×6 IMPLANT
DERMABOND ADVANCED (GAUZE/BANDAGES/DRESSINGS) ×1
DERMABOND ADVANCED .7 DNX12 (GAUZE/BANDAGES/DRESSINGS) ×2 IMPLANT
DRAPE ARM DVNC X/XI (DISPOSABLE) ×8 IMPLANT
DRAPE COLUMN DVNC XI (DISPOSABLE) ×2 IMPLANT
DRAPE DA VINCI XI ARM (DISPOSABLE) ×12
DRAPE DA VINCI XI COLUMN (DISPOSABLE) ×3
DRAPE SHEET LG 3/4 BI-LAMINATE (DRAPES) ×3 IMPLANT
DRAPE SURG IRRIG POUCH 19X23 (DRAPES) ×3 IMPLANT
DRSG OPSITE POSTOP 4X6 (GAUZE/BANDAGES/DRESSINGS) ×3 IMPLANT
DRSG OPSITE POSTOP 4X8 (GAUZE/BANDAGES/DRESSINGS) IMPLANT
ELECT PENCIL ROCKER SW 15FT (MISCELLANEOUS) ×3 IMPLANT
ELECT REM PT RETURN 15FT ADLT (MISCELLANEOUS) ×3 IMPLANT
GAUZE 4X4 16PLY RFD (DISPOSABLE) ×3 IMPLANT
GLOVE BIO SURGEON STRL SZ 6 (GLOVE) ×12 IMPLANT
GLOVE BIO SURGEON STRL SZ 6.5 (GLOVE) ×12 IMPLANT
GLOVE BIOGEL PI IND STRL 7.0 (GLOVE) ×4 IMPLANT
GLOVE BIOGEL PI INDICATOR 7.0 (GLOVE) ×2
GOWN STRL REUS W/ TWL LRG LVL3 (GOWN DISPOSABLE) ×12 IMPLANT
GOWN STRL REUS W/TWL LRG LVL3 (GOWN DISPOSABLE) ×18
HOLDER FOLEY CATH W/STRAP (MISCELLANEOUS) IMPLANT
IRRIG SUCT STRYKERFLOW 2 WTIP (MISCELLANEOUS) ×3
IRRIGATION SUCT STRKRFLW 2 WTP (MISCELLANEOUS) ×2 IMPLANT
KIT PROCEDURE DA VINCI SI (MISCELLANEOUS) ×3
KIT PROCEDURE DVNC SI (MISCELLANEOUS) ×2 IMPLANT
KIT TURNOVER KIT A (KITS) IMPLANT
MANIPULATOR UTERINE 4.5 ZUMI (MISCELLANEOUS) ×3 IMPLANT
NEEDLE HYPO 21X1.5 SAFETY (NEEDLE) ×3 IMPLANT
NEEDLE SPNL 18GX3.5 QUINCKE PK (NEEDLE) ×3 IMPLANT
OBTURATOR OPTICAL STANDARD 8MM (TROCAR) ×3
OBTURATOR OPTICAL STND 8 DVNC (TROCAR) ×2
OBTURATOR OPTICALSTD 8 DVNC (TROCAR) ×2 IMPLANT
PACK ROBOT GYN CUSTOM WL (TRAY / TRAY PROCEDURE) ×3 IMPLANT
PAD POSITIONING PINK XL (MISCELLANEOUS) ×3 IMPLANT
PENCIL SMOKE EVACUATOR (MISCELLANEOUS) IMPLANT
PORT ACCESS TROCAR AIRSEAL 12 (TROCAR) ×2 IMPLANT
PORT ACCESS TROCAR AIRSEAL 5M (TROCAR) ×1
POUCH SPECIMEN RETRIEVAL 10MM (ENDOMECHANICALS) ×6 IMPLANT
SCRUB CHG 4% DYNA-HEX 4OZ (MISCELLANEOUS) ×2 IMPLANT
SEAL CANN UNIV 5-8 DVNC XI (MISCELLANEOUS) ×8 IMPLANT
SEAL XI 5MM-8MM UNIVERSAL (MISCELLANEOUS) ×12
SET TRI-LUMEN FLTR TB AIRSEAL (TUBING) ×3 IMPLANT
SPONGE LAP 18X18 RF (DISPOSABLE) ×3 IMPLANT
SURGIFLO W/THROMBIN 8M KIT (HEMOSTASIS) ×3 IMPLANT
SUT MNCRL AB 4-0 PS2 18 (SUTURE) IMPLANT
SUT PDS AB 1 TP1 96 (SUTURE) ×3 IMPLANT
SUT VIC AB 0 CT1 27 (SUTURE)
SUT VIC AB 0 CT1 27XBRD ANTBC (SUTURE) IMPLANT
SUT VIC AB 2-0 CT1 27 (SUTURE) ×3
SUT VIC AB 2-0 CT1 TAPERPNT 27 (SUTURE) ×2 IMPLANT
SUT VICRYL 4-0 PS2 18IN ABS (SUTURE) ×6 IMPLANT
SYR BULB IRRIGATION 50ML (SYRINGE) ×3 IMPLANT
TOWEL OR NON WOVEN STRL DISP B (DISPOSABLE) ×3 IMPLANT
TRAP SPECIMEN MUCOUS 40CC (MISCELLANEOUS) IMPLANT
TRAY FOLEY MTR SLVR 16FR STAT (SET/KITS/TRAYS/PACK) ×3 IMPLANT
TROCAR XCEL NON-BLD 5MMX100MML (ENDOMECHANICALS) IMPLANT
UNDERPAD 30X36 HEAVY ABSORB (UNDERPADS AND DIAPERS) ×3 IMPLANT
WATER STERILE IRR 1000ML POUR (IV SOLUTION) ×3 IMPLANT
YANKAUER SUCT BULB TIP 10FT TU (MISCELLANEOUS) ×3 IMPLANT

## 2019-08-15 NOTE — Op Note (Signed)
OPERATIVE NOTE  Pre-operative Diagnosis: endometrial cancer, high grade  Post-operative Diagnosis: same  Operation: Robotic-assisted laparoscopic total hysterectomy with bilateral salpingoophorectomy, SLN injection, bilateral pelvic LND, mini-lap for specimen removal Modifier 22: significant obesity causing difficulty visualizing anatomy, increasing OR time   Surgeon: Jeral Pinch MD  Assistant Surgeon: Lahoma Crocker MD (an MD assistant was necessary for tissue manipulation, management of robotic instrumentation, retraction and positioning due to the complexity of the case and hospital policies).   Anesthesia: GET  Urine Output: 375 cc  Operative Findings: On EUA, 10-12cm moderately mobile uterus. Tumor noted to be extruding from cervical os. On intra-abdominal entry, normal appearing liver edge and diaphragm. Normal stomach. Small bowel with some adhesions to the right sidewall, appendix normal in appearance but adherent to the right IP ligament. Mesentery of the small bowel quite short, limiting small bowel mobility and visualization of PA lymph nodes. Sigmoid with some physiologic adhesions to the right sidewall and pelvic sidewall. Uterus 12-14cm with 3cm left fundal and calcified fibroid; uterus overall boggy. Bilateral adnexa normal appearing. Mapping unsuccessful bilaterally. Some mildly enlarged lymph nodes bilaterally in obturator space. Unable to assess para-aortic LNs given mesentery and obesity. No intra-abdominal or pelvic evidence of disease at the end of surgery.  Estimated Blood Loss:  less than 100 mL      Total IV Fluids: 2000 ml         Specimens: uterus, cervix, bilateral tubes and ovaries, pelvic LNDs         Complications:  None apparent; patient tolerated the procedure well.         Disposition: PACU - hemodynamically stable.  Procedure Details  The patient was seen in the Holding Room. The risks, benefits, complications, treatment options, and expected  outcomes were discussed with the patient.  The patient concurred with the proposed plan, giving informed consent.  The site of surgery properly noted/marked. The patient was identified as Brittney Lamb and the procedure verified as a Robotic-assisted hysterectomy with bilateral salpingo oophorectomy with SLN biopsy.   After induction of anesthesia, the patient was draped and prepped in the usual sterile manner. Patient was placed in supine position after anesthesia and draped and prepped in the usual sterile manner as follows: Her arms were tucked to her side with all appropriate precautions.  The shoulders were stabilized with padded shoulder blocks applied to the acromium processes.  The patient was placed in the semi-lithotomy position in [place].  The perineum and vagina were prepped with CholoraPrep. The patient was draped after the CholoraPrep had been allowed to dry for 3 minutes.  A Time Out was held and the above information confirmed.  The urethra was prepped with Betadine. Foley catheter was placed.  A sterile speculum was placed in the vagina.  The cervix was grasped with a single-tooth tenaculum. 2mg  total of ICG was injected into the cervical stroma at 2 and 9 o'clock with 1cc injected at a 1cm and 32mm depth (concentration 0.5mg /ml) in all locations. The cervix was dilated with Kennon Rounds dilators.  The ZUMI uterine manipulator with a medium colpotomizer ring was placed without difficulty.  OG tube placement was confirmed and to suction.   Next, a 10 mm skin incision was made 1 cm below the subcostal margin in the midclavicular line.  The 5 mm Optiview port and scope was used for direct entry.  Opening pressure was under 10 mm CO2.  The abdomen was insufflated and the findings were noted as above.   At  this point and all points during the procedure, the patient's intra-abdominal pressure did not exceed 15 mmHg. Next, an 8 mm skin incision was made superior to the umbilicus and a right and left  port were placed about 8 cm lateral to the robot port on the right and left side.  A fourth arm was placed on the right.  The 5 mm assist trocar was exchanged for a 10-12 mm port. All ports were placed under direct visualization.  The patient was placed in steep Trendelenburg.  Bowel was attempted to be folded away into the upper abdomen.  The robot was docked in the normal manner.  A combination of sharp dissection and electrocautery was used to lyse adhesions of the sigmoid mesentery and cecum/appendix to the right sidewall and IP ligament. Sigmoid mesentery was dissected from the left sidewall with sharp dissection.  The right and left peritoneum were opened parallel to the IP ligament to open the retroperitoneal spaces bilaterally. The round ligaments were transected. The SLN mapping was performed in bilateral pelvic basins. After identifying the ureters, the para rectal and paravesical spaces were opened up entirely with careful dissection below the level of the ureters bilaterally and to the depth of the uterine artery origin in order to skeletonize the uterine "web" and ensure visualization of all parametrial channels. Mapping was unsuccessful bilaterally.  The hysterectomy was started.  The ureter was again noted to be on the medial leaf of the broad ligament.  The peritoneum above the ureter was incised and stretched and the infundibulopelvic ligament was skeletonized, cauterized and cut.  The posterior peritoneum was taken down to the level of the KOH ring.  The anterior peritoneum was also taken down.  The bladder flap was created to the level of the KOH ring.  The uterine artery on the right side was skeletonized, cauterized and cut in the normal manner.  A similar procedure was performed on the left.  The colpotomy was made and the uterus, cervix, bilateral ovaries and tubes. Given size of the uterus, the specimen was placed in a 26mm Endocatch bag to be removed through a mini-lap. Pedicles were  inspected and excellent hemostasis was achieved.    A pelvic lymph dissection was performed on the right with the following borders: proximally the bifurcation of the common iliac, distally the circumflex iliac vein, laterally the genitofemoral nerve, the medial border was the superior vesicle artery and the deep border was the obturator nerve. All lymphatic tissue was removed and sent to Pathology. A similar procedure was performed on the contralateral side.   The colpotomy at the vaginal cuff was closed with Vicryl on a CT1 needle in running manner.  Irrigation was used and excellent hemostasis was achieved.  FloSeal was placed in the beds of bilateral sidewalls. Robotic instruments were removed under direct visulaization.  The robot was undocked.   With the abdomen insufflated, the midline incision was extended to a length of 6-7cm with a scalpel and the incision carried down to the fascia with electrocautery. The fascia was extended as was the peritoneum. All ports were removed. The Endocatch bag containing the uterine specimen was removed.   The fascia was closed with looped #0 PDS. The subcutaneous tissue was irrigated and Exparel was injected. The subcutaneous tissue was closed with 2-0 Vicryl and the skin closed with 4-0 Monocryl in subcuticular fashion.  The fascia at the 10-12 mm port was closed with 0 Vicryl on a UR-5 needle.  The subcuticular tissue was closed  with 4-0 Vicryl and the skin was closed with 4-0 Monocryl in a subcuticular manner.  Dermabond was applied.    The vagina was swabbed with  minimal bleeding noted.   All sponge, lap and needle counts were correct x  3. Foley catheter was removed.  The patient was transferred to the recovery room in stable condition.  Jeral Pinch, MD

## 2019-08-15 NOTE — Anesthesia Preprocedure Evaluation (Addendum)
Anesthesia Evaluation  Patient identified by MRN, date of birth, ID band Patient awake    Reviewed: Allergy & Precautions, NPO status , Patient's Chart, lab work & pertinent test results  Airway Mallampati: I  TM Distance: >3 FB Neck ROM: Full    Dental  (+) Teeth Intact, Dental Advisory Given   Pulmonary neg pulmonary ROS,    breath sounds clear to auscultation       Cardiovascular hypertension, Pt. on medications  Rhythm:Regular Rate:Normal     Neuro/Psych negative neurological ROS  negative psych ROS   GI/Hepatic Neg liver ROS, GERD  ,  Endo/Other  negative endocrine ROS  Renal/GU negative Renal ROS     Musculoskeletal  (+) Arthritis ,   Abdominal (+) + obese,   Peds  Hematology negative hematology ROS (+)   Anesthesia Other Findings   Reproductive/Obstetrics                            Lab Results  Component Value Date   WBC 10.3 08/09/2019   HGB 12.2 08/09/2019   HCT 39.2 08/09/2019   MCV 91.0 08/09/2019   PLT 295 08/09/2019     Anesthesia Physical Anesthesia Plan  ASA: III  Anesthesia Plan: General   Post-op Pain Management:    Induction: Intravenous  PONV Risk Score and Plan: 4 or greater and Ondansetron, Dexamethasone and Midazolam  Airway Management Planned: Oral ETT  Additional Equipment: None  Intra-op Plan:   Post-operative Plan: Extubation in OR  Informed Consent: I have reviewed the patients History and Physical, chart, labs and discussed the procedure including the risks, benefits and alternatives for the proposed anesthesia with the patient or authorized representative who has indicated his/her understanding and acceptance.     Dental advisory given  Plan Discussed with: CRNA  Anesthesia Plan Comments:        Anesthesia Quick Evaluation

## 2019-08-15 NOTE — H&P (Signed)
H&P  Assessment/Plan:  Suspected clinical stage II EMCA, high-grade, now >2wks s/p positive test for COVID (asymptomatic), plan for staging surgery. Given obesity, will plan for SLN injection given anticipated difficulty with LND.  Jeral Pinch, MD  Division of Gynecologic Oncology  Department of Obstetrics and Gynecology  [facility]  ___________________________________________  Chief Complaint: No chief complaint on file.   History of Present Illness:  Brittney Lamb is a 64 y.o. y.o. female who is seen in consultation at the request of Dr. 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  Patient reports having postmenopausal bleeding since November that started as spotting and then became heavier like a menses.  She had bleeding intermittently, not daily.  She endorsed cramps and passage of clots.  Since menopause, she has had 2 shorter periods (1-3 days) of spotting in the last 4-5 years.  She underwent a Pap test in January 2021 that showed Herbert Pun, favor neoplasm.  Ultrasound showed thickened endometrial lining with solid mass.  She underwent endometrial biopsy on 2/12 with findings noted below.  Overall she reports doing well.  She endorses a normal appetite without nausea or emesis.  She denies any early satiety.  She reports normal bowel and bladder function.  She has some shortness of breath with ambulation but can climb a flight of stairs without difficulty.  She had laparoscopic surgery in the 1990s for what sounds like infertility and some tubal disease.  At that time, she was told that she had several uterine fibroids.  She denies any other abdominal surgery.  PAST MEDICAL HISTORY:  Past Medical History:  Diagnosis Date  . Arthritis    Spinal - inflammatory  . BMI 50.0-59.9, adult ([place])   . Endometrial cancer ([place])   . GERD (gastroesophageal reflux disease)   . Hypertension   . Morbid obesity  ([place])   . Uterine cancer ([place])      PAST SURGICAL HISTORY:  Past Surgical History:  Procedure Laterality Date  . cardiac catherization  2011  . LAPAROSCOPY  1996   scar tissue  . TONSILLECTOMY    . WISDOM TOOTH EXTRACTION      OB/GYN HISTORY:  OB History  Gravida Para Term Preterm AB Living  0 0 0 0 0 0  SAB TAB Ectopic Multiple Live Births  0 0 0 0 0    No LMP recorded. Patient is postmenopausal.  MEDICATIONS: Scheduled Meds: . dexamethasone (DECADRON) injection  4 mg Intravenous On Call to OR  . scopolamine  1 patch Transdermal On Call to OR   Continuous Infusions: . lactated ringers 50 mL/hr at 08/15/19 0644   PRN Meds:.   ALLERGIES:  Allergies  Allergen Reactions  . Lisinopril Swelling  . Saxenda [Liraglutide -Weight Management] Itching and Rash     FAMILY HISTORY:  Family History  Problem Relation Age of Onset  . Heart disease Father   . Thyroid disease Father   . Hypertension Mother   . COPD Mother   . Hypertension Brother   . Thyroid disease Sister   . COPD Sister   . Hypertension Sister   . Neuropathy Sister   . Ovarian cancer Paternal Aunt   . Colon cancer Neg Hx   . Uterine cancer Neg Hx   . Breast cancer Neg Hx      SOCIAL HISTORY:    Social Connections:   . Frequency of Communication with Friends and Family:   . Frequency of  Social Gatherings with Friends and Family:   . Attends Religious Services:   . Active Member of Clubs or Organizations:   . Attends Archivist Meetings:   Marland Kitchen Marital Status:     REVIEW OF SYSTEMS:  Denies appetite changes, fevers, chills, fatigue, unexplained weight changes. Denies hearing loss, neck lumps or masses, mouth sores, ringing in ears or voice changes. Denies cough or wheezing.  Denies shortness of breath. Denies chest pain or palpitations. Denies leg swelling. Denies abdominal distention, pain, blood in stools, constipation, diarrhea, nausea, vomiting, or early satiety. Denies pain with  intercourse, dysuria, frequency, hematuria or incontinence. Denies hot flashes, pelvic pain, vaginal bleeding or vaginal discharge.   Denies joint pain, back pain or muscle pain/cramps. Denies itching, rash, or wounds. Denies dizziness, headaches, numbness or seizures. Denies swollen lymph nodes or glands, denies easy bruising or bleeding. Denies anxiety, depression, confusion, or decreased concentration.  Physical Exam:  Vital Signs for this encounter:  Blood pressure (!) 155/84, pulse 83, temperature 98.3 F (36.8 C), temperature source Oral, resp. rate 18, height 5\' 2"  (1.575 m), weight 273 lb 8 oz (124.1 kg), SpO2 96 %. Body mass index is 50.02 kg/m. General: Alert, oriented, no acute distress.  HEENT: Normocephalic, atraumatic. Sclera anicteric.  See remainder of exam from clinic visit  LABORATORY AND RADIOLOGIC DATA:   Lab Results  Component Value Date   WBC 10.3 08/09/2019   HGB 12.2 08/09/2019   HCT 39.2 08/09/2019   PLT 295 08/09/2019   GLUCOSE 127 (H) 08/09/2019   ALT 14 07/14/2019   AST 18 07/14/2019   NA 139 08/09/2019   K 3.2 (L) 08/09/2019   CL 102 08/09/2019   CREATININE 0.91 08/09/2019   BUN 17 08/09/2019   CO2 29 08/09/2019

## 2019-08-15 NOTE — Transfer of Care (Signed)
Immediate Anesthesia Transfer of Care Note  Patient: Brittney Lamb  Procedure(s) Performed: XI ROBOTIC ASSISTED TOTAL HYSTERECTOMY WITH BILATERAL SALPINGO OOPHORECTOMY (Bilateral ) LYMPH NODE DISSECTION,LAPAROTOMY (N/A )  Patient Location: PACU  Anesthesia Type:General  Level of Consciousness: drowsy  Airway & Oxygen Therapy: Patient Spontanous Breathing and Patient connected to face mask oxygen  Post-op Assessment: Report given to RN and Post -op Vital signs reviewed and stable  Post vital signs: Reviewed and stable  Last Vitals:  Vitals Value Taken Time  BP 154/86 08/15/19 1255  Temp    Pulse 86 08/15/19 1256  Resp 14 08/15/19 1256  SpO2 95 % 08/15/19 1256  Vitals shown include unvalidated device data.  Last Pain:  Vitals:   08/15/19 0621  TempSrc:   PainSc: 0-No pain         Complications: No apparent anesthesia complications

## 2019-08-15 NOTE — Brief Op Note (Signed)
08/15/2019  12:38 PM  PATIENT:  Brittney Lamb  64 y.o. female  PRE-OPERATIVE DIAGNOSIS:  ENDOMETRIAL CANCER  POST-OPERATIVE DIAGNOSIS:  ENDOMETRIAL CANCER  PROCEDURE:  Procedure(s): XI ROBOTIC ASSISTED TOTAL HYSTERECTOMY WITH BILATERAL SALPINGO OOPHORECTOMY (Bilateral) LYMPH NODE DISSECTION,LAPAROTOMY (N/A)  SURGEON:  Surgeon(s) and Role:    * Lafonda Mosses, MD - Primary    * Lahoma Crocker, MD - Assisting   ANESTHESIA:   general, Exparel  EBL:  100 mL   BLOOD ADMINISTERED:none  DRAINS: none   LOCAL MEDICATIONS USED:  MARCAINE     SPECIMEN:  Uterus, cervix, bilateral adnexa, bilateral pelvic lymph nodes  DISPOSITION OF SPECIMEN:  PATHOLOGY  COUNTS:  YES  TOURNIQUET:  * No tourniquets in log *  DICTATION: .Note written in EPIC  PLAN OF CARE: Discharge to home after PACU  PATIENT DISPOSITION:  PACU - hemodynamically stable.   Delay start of Pharmacological VTE agent (>24hrs) due to surgical blood loss or risk of bleeding: not applicable

## 2019-08-15 NOTE — Anesthesia Procedure Notes (Signed)
Procedure Name: Intubation Date/Time: 08/15/2019 7:34 AM Performed by: Sharlette Dense, CRNA Patient Re-evaluated:Patient Re-evaluated prior to induction Oxygen Delivery Method: Circle system utilized Preoxygenation: Pre-oxygenation with 100% oxygen Induction Type: IV induction Ventilation: Mask ventilation without difficulty and Oral airway inserted - appropriate to patient size Laryngoscope Size: Miller and 3 Grade View: Grade I Tube type: Oral Tube size: 7.5 mm Number of attempts: 1 Airway Equipment and Method: Stylet Placement Confirmation: ETT inserted through vocal cords under direct vision,  positive ETCO2 and breath sounds checked- equal and bilateral Secured at: 22 cm Tube secured with: Tape Dental Injury: Teeth and Oropharynx as per pre-operative assessment

## 2019-08-15 NOTE — Anesthesia Postprocedure Evaluation (Signed)
Anesthesia Post Note  Patient: Brittney Lamb  Procedure(s) Performed: XI ROBOTIC ASSISTED TOTAL HYSTERECTOMY WITH BILATERAL SALPINGO OOPHORECTOMY (Bilateral ) LYMPH NODE DISSECTION,LAPAROTOMY (N/A )     Patient location during evaluation: PACU Anesthesia Type: General Level of consciousness: awake and alert Pain management: pain level controlled Vital Signs Assessment: post-procedure vital signs reviewed and stable Respiratory status: spontaneous breathing, nonlabored ventilation, respiratory function stable and patient connected to nasal cannula oxygen Cardiovascular status: blood pressure returned to baseline and stable Postop Assessment: no apparent nausea or vomiting Anesthetic complications: no    Last Vitals:  Vitals:   08/15/19 1345 08/15/19 1441  BP: (!) 166/99 139/89  Pulse: 89 87  Resp: 18 20  Temp: 36.7 C 36.6 C  SpO2: 93% 93%    Last Pain:  Vitals:   08/15/19 1441  TempSrc:   PainSc: 0-No pain                 Effie Berkshire

## 2019-08-15 NOTE — Discharge Instructions (Signed)
08/15/2019  Return to work: 6 weeks  Activity: 1. Be up and out of the bed during the day.  Take a nap if needed.  You may walk up steps but be careful and use the hand rail.  Stair climbing will tire you more than you think, you may need to stop part way and rest.   2. No lifting or straining for 6 weeks.  3. No driving for 1-2 weeks.  Do Not drive if you are taking narcotic pain medicine and can brake safely.  4. Shower daily.  Use soap and water on your incision and pat dry; don't rub.   5. No sexual activity and nothing in the vagina for 8 weeks.  Medications:  - Take ibuprofen and tylenol first line for pain control. Take these regularly (every 6 hours) to decrease the build up of pain.  - If necessary, for severe pain not relieved by ibuprofen, take oxycodone.  - While taking percocet you should take sennakot every night to reduce the likelihood of constipation. If this causes diarrhea, stop its use.  Diet: 1. Low sodium Heart Healthy Diet is recommended.  2. It is safe to use a laxative if you have difficulty moving your bowels.   Wound Care: 1. Keep clean and dry.  Shower daily.  2. Incisions have glue on them. Larger incision is covered with a honeycomb dressing - leave this on your incision for 5 days.  Reasons to call the Doctor:   Fever - Oral temperature greater than 100.4 degrees Fahrenheit  Foul-smelling vaginal discharge  Difficulty urinating  Nausea and vomiting  Increased pain at the site of the incision that is unrelieved with pain medicine.  Difficulty breathing with or without chest pain  New calf pain especially if only on one side  Sudden, continuing increased vaginal bleeding with or without clots.   Follow-up: 1. See Jeral Pinch in 1 and 3 weeks.  Contacts: For questions or concerns you should contact:  Dr. Jeral Pinch at [phone] After hours and on week-ends call [phone] and ask to speak to the physician on call for  Gynecologic Oncology  After Your Surgery  The information in this section will tell you what to expect after your surgery, both during your stay and after you leave. You will learn how to safely recover from your surgery. Write down any questions you have and be sure to ask your doctor or nurse.  What to Expect When you wake up after your surgery, you will be in the [facility] (P[facility]U) or your recovery room. A nurse will be monitoring your body temperature, blood pressure, pulse, and oxygen levels. You may have a urinary catheter in your bladder to help monitor the amount of urine you are making. It should come out before you go home. You will also have compression boots on your lower legs to help your circulation. Your pain medication will be given through an IV line or in tablet form. If you are having pain, tell your nurse. Your nurse will tell you how to recover from your surgery. Below are examples of ways you can help yourself recover safely. . You will be encouraged to walk with the help of your nurse or physical therapist. We will give you medication to relieve pain. Walking helps reduce the risk for blood clots and pneumonia. It also helps to stimulate your bowels so they begin working again. . Use your incentive spirometer. This will help your lungs expand, which  prevents pneumonia.   Commonly Asked Questions  Will I have pain after surgery? Yes, you will have some pain after your surgery, especially in the first few days. Your doctor and nurse will ask you about your pain often. You will be given medication to manage your pain as needed. If your pain is not relieved, please tell your doctor or nurse. It is important to control your pain so you can cough, breathe deeply, use your incentive spirometer, and get out of bed and walk.  Will I be able to eat? Yes, you will be able to eat a regular diet or eat as tolerated. You should start with foods that are soft and easy to  digest such as apple sauce and chicken noodle soup. Eat small meals frequently, and then advance to regular foods. If you experience bloating, gas, or cramps, limit high-fiber foods, including whole grain breads and cereal, nuts, seeds, salads, fresh fruit, broccoli, cabbage, and cauliflower. Will I have pain when I am home? The length of time each person has pain or discomfort varies. You may still have some pain when you go home and will probably be taking pain medication. Follow the guidelines below. . Take your medications as directed and as needed. . Call your doctor if the medication prescribed for you doesn't relieve your pain. . Don't drive or drink alcohol while you're taking prescription pain medication. . As your incision heals, you will have less pain and need less pain medication. A mild pain reliever such as acetaminophen (Tylenol) or ibuprofen (Advil) will relieve aches and discomfort. However, large quantities of acetaminophen may be harmful to your liver. Don't take more acetaminophen than the amount directed on the bottle or as instructed by your doctor or nurse. . Pain medication should help you as you resume your normal activities. Take enough medication to do your exercises comfortably. Pain medication is most effective 30 to 45 minutes after taking it. Marland Kitchen Keep track of when you take your pain medication. Taking it when your pain first begins is more effective than waiting for the pain to get worse. Pain medication may cause constipation (having fewer bowel movements than what is normal for you).  How can I prevent constipation? . Go to the bathroom at the same time every day. Your body will get used to going at that time. . If you feel the urge to go, don't put it off. Try to use the bathroom 5 to 15 minutes after meals. . After breakfast is a good time to move your bowels. The reflexes in your colon are strongest at this time. . Exercise, if you can. Walking is an excellent  form of exercise. . Drink 8 (8-ounce) glasses (2 liters) of liquids daily, if you can. Drink water, juices, soups, ice cream shakes, and other drinks that don't have caffeine. Drinks with caffeine, such as coffee and soda, pull fluid out of the body. . Slowly increase the fiber in your diet to 25 to 35 grams per day. Fruits, vegetables, whole grains, and cereals contain fiber. If you have an ostomy or have had recent bowel surgery, check with your doctor or nurse before making any changes in your diet. . Both over-the-counter and prescription medications are available to treat constipation. Start with 1 of the following over-the-counter medications first: o Docusate sodium (Colace) 100 mg. Take ___1__ capsules _2____ times a day. This is a stool softener that causes few side effects. Don't take it with mineral oil. o Polyethylene glycol (MiraLAX)  17 grams daily. o Senna (Senokot) 2 tablets at bedtime. This is a stimulant laxative, which can cause cramping. . If you haven't had a bowel movement in 2 days, call your doctor or nurse.  Can I shower? Yes, you should shower 24 hours after your surgery. Be sure to shower every day. Taking a warm shower is relaxing and can help decrease muscle aches. Use soap when you shower and gently wash your incision. Pat the areas dry with a towel after showering, and leave your incision uncovered (unless there is drainage). Call your doctor if you see any redness or drainage from your incision. Don't take tub baths until you discuss it with your doctor at the first appointment after your surgery. How do I care for my incisions? You will have several small incisions on your abdomen. The incisions are closed with Steri-Strips or Dermabond. You may also have square white dressings on your incisions (Primapore). You can remove these in the shower 24 hours after your surgery. You should clean your incisions with soap and water. If you go home with Steri-Strips on your  incision, they will loosen and may fall off by themselves. If they haven't fallen off within 10 days, you can remove them. If you go home with Dermabond over your sutures (stitches), it will also loosen and peel off.  What are the most common symptoms after a hysterectomy? It's common for you to have some vaginal spotting or light bleeding. You should monitor this with a pad or a panty liner. If you have having heavy bleeding (bleeding through a pad or liner every 1 to 2 hours), call your doctor right away. It's also common to have some discomfort after surgery from the air that was pumped into your abdomen during surgery. To help with this, walk, drink plenty of liquids and make sure to take the stool softeners you received.  When is it safe for me to drive? You may resume driving 2 weeks after surgery, as long as you are not taking pain medication that may make you drowsy.  When can I resume sexual activity? Do not place anything in your vagina or have vaginal intercourse for 8 weeks after your surgery. Some people will need to wait longer than 8 weeks, so speak with your doctor before resuming sexual intercourse.  Will I be able to travel? Yes, you can travel. If you are traveling by plane within a few weeks after your surgery, make sure you get up and walk every hour. Be sure to stretch your legs, drink plenty of liquids, and keep your feet elevated when possible.  Will I need any supplies? Most people do not need any supplies after the surgery. In the rare case that you do need supplies, such as tubes or drains, your nurse will order them for you.  When can I return to work? The time it takes to return to work depends on the type of work you do, the type of surgery you had, and how fast your body heals. Most people can return to work about 2 to 4 weeks after the surgery.  What exercises can I do? Exercise will help you gain strength and feel better. Walking and stair climbing are  excellent forms of exercise. Gradually increase the distance you walk. Climb stairs slowly, resting or stopping as needed. Ask your doctor or nurse before starting more strenuous exercises.  When can I lift heavy objects? Most people should not lift anything heavier than 10 pounds (4.5  kilograms) for at least 4 weeks after surgery. Speak with your doctor about when you can do heavy lifting.  How can I cope with my feelings? After surgery for a serious illness, you may have new and upsetting feelings. Many people say they felt weepy, sad, worried, nervous, irritable, and angry at one time or another. You may find that you can't control some of these feelings. If this happens, it's a good idea to seek emotional support. The first step in coping is to talk about how you feel. Family and friends can help. Your nurse, doctor, and social worker can reassure, support, and guide you. It's always a good idea to let these professionals know how you, your family, and your friends are feeling emotionally. Many resources are available to patients and their families. Whether you're in the hospital or at home, the nurses, doctors, and social workers are here to help you and your family and friends handle the emotional aspects of your illness.  When is my first appointment after surgery? Your first appointment after surgery will be 2 to 4 weeks after surgery. Your nurse will give you instructions on how to make this appointment, including the phone number to call.  What if I have other questions? If you have any questions or concerns, please talk with your doctor or nurse. You can reach them Monday through Friday from 9:00 am to 5:00 pm. After 5:00 pm, during the weekend, and on holidays, call [phone] and ask for the doctor on call for your doctor.  . Have a temperature of 101 F (38.3 C) or higher . Have pain that does not get better with pain medication . Have redness, drainage, or swelling from your  incisions

## 2019-08-16 ENCOUNTER — Telehealth: Payer: Self-pay | Admitting: *Deleted

## 2019-08-16 NOTE — Telephone Encounter (Signed)
PC to patient post op day 1 - patient reports she is doing well, is eating & drinking, voiding without difficulty, passing gas.  Her pain is well controlled with ibuprofen, has not needed narcotic pain med so far.  Her incisions are dry & intact.  Patient advised to drink 64 oz of fluids daily & to move around in her home as tolerated.  Pt verbalizes understanding, has clinic phone number to call if she has questions/concerns.

## 2019-08-21 ENCOUNTER — Telehealth: Payer: Self-pay | Admitting: *Deleted

## 2019-08-21 NOTE — Telephone Encounter (Signed)
Error

## 2019-08-23 ENCOUNTER — Other Ambulatory Visit: Payer: Self-pay

## 2019-08-23 ENCOUNTER — Inpatient Hospital Stay: Payer: 59

## 2019-08-23 ENCOUNTER — Inpatient Hospital Stay: Payer: 59 | Attending: Gynecologic Oncology | Admitting: Gynecologic Oncology

## 2019-08-23 ENCOUNTER — Encounter: Payer: Self-pay | Admitting: Gynecologic Oncology

## 2019-08-23 VITALS — BP 99/74 | HR 82 | Temp 98.7°F | Resp 18 | Ht 61.0 in | Wt 271.2 lb

## 2019-08-23 DIAGNOSIS — Z79899 Other long term (current) drug therapy: Secondary | ICD-10-CM | POA: Insufficient documentation

## 2019-08-23 DIAGNOSIS — I10 Essential (primary) hypertension: Secondary | ICD-10-CM | POA: Insufficient documentation

## 2019-08-23 DIAGNOSIS — Z90710 Acquired absence of both cervix and uterus: Secondary | ICD-10-CM | POA: Diagnosis not present

## 2019-08-23 DIAGNOSIS — K219 Gastro-esophageal reflux disease without esophagitis: Secondary | ICD-10-CM | POA: Insufficient documentation

## 2019-08-23 DIAGNOSIS — Z791 Long term (current) use of non-steroidal anti-inflammatories (NSAID): Secondary | ICD-10-CM | POA: Diagnosis not present

## 2019-08-23 DIAGNOSIS — R300 Dysuria: Secondary | ICD-10-CM

## 2019-08-23 DIAGNOSIS — C541 Malignant neoplasm of endometrium: Secondary | ICD-10-CM

## 2019-08-23 DIAGNOSIS — K5909 Other constipation: Secondary | ICD-10-CM | POA: Diagnosis not present

## 2019-08-23 DIAGNOSIS — Z90722 Acquired absence of ovaries, bilateral: Secondary | ICD-10-CM | POA: Diagnosis not present

## 2019-08-23 DIAGNOSIS — R3911 Hesitancy of micturition: Secondary | ICD-10-CM | POA: Diagnosis not present

## 2019-08-23 LAB — URINALYSIS, COMPLETE (UACMP) WITH MICROSCOPIC
Bacteria, UA: NONE SEEN
Bilirubin Urine: NEGATIVE
Glucose, UA: NEGATIVE mg/dL
Hgb urine dipstick: NEGATIVE
Ketones, ur: NEGATIVE mg/dL
Nitrite: NEGATIVE
Protein, ur: NEGATIVE mg/dL
Specific Gravity, Urine: 1.018 (ref 1.005–1.030)
pH: 5 (ref 5.0–8.0)

## 2019-08-23 NOTE — Progress Notes (Signed)
Gynecologic Oncology Return Clinic Visit  4/7  Reason for Visit: Postop and discussion regarding treatment  Treatment History: Oncology History  Endometrial cancer ([place])  06/30/2019 Initial Biopsy   EMB: high grade adenocarcinoma   07/09/2019 Initial Diagnosis   Endometrial cancer ([place])   07/14/2019 Tumor Marker   CA-125: 354   07/21/2019 Imaging   CT A/P: IMPRESSION: 1. No gross extension of uterine carcinoma beyond the myometrium. 2. No pelvic lymphadenopathy. 3. No retroperitoneal periaortic adenopathy. 4. No visceral metastasis or skeletal metastasis. 5. Benign adenoma of the LEFT adrenal gland.  Benign renal cysts.   08/15/2019 Surgery   Robotic-assisted laparoscopic total hysterectomy with bilateral salpingoophorectomy, SLN injection, bilateral pelvic LND, mini-lap for specimen removal Modifier 22: significant obesity causing difficulty visualizing anatomy, increasing OR time    08/15/2019 Pathology Results   A. 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  - The surgical resection margins are negative for carcinoma.  - See oncology table below.   Myometrium: Leiomyomata.  Serosa: Unremarkable.  Bilateral adnexa: Benign ovaries and fallopian tubes with endometriosis.   B. LYMPH NODES, RIGHT PELVIC, RESECTION:  - There is no evidence of carcinoma in 6 of 6 lymph nodes (0/6).   C. LYMPH NODES, LEFT PELVIC, RESECTION:  - There is no evidence of carcinoma in 7 of 7 lymph nodes (0/7).    ONCOLOGY TABLE:   UTERUS, CARCINOMA OR CARCINOSARCOMA   Procedure: Total hysterectomy and bilateral salpingo-oophorectomy  Histologic type: Clear cell adenocarcinoma  Histologic Grade: High-grade  Myometrial invasion:       Depth of invasion: 19.5 mm       Myometrial thickness: 20 mm   Uterine Serosa Involvement: Not definitively identified  Cervical stromal involvement: Present  Extent of involvement of other organs: Confined to lower uterine segment  Lymphovascular invasion: Not identified  Regional Lymph Nodes:       Examined:        0 Sentinel                               13 non-sentinel                               13 total        Lymph nodes with metastasis: 0  Representative Tumor Block: A17  MMR / MSI testing: Can be ordered upon clinician request.  Pathologic Stage Classification (pTNM, AJCC 8th edition):  pT2, pN0  (FIGO stage II)  Comments: Dr. Claudette Laws has reviewed selected slides and concurs with  the phenotype of this tumor.  Additional studies can be performed upon  clinician request.    08/15/2019 Cancer Staging   Staging form: Corpus Uteri - Carcinoma and Carcinosarcoma, AJCC 8th Edition - Clinical stage from 08/15/2019: FIGO Stage II (cT2, cN0, cM0) - Signed by Lafonda Mosses, MD on 08/23/2019     Interval History: Patient reports overall doing well since surgery.  She denies any vaginal bleeding or discharge.  She had some constipation initially but has been using MiraLAX now and began having bowel function on Sunday.  She endorses a good appetite without nausea or vomiting.  She denies any fevers or chills.  She required pain medications for only 1 day at  home and otherwise has been off medications.  She endorses some dysuria and pressure when she voids as well as hesitancy and difficulty emptying completely.  Past Medical/Surgical History: Past Medical History:  Diagnosis Date  . Arthritis    Spinal - inflammatory  . BMI 50.0-59.9, adult ([place])   . Endometrial cancer ([place])   . GERD (gastroesophageal reflux disease)   . Hypertension   . Morbid obesity ([place])   . Uterine cancer [facility])     Past Surgical History:  Procedure Laterality Date  . cardiac catherization  2011  . LAPAROSCOPY  1996   scar tissue  . ROBOTIC ASSISTED TOTAL  HYSTERECTOMY WITH BILATERAL SALPINGO OOPHERECTOMY Bilateral 08/15/2019   Procedure: XI ROBOTIC ASSISTED TOTAL HYSTERECTOMY WITH BILATERAL SALPINGO OOPHORECTOMY;  Surgeon: Lafonda Mosses, MD;  Location: [facility] ORS;  Service: Gynecology;  Laterality: Bilateral;  . SENTINEL NODE BIOPSY N/A 08/15/2019   Procedure: LYMPH NODE DISSECTION,LAPAROTOMY;  Surgeon: Lafonda Mosses, MD;  Location: [facility];  Service: Gynecology;  Laterality: N/A;  . TONSILLECTOMY    . WISDOM TOOTH EXTRACTION      Family History  Problem Relation Age of Onset  . Heart disease Father   . Thyroid disease Father   . Hypertension Mother   . COPD Mother   . Hypertension Brother   . Thyroid disease Sister   . COPD Sister   . Hypertension Sister   . Neuropathy Sister   . Ovarian cancer Paternal Aunt   . Colon cancer Neg Hx   . Uterine cancer Neg Hx   . Breast cancer Neg Hx     Social History   Socioeconomic History  . Marital status: Married    Spouse name: Not on file  . Number of children: Not on file  . Years of education: Not on file  . Highest education level: Not on file  Occupational History  . Not on file  Tobacco Use  . Smoking status: Never Smoker  . Smokeless tobacco: Never Used  Substance and Sexual Activity  . Alcohol use: Not Currently    Comment: rare glass of wine  . Drug use: Never  . Sexual activity: Not Currently  Other Topics Concern  . Not on file  Social History Narrative  . Not on file   Social Determinants of Health   Financial Resource Strain:   . Difficulty of Paying Living Expenses:   Food Insecurity:   . Worried About Charity fundraiser in the Last Year:   . Arboriculturist in the Last Year:   Transportation Needs:   . Film/video editor (Medical):   Marland Kitchen Lack of Transportation (Non-Medical):   Physical Activity:   . Days of Exercise per Week:   . Minutes of Exercise per Session:   Stress:   . Feeling of Stress :   Social Connections:   . Frequency of  Communication with Friends and Family:   . Frequency of Social Gatherings with Friends and Family:   . Attends Religious Services:   . Active Member of Clubs or Organizations:   . Attends Archivist Meetings:   Marland Kitchen Marital Status:     Current Medications:  Current Outpatient Medications:  .  amLODipine (NORVASC) 2.5 MG tablet, Take 2.5 mg by mouth daily., Disp: , Rfl:  .  ferrous sulfate 325 (65 FE) MG tablet, Take 325 mg by mouth daily with breakfast., Disp: , Rfl:  .  hydrochlorothiazide (HYDRODIURIL) 25 MG tablet, Take 25 mg by  mouth daily., Disp: , Rfl:  .  ibuprofen (ADVIL) 600 MG tablet, Take 1 tablet (600 mg total) by mouth every 6 (six) hours as needed for moderate pain. For AFTER surgery only, Disp: 30 tablet, Rfl: 0 .  meloxicam (MOBIC) 15 MG tablet, Take 15 mg by mouth daily., Disp: , Rfl:  .  Multiple Vitamin (MULTIVITAMIN) tablet, Take 1 tablet by mouth daily., Disp: , Rfl:  .  oxyCODONE (OXY IR/ROXICODONE) 5 MG immediate release tablet, Take 1 tablet (5 mg total) by mouth every 4 (four) hours as needed for severe pain. For AFTER surgery only, do not take and drive, Disp: 10 tablet, Rfl: 0 .  rosuvastatin (CRESTOR) 20 MG tablet, Take 20 mg by mouth daily., Disp: , Rfl:  .  senna-docusate (SENOKOT-S) 8.6-50 MG tablet, Take 2 tablets by mouth at bedtime. For AFTER surgery, do not take if having diarrhea, Disp: 60 tablet, Rfl: 1  Review of Systems: Pertinent positives as per HPI Denies appetite changes, fevers, chills, fatigue, unexplained weight changes. Denies hearing loss, neck lumps or masses, mouth sores, ringing in ears or voice changes. Denies cough or wheezing.  Denies shortness of breath. Denies chest pain or palpitations. Denies leg swelling. Denies abdominal distention, pain, blood in stools, constipation, diarrhea, nausea, vomiting, or early satiety. Denies hot flashes, pelvic pain, vaginal bleeding or vaginal discharge.   Denies joint pain, back pain or  muscle pain/cramps. Denies itching, rash, or wounds. Denies dizziness, headaches, numbness or seizures. Denies swollen lymph nodes or glands, denies easy bruising or bleeding. Denies anxiety, depression, confusion, or decreased concentration.  Physical Exam: BP 99/74 (BP Location: Right Arm, Patient Position: Sitting)   Pulse 82   Temp 98.7 F (37.1 C) (Temporal)   Resp 18   Ht '5\' 1"'  (1.549 m)   Wt 271 lb 4 oz (123 kg)   SpO2 100%   BMI 51.25 kg/m  General: Alert, oriented, no acute distress. HEENT: Normocephalic, atraumatic, sclera anicteric. Chest: Unlabored breathing on room air Abdomen: Obese, soft, nontender.  Normoactive bowel sounds.  No masses or hepatosplenomegaly appreciated.  Well-healing incisions, honeycomb dressing removed from mini-lap site. Extremities: Grossly normal range of motion.  Warm, well perfused.  No edema bilaterally.  Laboratory & Radiologic Studies: A. 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  - The surgical resection margins are negative for carcinoma.  - See oncology table below.   Myometrium: Leiomyomata.  Serosa: Unremarkable.  Bilateral adnexa: Benign ovaries and fallopian tubes with endometriosis.   B. LYMPH NODES, RIGHT PELVIC, RESECTION:  - There is no evidence of carcinoma in 6 of 6 lymph nodes (0/6).   C. LYMPH NODES, LEFT PELVIC, RESECTION:  - There is no evidence of carcinoma in 7 of 7 lymph nodes (0/7).  Assessment & Plan: Brittney Lamb is a 64 y.o. woman with Stage II high-grade clear cell adenocarcinoma who presents for postop follow-up.  The patient is doing quite well postoperatively.  She is having some urinary symptoms suspicious for urinary tract infection.  We will plan to send a UA and culture today and call her with the  results as well as treat if indicated.  Also reviewed her pathology in detail.  Discussed that given surgical stage as well as histology, I would recommend systemic treatment in the form of chemotherapy likely followed by vaginal brachytherapy.  Patient knows that we will discuss her at tumor board next Monday and I will call her with final recommendations.  15 minutes of total time was spent for this patient encounter, including preparation, face-to-face counseling with the patient and coordination of care, and documentation of the encounter.  Jeral Pinch, MD  Division of Gynecologic Oncology  Department of Obstetrics and Gynecology  [facility]

## 2019-08-23 NOTE — Patient Instructions (Signed)
You are healing well! I will see you in a couple of weeks to check the incision in the vagina. I will let you know if your urine from today looks like you have an infection.

## 2019-08-25 ENCOUNTER — Telehealth: Payer: Self-pay | Admitting: *Deleted

## 2019-08-25 ENCOUNTER — Telehealth: Payer: Self-pay

## 2019-08-25 DIAGNOSIS — R300 Dysuria: Secondary | ICD-10-CM

## 2019-08-25 LAB — URINE CULTURE: Culture: 10000 — AB

## 2019-08-25 MED ORDER — NITROFURANTOIN MONOHYD MACRO 100 MG PO CAPS: 100.0000 mg | ORAL_CAPSULE | Freq: Two times a day (BID) | ORAL | 0 refills | Status: DC

## 2019-08-25 NOTE — Telephone Encounter (Signed)
Ms Brittney Lamb states that she continues with pressure and dysuria as noted by Dr. Berline Lopes at visit 08-23-19. Will send in Macrobid 100 mg bid x 5 days to pharmacy. Instructed pt to increase fluid intake to 64 oz of water and cranberry juice. She is to call the office if her symptoms continue after completion of antibiotics. Pt verbalized understanding.

## 2019-08-25 NOTE — Telephone Encounter (Signed)
LM for patient to call regarding results of the urine culture.

## 2019-08-25 NOTE — Telephone Encounter (Addendum)
Called Rhonda in pathology and added MSI/IHC to patient's specimen per Dr Berline Lopes.  The accession number [id number].

## 2019-08-28 ENCOUNTER — Other Ambulatory Visit: Payer: Self-pay | Admitting: Oncology

## 2019-08-28 ENCOUNTER — Encounter: Payer: Self-pay | Admitting: Oncology

## 2019-08-28 DIAGNOSIS — C541 Malignant neoplasm of endometrium: Secondary | ICD-10-CM

## 2019-08-28 NOTE — Progress Notes (Signed)
Called [name] and discussed recommendations from [place].  Advised her of appointments with Dr. Alvy Bimler and for Patient Education on 09/12/19.  Discussed the recommendation for Genetic Counseling and she would like to have it scheduled.  Also discussed that Radiation Oncology will be calling her with an appointment with Dr. Sondra Come.  She verbalized agreement and understanding.  She also reported that she is still having pain/pressure in her rectal area and when she urinates.  She said it feels like she needs to have a bowel movement. She is taking Macrobid but her symptoms have not improved. She is also taking Ibuprofen for the pain.

## 2019-08-28 NOTE — Progress Notes (Signed)
Called [name] to discuss plan for CT scan.  She said she had a bowel movement and feels much better.  She wants to hold off on the CT scan for now.  Dr. Berline Lopes is aware.

## 2019-08-28 NOTE — Progress Notes (Signed)
Gynecologic Oncology Multi-Disciplinary Disposition Conference Note  Date of the Conference: 08/28/2019  Patient Name: Brittney Lamb  Referring Provider: Dr. Kennon Rounds Primary GYN Oncologist: Dr. Berline Lopes  Stage/Disposition:  Stage II invasive clear cell adenocarcinoma. Disposition is to chemotherapy with cisplatin on day 1 and 28 with external beam radiation followed by 4 cycles of chemotherapy with carboplatin/Taxol.   This Multidisciplinary conference took place involving physicians from [place], [place], Radiation Oncology, Pathology, Radiology along with the Gynecologic Oncology Nurse Practitioner and RN.  Comprehensive assessment of the patient's malignancy, staging, need for surgery, chemotherapy, radiation therapy, and need for further testing were reviewed. Supportive measures, both inpatient and following discharge were also discussed. The recommended plan of care is documented. Greater than 35 minutes were spent correlating and coordinating this patient's care.

## 2019-08-29 LAB — SURGICAL PATHOLOGY

## 2019-08-30 ENCOUNTER — Telehealth: Payer: Self-pay | Admitting: Oncology

## 2019-08-30 NOTE — Telephone Encounter (Signed)
[name] said she is feeling tired today from having her second Covid vaccine yesterday.  She said the pressure she was having is "not as bad."  She did say her urine was pink tinged after wiping this morning which she thinks is from the antibiotic.  Asked if she would like to have the CT scan and she said she would like to wait.  Advised her to call back if her urine is still pink tinged or the pressure starts to bother her.  She verbalized understanding and agreement.

## 2019-09-01 ENCOUNTER — Encounter (HOSPITAL_COMMUNITY): Payer: Self-pay

## 2019-09-04 ENCOUNTER — Inpatient Hospital Stay (HOSPITAL_BASED_OUTPATIENT_CLINIC_OR_DEPARTMENT_OTHER): Payer: 59 | Admitting: Genetic Counselor

## 2019-09-04 ENCOUNTER — Encounter: Payer: Self-pay | Admitting: Genetic Counselor

## 2019-09-04 DIAGNOSIS — Z8049 Family history of malignant neoplasm of other genital organs: Secondary | ICD-10-CM | POA: Diagnosis not present

## 2019-09-04 DIAGNOSIS — C541 Malignant neoplasm of endometrium: Secondary | ICD-10-CM | POA: Diagnosis not present

## 2019-09-04 DIAGNOSIS — Z800 Family history of malignant neoplasm of digestive organs: Secondary | ICD-10-CM

## 2019-09-04 DIAGNOSIS — Z808 Family history of malignant neoplasm of other organs or systems: Secondary | ICD-10-CM | POA: Diagnosis not present

## 2019-09-04 NOTE — Progress Notes (Signed)
REFERRING PROVIDER: Dorothyann Gibbs, NP [place],  [place] [address]  PRIMARY PROVIDER:  [name], MD  PRIMARY REASON FOR VISIT:  1. Endometrial cancer ([place])   2. Family history of colon cancer   3. Family history of cervical cancer   4. Family history of brain cancer      I connected with Ms. Kusch on 09/04/2019 at 11:00 am EDT by Mychart video conference and verified that I am speaking with the correct person using two identifiers.   Patient location: Home Provider location: [facility] office  HISTORY OF PRESENT ILLNESS:   Ms. Peace, a 64 y.o. female, was seen for a [facility] cancer genetics consultation at the request of Dr. Elinor Parkinson due to a personal and family history of cancer.  Ms. Seckinger presents to clinic today to discuss the possibility of a hereditary predisposition to cancer, genetic testing, and to further clarify her future cancer risks, as well as potential cancer risks for family members.   In February of 2021, at the age of 69, Ms. 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 The treatment plan includes surgery (completed 08/15/19), chemotherapy, and likely vaginal brachytherapy. Her tumor was microsatellite stable and mismatch repair protein IHC was intact.   CANCER HISTORY:  Oncology History  Endometrial cancer ([place])  06/30/2019 Initial Biopsy   EMB: high grade adenocarcinoma   07/09/2019 Initial Diagnosis   Endometrial cancer ([place])   07/14/2019 Tumor Marker   CA-125: 354   07/21/2019 Imaging   CT A/P: IMPRESSION: 1. No gross extension of uterine carcinoma beyond the myometrium. 2. No pelvic lymphadenopathy. 3. No retroperitoneal periaortic adenopathy. 4. No visceral metastasis or skeletal metastasis. 5. Benign adenoma of the LEFT adrenal gland.  Benign renal cysts.   08/15/2019 Surgery   Robotic-assisted laparoscopic total hysterectomy with bilateral salpingoophorectomy, SLN injection, bilateral pelvic LND,  mini-lap for specimen removal Modifier 22: significant obesity causing difficulty visualizing anatomy, increasing OR time    08/15/2019 Pathology Results   A. 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  - The surgical resection margins are negative for carcinoma.  - See oncology table below.   Myometrium: Leiomyomata.  Serosa: Unremarkable.  Bilateral adnexa: Benign ovaries and fallopian tubes with endometriosis.   B. LYMPH NODES, RIGHT PELVIC, RESECTION:  - There is no evidence of carcinoma in 6 of 6 lymph nodes (0/6).   C. LYMPH NODES, LEFT PELVIC, RESECTION:  - There is no evidence of carcinoma in 7 of 7 lymph nodes (0/7).    ONCOLOGY TABLE:   UTERUS, CARCINOMA OR CARCINOSARCOMA   Procedure: Total hysterectomy and bilateral salpingo-oophorectomy  Histologic type: Clear cell adenocarcinoma  Histologic Grade: High-grade  Myometrial invasion:       Depth of invasion: 19.5 mm       Myometrial thickness: 20 mm  Uterine Serosa Involvement: Not definitively identified  Cervical stromal involvement: Present  Extent of involvement of other organs: Confined to lower uterine segment  Lymphovascular invasion: Not identified  Regional Lymph Nodes:       Examined:        0 Sentinel                               13 non-sentinel  13 total        Lymph nodes with metastasis: 0  Representative Tumor Block: A17  MMR / MSI testing: Can be ordered upon clinician request.  Pathologic Stage Classification (pTNM, AJCC 8th edition):  pT2, pN0  (FIGO stage II)  Comments: Dr. Claudette Laws has reviewed selected slides and concurs with  the phenotype of this tumor.  Additional studies can be performed upon  clinician request.    08/15/2019 Cancer Staging   Staging  form: Corpus Uteri - Carcinoma and Carcinosarcoma, AJCC 8th Edition - Clinical stage from 08/15/2019: FIGO Stage II (cT2, cN0, cM0) - Signed by Lafonda Mosses, MD on 08/23/2019      RISK FACTORS:  Menarche was at age 20.  No live births.  OCP use for approximately 3 years.  Ovaries intact: no.  Hysterectomy: yes.  Menopausal status: postmenopausal.  HRT use: 0 years. Colonoscopy: yes, most recently September 2021; cumulative 2 polyps per patient. Mammogram within the last year: yes. Number of breast biopsies: 0. Any excessive radiation exposure in the past: no  Past Medical History:  Diagnosis Date  . Arthritis    Spinal - inflammatory  . BMI 50.0-59.9, adult ([place])   . Endometrial cancer ([place])   . Family history of brain cancer   . Family history of cervical cancer   . Family history of colon cancer   . GERD (gastroesophageal reflux disease)   . Hypertension   . Morbid obesity ([place])   . Uterine cancer [facility])     Past Surgical History:  Procedure Laterality Date  . cardiac catherization  2011  . LAPAROSCOPY  1996   scar tissue  . ROBOTIC ASSISTED TOTAL HYSTERECTOMY WITH BILATERAL SALPINGO OOPHERECTOMY Bilateral 08/15/2019   Procedure: XI ROBOTIC ASSISTED TOTAL HYSTERECTOMY WITH BILATERAL SALPINGO OOPHORECTOMY;  Surgeon: Lafonda Mosses, MD;  Location: [facility] ORS;  Service: Gynecology;  Laterality: Bilateral;  . SENTINEL NODE BIOPSY N/A 08/15/2019   Procedure: LYMPH NODE DISSECTION,LAPAROTOMY;  Surgeon: Lafonda Mosses, MD;  Location: [facility] ORS;  Service: Gynecology;  Laterality: N/A;  . TONSILLECTOMY    . WISDOM TOOTH EXTRACTION      Social History   Socioeconomic History  . Marital status: Married    Spouse name: Not on file  . Number of children: Not on file  . Years of education: Not on file  . Highest education level: Not on file  Occupational History  . Not on file  Tobacco Use  . Smoking status: Never Smoker  . Smokeless tobacco: Never Used  Substance  and Sexual Activity  . Alcohol use: Not Currently    Comment: rare glass of wine  . Drug use: Never  . Sexual activity: Not Currently  Other Topics Concern  . Not on file  Social History Narrative  . Not on file   Social Determinants of Health   Financial Resource Strain:   . Difficulty of Paying Living Expenses:   Food Insecurity:   . Worried About Charity fundraiser in the Last Year:   . Arboriculturist in the Last Year:   Transportation Needs:   . Film/video editor (Medical):   Marland Kitchen Lack of Transportation (Non-Medical):   Physical Activity:   . Days of Exercise per Week:   . Minutes of Exercise per Session:   Stress:   . Feeling of Stress :   Social Connections:   . Frequency of Communication with Friends and Family:   . Frequency of Social Gatherings with  Friends and Family:   . Attends Religious Services:   . Active Member of Clubs or Organizations:   . Attends Archivist Meetings:   Marland Kitchen Marital Status:      FAMILY HISTORY:  We obtained a detailed, 4-generation family history.  Significant diagnoses are listed below: Family History  Problem Relation Age of Onset  . Heart disease Father   . Thyroid disease Father   . Hypertension Mother   . COPD Mother   . Hypertension Half-Brother   . Thyroid disease Half-Sister   . COPD Half-Sister   . Hypertension Half-Sister   . Neuropathy Half-Sister   . Cervical cancer Paternal Aunt        dx. in her late 2s  . Brain cancer Paternal Uncle 80  . Cervical cancer Paternal Grandmother        dx. in her 44s  . Uterine cancer Neg Hx   . Breast cancer Neg Hx    Ms. Gruetzmacher has one adopted daughter. She has one maternal half-sister (age 48), a paternal half-brother (age 11) and a paternal half-sister (age 40). None of her siblings have had cancer.   Ms. Bowland mother died at the age of 51 and did not have cancer. Ms. Plazola did not have any maternal aunts or uncles. Her maternal grandmother died at the age of 5  and had a history of colon cancer diagnosed when she was older than 70. Her maternal grandfather died when he was younger than 82. There are no other known diagnoses of cancer on the maternal side of the family.  Ms. Yim father is 92 and has not had cancer. She has five paternal aunts and two paternal uncles. One uncle died at the age of 62 from a brain tumor. One aunt died in her late 43s from cervical cancer. There are no known cancer diagnoses among any paternal first cousins. Her paternal grandmother died in her 58s and had cervical cancer, and her paternal grandfather died in his late 30s or early 102s. There are no other known diagnoses of cancer on the paternal side of the family.  Ms. Frank is unaware of previous family history of genetic testing for hereditary cancer risks. Her ancestors are mostly of Black/African American descent. There is no reported Ashkenazi Jewish ancestry. There is no known consanguinity.  GENETIC COUNSELING ASSESSMENT: Ms. Stillman is a 64 y.o. female with a personal history of endometrial cancer and a family history of colon, cervical, and brain cancer which is not suggestive of a hereditary cancer syndrome. We, therefore, discussed and recommended the following at today's visit.   DISCUSSION: We discussed that approximately 5-10% of cancer in general is hereditary, with approximately 3% of endometrial cancer having an identifiable hereditary cause. Most cases of hereditary endometrial cancer are associated with Lynch syndrome, a genetic condition that increases the risk for colon and endometrial cancers, in addition to other cancer types. Ms. Arman tumor was screened for signs of Lynch syndrome via mismatch repair protein IHC and microsatellite instability testing. Both of these screening tests were normal.   We discussed that identifying a hereditary cancer syndrome can be beneficial for several reasons, including knowing about other cancer risks, identifying  potential screening and risk-reduction options that may be appropriate, and to understand if other family members could be at risk for cancer and allow them to undergo genetic testing.  We reviewed the characteristics, features and inheritance patterns of hereditary cancer syndromes. We discussed with Ms. Keng that the personal  and family history does not meet insurance or NCCN criteria for genetic testing and, therefore, is not highly consistent with a familial hereditary cancer syndrome. We feel she is at low risk to harbor a gene mutation associated with such a condition. Despite this, Ms. Allebach feels that she would benefit from genetic testing and would like to proceed with testing. The self-pay price for genetic testing will be $250. Ms. Lynne understands and is comfortable with this cost.  We therefore discussed genetic testing, including the appropriate family members to test, the process of testing, insurance coverage and turn-around-time for results. We discussed the implications of a negative, positive and/or variant of uncertain significant result. We recommended Ms. Fahrner pursue genetic testing for the [facility] Common Hereditary Cancers Panel.   The Common Hereditary Cancers Panel offered by [facility] includes sequencing and/or deletion duplication testing of the following 48 genes: APC, ATM, AXIN2, BARD1, BMPR1A, BRCA1, BRCA2, BRIP1, CDH1, CDK4, CDKN2A (p14ARF), CDKN2A (p16INK4a), CHEK2, CTNNA1, DICER1, EPCAM (Deletion/duplication testing only), GREM1 (promoter region deletion/duplication testing only), KIT, MEN1, MLH1, MSH2, MSH3, MSH6, MUTYH, NBN, NF1, NHTL1, PALB2, PDGFRA, PMS2, POLD1, POLE, PTEN, RAD50, RAD51C, RAD51D, RNF43, SDHB, SDHC, SDHD, SMAD4, SMARCA4. STK11, TP53, TSC1, TSC2, and VHL.  The following genes are evaluated for sequence changes only: SDHA and HOXB13 c.251G>A variant only.  PLAN: After considering the risks, benefits, and limitations, Ms. Lenart provided informed consent  to pursue genetic testing and the blood sample was sent to [facility] for analysis of the Common Hereditary Cancers Panel. Results should be available within approximately two-three weeks' time, at which point they will be disclosed by telephone to Ms. Lore, as will any additional recommendations warranted by these results. Ms. Shannon will receive a summary of her genetic counseling visit and a copy of her results once available. This information will also be available in Epic.   Ms. Crites questions were answered to her satisfaction today. Our contact information was provided should additional questions or concerns arise. Thank you for the referral and allowing [place] to share in the care of your patient.   Clint Guy, [place], [facility] Licensed, Certified Dispensing [email or web address] Phone: [phone]  The patient was seen for a total of 40 minutes in face-to-face genetic counseling.  This patient was discussed with Drs. Magrinat, Lindi Adie and/or Burr Medico who agrees with the above.    _______________________________________________________________________ For Office Staff:  Number of people involved in session: 1 Was an Intern/ student involved with case: no

## 2019-09-07 NOTE — Progress Notes (Signed)
GYN Location of Tumor / Histology: Endometrial Cancer- Adenocarcinoma  Brittney Lamb presented with symptoms of: She was having postmenopausal bleeding since November that started as spotting and then became heavier like a menses.  She has bleeding intermittently, not daily.  She endorsed cramps and passage of clots.  CT Abd/Pelvis 07/21/2019: No gross extension of uterine carcinoma beyond the myometrium.  2. No pelvic lymphadenopathy.  3. No retroperitoneal periaortic adenopathy. 4. No visceral metastasis or skeletal metastasis. 5. Benign adenoma of the LEFT adrenal gland.  Benign renal cysts.  Pelvis Ultrasound 06/12/2019: Possible 1.9 cm solid ecogenic endometrial mass.  Thickened endometrial lining with solid mass.  Pap Smear 05/2019:   Biopsies of Uterus/Cervix 08/15/2019   Biopsies of endometrium 06/30/2019   Past/Anticipated interventions by Gyn/Onc surgery, if any:  Dr. Berline Lopes 07/14/2019 -The patient is a suitable candidate for staging via a minimally invasive approach to surgery. - We discussed that most endometrial cancer is detected early, however, we reviewed that adjuvant therapy will likely be recommended based on the patient's biopsy, however, we will defer to final pathology results.   -Given her high risk histology, we recommend CT scan preoperatively to rule out metastatic disease.  We will also get a CA-125 today. -I am somewhat suspicious that the patient has cervical involvement based on exam today.  While sentinel lymph node biopsy for uterine cancer is typically used only in the setting of clinical stage I disease, I recommend proceeding with sentinel lymph node biopsy in this patient. -Given her significant obesity, surgical lymph node evaluation will be challenging.  Given her high risk histology, especially if she has cervical involvement, she is going to be a candidate for adjuvant therapy. -Robotic Assisted total hysterectomy with bilateral Salpingo oophorectomy, lymph  node dissection, laparotomy. 08/15/2019  Past/Anticipated interventions by medical oncology, if any:  Dr. Alvy Bimler 09/12/2019    Weight changes, if any: No  Bowel/Bladder complaints, if any: Having some pressure with urination.  Has some constipation, having to eat prunes and drink coffee to go.  Taking miralax, this helped a little.  She is having some back pain.  Nausea/Vomiting, if any: no  Pain issues, if any:  6/10 pain from surgery.  SAFETY ISSUES:  Prior radiation? No  Pacemaker/ICD? No  Possible current pregnancy? Total Hysterectomy with Bilateral salpingo-oophorectomy 08/15/2019  Is the patient on methotrexate? No  Current Complaints / other details:

## 2019-09-10 NOTE — Progress Notes (Signed)
Radiation Oncology         [phone] ________________________________  Initial Outpatient Consultation  Name: Brittney Lamb MRN: [id number]  Date: 09/11/2019  DOB: 1955/10/22  [id number], Brittney Hauser, MD  Dorothyann Gibbs, NP   REFERRING PHYSICIAN: Dorothyann Gibbs, NP  DIAGNOSIS: The encounter diagnosis was Endometrial cancer [facility]).  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 female who is accompanied by no one due to COVID-19 restrictions. The patient presented to Chrystie Nose, NP, with complaint of dysfunctional uterine bleeding since November of 2020. Transabdominal/transvaingal ultrasound of pelvis on 06/12/2019 showed a possible 1.9 cm solid echogenic endometrial mass. The uterus was also enlarged with at least one calcified fibroid at the fundus measuring 2.6 cm. Finally, there was heterogeneous fluid within the endometrial canal that could be seen in the setting of cervical obstruction.  The patient was then seen by Dr. Kennon Rounds on 06/30/2019, during which time an endometrial biopsy was taken and revealed adenocarcinoma of the endometrium as well as adenocarcinoma of the cervical polypectomy. It was noted that the patient had a history of abnormal uterine bleeding in 2016 with a negative endometrial biopsy at that time.  The patient was referred to Dr. Berline Lopes, whom she saw in consultation on 07/14/2019. At that time, it was recommended that the patient proceed with surgical intervention/staging followed by adjuvant therapy.  Pre-procedural CT of abdomen/pelvis on 07/21/2019 did not show a gross extension of uterine carcinoma beyond the myometrium. There was no pelvic lymphadenopathy, no retroperitoneal periaortic adenopathy, and no visceral metastasis or skeletal metastasis. It did show a benign adenoma of the left adrenal gland and benign renal cysts.  The patient was  initially supposed to undergo surgery on 07/25/2019. However, she tested positive for COVID-19. Thus, her surgery was rescheduled for 08/15/2019. At that time, she underwent a robotic-assisted laparoscopic total hysterectomy with bilateral salpingo-oophorectomy, sentinel lymph node injection, bilateral pelvic LND, and mini-lap for specimen removal performed by Dr. Berline Lopes. Pathology from the procedure revealed high-grade invasive clear cell adenocarcinoma involving the outer half of the myometrium and the cervical stroma. The surgical resection margins were negative for carcinoma. There was no evidence of carcinoma in six right pelvic lymph nodes nor in seven left pelvic lymph nodes.  The patient's case was discussed at the gynecologic oncology multidisciplinary disposition conference on 08/28/2019. It was recommended that the patient proceed with chemotherapy with Cisplatin on day 1 and 28 with external beam radiation followed by four cycles of chemotherapy with Carboplatin/Taxol.  PREVIOUS RADIATION THERAPY: No  PAST MEDICAL HISTORY:  Past Medical History:  Diagnosis Date  . Arthritis    Spinal - inflammatory  . BMI 50.0-59.9, adult ([place])   . Endometrial cancer ([place])   . Family history of brain cancer   . Family history of cervical cancer   . Family history of colon cancer   . GERD (gastroesophageal reflux disease)   . Hypertension   . Morbid obesity ([place])   . Uterine cancer ([place])     PAST SURGICAL HISTORY: Past Surgical History:  Procedure Laterality Date  . cardiac catherization  2011  . LAPAROSCOPY  1996   scar tissue  . ROBOTIC ASSISTED TOTAL HYSTERECTOMY WITH BILATERAL SALPINGO OOPHERECTOMY Bilateral 08/15/2019   Procedure: XI ROBOTIC ASSISTED TOTAL HYSTERECTOMY WITH BILATERAL SALPINGO OOPHORECTOMY;  Surgeon: Lafonda Mosses, MD;  Location: [facility];  Service: Gynecology;  Laterality: Bilateral;  . SENTINEL  NODE BIOPSY N/A 08/15/2019   Procedure: LYMPH NODE DISSECTION,LAPAROTOMY;   Surgeon: Lafonda Mosses, MD;  Location: [facility];  Service: Gynecology;  Laterality: N/A;  . TONSILLECTOMY    . WISDOM TOOTH EXTRACTION      FAMILY HISTORY:  Family History  Problem Relation Age of Onset  . Heart disease Father   . Thyroid disease Father   . Hypertension Mother   . COPD Mother   . Hypertension Half-Brother   . Thyroid disease Half-Sister   . COPD Half-Sister   . Hypertension Half-Sister   . Neuropathy Half-Sister   . Cervical cancer Paternal Aunt        dx. in her late 53s  . Brain cancer Paternal Uncle 26  . Cervical cancer Paternal Grandmother        dx. in her 62s  . Uterine cancer Neg Hx   . Breast cancer Neg Hx     SOCIAL HISTORY:  Social History   Tobacco Use  . Smoking status: Never Smoker  . Smokeless tobacco: Never Used  Substance Use Topics  . Alcohol use: Not Currently    Comment: rare glass of wine  . Drug use: Never    ALLERGIES:  Allergies  Allergen Reactions  . Lisinopril Swelling  . Saxenda [Liraglutide -Weight Management] Itching and Rash    MEDICATIONS:  Current Outpatient Medications  Medication Sig Dispense Refill  . amLODipine (NORVASC) 2.5 MG tablet Take 2.5 mg by mouth daily.    . ferrous sulfate 325 (65 FE) MG tablet Take 325 mg by mouth daily with breakfast.    . hydrochlorothiazide (HYDRODIURIL) 25 MG tablet Take 25 mg by mouth daily.    Marland Kitchen ibuprofen (ADVIL) 600 MG tablet Take 1 tablet (600 mg total) by mouth every 6 (six) hours as needed for moderate pain. For AFTER surgery only 30 tablet 0  . meloxicam (MOBIC) 15 MG tablet Take 15 mg by mouth daily.    . Multiple Vitamin (MULTIVITAMIN) tablet Take 1 tablet by mouth daily.    . nitrofurantoin, macrocrystal-monohydrate, (MACROBID) 100 MG capsule Take 1 capsule (100 mg total) by mouth 2 (two) times daily. 10 capsule 0  . rosuvastatin (CRESTOR) 20 MG tablet Take 20 mg by mouth daily.    Marland Kitchen senna-docusate (SENOKOT-S) 8.6-50 MG tablet Take 2 tablets by mouth at bedtime.  For AFTER surgery, do not take if having diarrhea 60 tablet 1  . oxyCODONE (OXY IR/ROXICODONE) 5 MG immediate release tablet Take 1 tablet (5 mg total) by mouth every 4 (four) hours as needed for severe pain. For AFTER surgery only, do not take and drive (Patient not taking: Reported on 09/11/2019) 10 tablet 0   No current facility-administered medications for this encounter.    REVIEW OF SYSTEMS:  A 10+ POINT REVIEW OF SYSTEMS WAS OBTAINED including neurology, dermatology, psychiatry, cardiac, respiratory, lymph, extremities, GI, GU, musculoskeletal, constitutional, reproductive, HEENT. She denies any swelling in her legs since surgery. She denies any numbness along the areas. She denies any further vaginal bleeding but some minimal discharge.   PHYSICAL EXAM:  height is 5\' 1"  (1.549 m) and weight is 272 lb 12.8 oz (123.7 kg). Her oral temperature is 98.7 F (37.1 C). Her blood pressure is 143/75 (abnormal) and her pulse is 81. Her respiration is 18 and oxygen saturation is 99%.   General: Alert and oriented, in no acute distress HEENT: Head is normocephalic. Extraocular movements are intact.  Neck: Neck is supple, no palpable cervical or supraclavicular lymphadenopathy. Heart:  Regular in rate and rhythm with no murmurs, rubs, or gallops. Chest: Clear to auscultation bilaterally, with no rhonchi, wheezes, or rales. Abdomen: Soft, nontender, nondistended, with no rigidity or guarding. Extremities: No cyanosis or edema. Lymphatics: see Neck Exam Skin: No concerning lesions. Musculoskeletal: symmetric strength and muscle tone throughout. Neurologic: Cranial nerves II through XII are grossly intact. No obvious focalities. Speech is fluent. Coordination is intact. Psychiatric: Judgment and insight are intact. Affect is appropriate.  pelvic examination deferred in light of recent surgery.  ECOG = 1  0 - Asymptomatic (Fully active, able to carry on all predisease activities without  restriction)  1 - Symptomatic but completely ambulatory (Restricted in physically strenuous activity but ambulatory and able to carry out work of a light or sedentary nature. For example, light housework, office work)  2 - Symptomatic, <50% in bed during the day (Ambulatory and capable of all self care but unable to carry out any work activities. Up and about more than 50% of waking hours)  3 - Symptomatic, >50% in bed, but not bedbound (Capable of only limited self-care, confined to bed or chair 50% or more of waking hours)  4 - Bedbound (Completely disabled. Cannot carry on any self-care. Totally confined to bed or chair)  5 - Death   Eustace Pen MM, Creech RH, Tormey DC, et al. [phone]). "Toxicity and response criteria of the [facility] Group". [place] Oncol. 5 (6): 649-55  LABORATORY DATA:  Lab Results  Component Value Date   WBC 10.3 08/09/2019   HGB 12.2 08/09/2019   HCT 39.2 08/09/2019   MCV 91.0 08/09/2019   PLT 295 08/09/2019   Lab Results  Component Value Date   NA 139 08/09/2019   K 3.2 (L) 08/09/2019   CL 102 08/09/2019   CO2 29 08/09/2019   GLUCOSE 127 (H) 08/09/2019   CREATININE 0.91 08/09/2019   CALCIUM 9.2 08/09/2019      RADIOGRAPHY: No results found.    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   Given the pathologic findings the patient would be at risk for recurrence and I would agree with recommendations for adjuvant pelvic radiation therapy along with radiosensitizing chemotherapy during the first and fifth week of her radiation therapy (cisplatin). In addition given the extension into the cervical area would also recommend vaginal brachytherapy as a component of her treatment.  Today, I talked to the patient and about the findings and work-up thus far.  We discussed the natural history of invasive clear cell adenocarcinoma of the endometrium and general treatment,  highlighting the role of radiotherapy (external beam and vaginal brachytherapy) in the management.  We discussed the available radiation techniques, and focused on the details of logistics and delivery.  We reviewed the anticipated acute and late sequelae associated with radiation in this setting.  The patient was encouraged to ask questions that I answered to the best of my ability.  A patient consent form was discussed and signed.  We retained a copy for our records.  The patient would like to proceed with radiation and will be scheduled for CT simulation.  PLAN: Patient will be scheduled for CT simulation next week with treatments to begin approximately 6 weeks postop assuming she has had adequate healing from her surgery. She received 5 weeks of radiation therapy as above which will then be followed by vaginal brachytherapy. Her vaginal brachytherapy can occur during her 4 cycles of chemotherapy with carboplatinum  and Taxol.    ------------------------------------------------  Blair Promise, PhD, MD  This document serves as a record of services personally performed by Gery Pray, MD. It was created on his behalf by Clerance Lav, a trained medical scribe. The creation of this record is based on the scribe's personal observations and the provider's statements to them. This document has been checked and approved by the attending provider.

## 2019-09-11 ENCOUNTER — Inpatient Hospital Stay: Payer: 59

## 2019-09-11 ENCOUNTER — Ambulatory Visit
Admission: RE | Admit: 2019-09-11 | Discharge: 2019-09-11 | Disposition: A | Discharge status: Home / Self Care | Payer: 59 | Source: Ambulatory Visit | Attending: Radiation Oncology | Admitting: Radiation Oncology

## 2019-09-11 ENCOUNTER — Other Ambulatory Visit: Payer: Self-pay

## 2019-09-11 ENCOUNTER — Other Ambulatory Visit: Payer: Self-pay | Admitting: Genetic Counselor

## 2019-09-11 ENCOUNTER — Encounter: Payer: Self-pay | Admitting: Radiation Oncology

## 2019-09-11 VITALS — BP 143/75 | HR 81 | Temp 98.7°F | Resp 18 | Ht 61.0 in | Wt 272.8 lb

## 2019-09-11 DIAGNOSIS — Z90710 Acquired absence of both cervix and uterus: Secondary | ICD-10-CM | POA: Insufficient documentation

## 2019-09-11 DIAGNOSIS — Z9221 Personal history of antineoplastic chemotherapy: Secondary | ICD-10-CM | POA: Insufficient documentation

## 2019-09-11 DIAGNOSIS — Z923 Personal history of irradiation: Secondary | ICD-10-CM | POA: Insufficient documentation

## 2019-09-11 DIAGNOSIS — Z90722 Acquired absence of ovaries, bilateral: Secondary | ICD-10-CM | POA: Insufficient documentation

## 2019-09-11 DIAGNOSIS — K219 Gastro-esophageal reflux disease without esophagitis: Secondary | ICD-10-CM | POA: Insufficient documentation

## 2019-09-11 DIAGNOSIS — Z79899 Other long term (current) drug therapy: Secondary | ICD-10-CM | POA: Insufficient documentation

## 2019-09-11 DIAGNOSIS — C541 Malignant neoplasm of endometrium: Secondary | ICD-10-CM

## 2019-09-11 DIAGNOSIS — Z808 Family history of malignant neoplasm of other organs or systems: Secondary | ICD-10-CM | POA: Insufficient documentation

## 2019-09-11 DIAGNOSIS — M129 Arthropathy, unspecified: Secondary | ICD-10-CM | POA: Insufficient documentation

## 2019-09-11 DIAGNOSIS — I10 Essential (primary) hypertension: Secondary | ICD-10-CM | POA: Insufficient documentation

## 2019-09-11 DIAGNOSIS — E669 Obesity, unspecified: Secondary | ICD-10-CM | POA: Insufficient documentation

## 2019-09-11 DIAGNOSIS — D3502 Benign neoplasm of left adrenal gland: Secondary | ICD-10-CM | POA: Insufficient documentation

## 2019-09-11 LAB — GENETIC SCREENING ORDER

## 2019-09-11 NOTE — Progress Notes (Signed)
Gynecologic Oncology Return Clinic Visit  4/27  Reason for Visit: Postop follow-up and treatment planning  Treatment History: Oncology History Overview Note  MSI-stable Final pathology showed clear cell carcinoma   Endometrial cancer ([place])  04/17/2019 Initial Diagnosis   She presented with abnormal post menopausal bleeding   06/12/2019 Imaging   US pelvis 1. Possible 1.9 cm solid echogenic endometrial mass. In the setting of post-menopausal bleeding, endometrial sampling is indicated to exclude carcinoma. I 2. Enlarged uterus with at least 1 calcified fibroid at the fundus measuring 2.6 cm. 3. Heterogenous fluid within the endometrial canal, can be seen in the setting of cervical obstruction. 4. Nonvisualized ovaries   06/30/2019 Initial Biopsy   EMB: high grade adenocarcinoma   07/14/2019 Tumor Marker   CA-125: 354   07/21/2019 Imaging   CT A/P: IMPRESSION: 1. No gross extension of uterine carcinoma beyond the myometrium. 2. No pelvic lymphadenopathy. 3. No retroperitoneal periaortic adenopathy. 4. No visceral metastasis or skeletal metastasis. 5. Benign adenoma of the LEFT adrenal gland.  Benign renal cysts.   08/15/2019 Surgery   Robotic-assisted laparoscopic total hysterectomy with bilateral salpingoophorectomy, SLN injection, bilateral pelvic LND, mini-lap for specimen removal Modifier 22: significant obesity causing difficulty visualizing anatomy, increasing OR time    08/15/2019 Pathology Results   A. 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  - The surgical resection margins are negative for carcinoma.  - See oncology table below.   Myometrium: Leiomyomata.  Serosa: Unremarkable.  Bilateral adnexa: Benign ovaries and fallopian tubes with endometriosis.   B. LYMPH  NODES, RIGHT PELVIC, RESECTION:  - There is no evidence of carcinoma in 6 of 6 lymph nodes (0/6).   C. LYMPH NODES, LEFT PELVIC, RESECTION:  - There is no evidence of carcinoma in 7 of 7 lymph nodes (0/7).    ONCOLOGY TABLE:   UTERUS, CARCINOMA OR CARCINOSARCOMA   Procedure: Total hysterectomy and bilateral salpingo-oophorectomy  Histologic type: Clear cell adenocarcinoma  Histologic Grade: High-grade  Myometrial invasion:       Depth of invasion: 19.5 mm       Myometrial thickness: 20 mm  Uterine Serosa Involvement: Not definitively identified  Cervical stromal involvement: Present  Extent of involvement of other organs: Confined to lower uterine segment  Lymphovascular invasion: Not identified  Regional Lymph Nodes:       Examined:        0 Sentinel                               13 non-sentinel                               13 total        Lymph nodes with metastasis: 0  Representative Tumor Block: A17  MMR / MSI testing: Can be ordered upon clinician request.  Pathologic Stage Classification (pTNM, AJCC 8th edition):  pT2, pN0  (FIGO stage II)  Comments: Dr. Claudette Laws has reviewed selected slides and concurs with  the phenotype of this tumor.  Additional studies can be performed upon  clinician request.    08/15/2019 Cancer Staging   Staging form: Corpus Uteri - Carcinoma and Carcinosarcoma, AJCC 8th Edition - Clinical stage from 08/15/2019: FIGO Stage II (  cT2, cN0, cM0) - Signed by Lafonda Mosses, MD on 08/23/2019   09/25/2019 -  Chemotherapy   The patient had palonosetron (ALOXI) injection 0.25 mg, 0.25 mg, Intravenous,  Once, 0 of 2 cycles CISplatin (PLATINOL) 92 mg in sodium chloride 0.9 % 250 mL chemo infusion, 40 mg/m2 = 92 mg, Intravenous,  Once, 0 of 2 cycles fosaprepitant (EMEND) 150 mg in sodium chloride 0.9 % 145 mL IVPB, 150 mg, Intravenous,  Once, 0 of 2 cycles  for chemotherapy treatment.      Interval History: Patient reports overall doing well  since surgery.  She endorses some lower abdominal pain that has improved with time as well as constipation.  She denies any vaginal bleeding or discharge.  She denies any fevers or chills.  She reports having a good appetite without nausea or vomiting.  She denies any urinary symptoms.  She has had normal bowel movements the last 2 days that she describes as having a normal consistency.  Prior to that, she was having bowel movement every 2-3 days and feeling a lot of pressure and rectal pain with her bowel function.  Past Medical/Surgical History: Past Medical History:  Diagnosis Date  . Arthritis    Spinal - inflammatory  . BMI 50.0-59.9, adult ([place])   . Endometrial cancer ([place])   . Family history of brain cancer   . Family history of cervical cancer   . Family history of colon cancer   . GERD (gastroesophageal reflux disease)   . Hypertension   . Morbid obesity ([place])   . Uterine cancer [facility])     Past Surgical History:  Procedure Laterality Date  . cardiac catherization  2011  . LAPAROSCOPY  1996   scar tissue  . ROBOTIC ASSISTED TOTAL HYSTERECTOMY WITH BILATERAL SALPINGO OOPHERECTOMY Bilateral 08/15/2019   Procedure: XI ROBOTIC ASSISTED TOTAL HYSTERECTOMY WITH BILATERAL SALPINGO OOPHORECTOMY;  Surgeon: Lafonda Mosses, MD;  Location: [facility];  Service: Gynecology;  Laterality: Bilateral;  . SENTINEL NODE BIOPSY N/A 08/15/2019   Procedure: LYMPH NODE DISSECTION,LAPAROTOMY;  Surgeon: Lafonda Mosses, MD;  Location: [facility];  Service: Gynecology;  Laterality: N/A;  . TONSILLECTOMY    . WISDOM TOOTH EXTRACTION      Family History  Problem Relation Age of Onset  . Heart disease Father   . Thyroid disease Father   . Hypertension Mother   . COPD Mother   . Hypertension Half-Brother   . Thyroid disease Half-Sister   . COPD Half-Sister   . Hypertension Half-Sister   . Neuropathy Half-Sister   . Cervical cancer Paternal Aunt        dx. in her late 26s  . Brain cancer Paternal  Uncle 72  . Cervical cancer Paternal Grandmother        dx. in her 24s  . Uterine cancer Neg Hx   . Breast cancer Neg Hx     Social History   Socioeconomic History  . Marital status: Married    Spouse name: Not on file  . Number of children: Not on file  . Years of education: Not on file  . Highest education level: Not on file  Occupational History  . Not on file  Tobacco Use  . Smoking status: Never Smoker  . Smokeless tobacco: Never Used  Substance and Sexual Activity  . Alcohol use: Not Currently    Comment: rare glass of wine  . Drug use: Never  . Sexual activity: Not Currently  Other Topics Concern  .  Not on file  Social History Narrative  . Not on file   Social Determinants of Health   Financial Resource Strain:   . Difficulty of Paying Living Expenses:   Food Insecurity:   . Worried About Charity fundraiser in the Last Year:   . Arboriculturist in the Last Year:   Transportation Needs:   . Film/video editor (Medical):   Marland Kitchen Lack of Transportation (Non-Medical):   Physical Activity:   . Days of Exercise per Week:   . Minutes of Exercise per Session:   Stress:   . Feeling of Stress :   Social Connections:   . Frequency of Communication with Friends and Family:   . Frequency of Social Gatherings with Friends and Family:   . Attends Religious Services:   . Active Member of Clubs or Organizations:   . Attends Archivist Meetings:   Marland Kitchen Marital Status:     Current Medications:  Current Outpatient Medications:  .  amLODipine (NORVASC) 2.5 MG tablet, Take 2.5 mg by mouth daily., Disp: , Rfl:  .  hydrochlorothiazide (HYDRODIURIL) 25 MG tablet, Take 25 mg by mouth daily., Disp: , Rfl:  .  meloxicam (MOBIC) 15 MG tablet, Take 15 mg by mouth daily., Disp: , Rfl:  .  Multiple Vitamin (MULTIVITAMIN) tablet, Take 1 tablet by mouth daily., Disp: , Rfl:  .  rosuvastatin (CRESTOR) 20 MG tablet, Take 20 mg by mouth daily., Disp: , Rfl:   Review of  Systems: + abdominal pain and constipation Denies appetite changes, fevers, chills, fatigue, unexplained weight changes. Denies hearing loss, neck lumps or masses, mouth sores, ringing in ears or voice changes. Denies cough or wheezing.  Denies shortness of breath. Denies chest pain or palpitations. Denies leg swelling. Denies abdominal distention, blood in stools, diarrhea, nausea, vomiting, or early satiety. Denies pain with intercourse, dysuria, frequency, hematuria or incontinence. Denies hot flashes, pelvic pain, vaginal bleeding or vaginal discharge.   Denies joint pain, back pain or muscle pain/cramps. Denies itching, rash, or wounds. Denies dizziness, headaches, numbness or seizures. Denies swollen lymph nodes or glands, denies easy bruising or bleeding. Denies anxiety, depression, confusion, or decreased concentration.  Physical Exam: BP 138/70 (BP Location: Left Arm, Patient Position: Sitting)   Pulse 73   Temp 98.3 F (36.8 C)   Ht '5\' 1"'  (1.549 m)   Wt 272 lb (123.4 kg)   SpO2 100%   BMI 51.39 kg/m  General: Alert, oriented, no acute distress. HEENT: Normocephalic, atraumatic, sclera anicteric. Chest: Unlabored breathing on room air. Abdomen: Obese, soft, nontender.  Normoactive bowel sounds.  No masses or hepatosplenomegaly appreciated.  Well-healing laparoscopic incisions and mini lap site. Extremities: Grossly normal range of motion.  Warm, well perfused.  No edema bilaterally. Skin: No rashes or lesions noted. GU: Normal appearing external genitalia without erythema, excoriation, or lesions.  Speculum exam reveals well-healing cuff with suture still visible, no bleeding or discharge.  Bimanual exam reveals cuff intact although some fullness/fluctuance in the cul-de-sac also appreciated on rectovaginal exam.  Laboratory & Radiologic Studies: None new  Assessment & Plan: Brittney Lamb is a 64 y.o. woman with Stage II clear cell adenocarcinoma of the uterus who  presents for post-op follow up.  Patient is overall doing well after surgery.  Incisions are healing well.  Given her ongoing GI symptoms as well as fullness/fluctuance felt on my exam today, I am recommending a CT of the pelvis to evaluate for possible post surgery  hematoma or fluid collection.  I will call the patient with these results.  She is scheduled to start adjuvant therapy with external beam radiation and radiosensitizing cisplatin, vaginal brachytherapy followed by 4 cycles of platinum/taxane systemic therapy.  Per SGO surveillance recommendations, we will plan to see the patient every 3 months for 2 years after completion of therapy.  We discussed signs and symptoms that would be concerning for recurrence and the patient knows to call if she develops any of these.  15 minutes of total time was spent for this patient encounter, including preparation, face-to-face counseling with the patient and coordination of care, and documentation of the encounter.  Jeral Pinch, MD  Division of Gynecologic Oncology  Department of Obstetrics and Gynecology  [facility]

## 2019-09-12 ENCOUNTER — Other Ambulatory Visit: Payer: Self-pay

## 2019-09-12 ENCOUNTER — Telehealth: Payer: Self-pay | Admitting: Oncology

## 2019-09-12 ENCOUNTER — Inpatient Hospital Stay (HOSPITAL_BASED_OUTPATIENT_CLINIC_OR_DEPARTMENT_OTHER): Payer: 59 | Admitting: Hematology and Oncology

## 2019-09-12 ENCOUNTER — Telehealth: Payer: Self-pay | Admitting: Hematology and Oncology

## 2019-09-12 ENCOUNTER — Inpatient Hospital Stay (HOSPITAL_BASED_OUTPATIENT_CLINIC_OR_DEPARTMENT_OTHER): Payer: 59 | Admitting: Gynecologic Oncology

## 2019-09-12 ENCOUNTER — Encounter: Payer: Self-pay | Admitting: Hematology and Oncology

## 2019-09-12 ENCOUNTER — Encounter: Payer: Self-pay | Admitting: Oncology

## 2019-09-12 ENCOUNTER — Encounter: Payer: Self-pay | Admitting: Gynecologic Oncology

## 2019-09-12 ENCOUNTER — Other Ambulatory Visit: Payer: Self-pay | Admitting: Hematology and Oncology

## 2019-09-12 ENCOUNTER — Inpatient Hospital Stay: Payer: 59

## 2019-09-12 VITALS — BP 138/70 | HR 73 | Temp 98.3°F | Ht 61.0 in | Wt 272.0 lb

## 2019-09-12 VITALS — BP 138/70 | HR 73 | Temp 98.3°F | Resp 18 | Ht 61.0 in | Wt 272.2 lb

## 2019-09-12 DIAGNOSIS — C541 Malignant neoplasm of endometrium: Secondary | ICD-10-CM | POA: Diagnosis not present

## 2019-09-12 DIAGNOSIS — K5909 Other constipation: Secondary | ICD-10-CM

## 2019-09-12 DIAGNOSIS — R198 Other specified symptoms and signs involving the digestive system and abdomen: Secondary | ICD-10-CM

## 2019-09-12 MED ORDER — ONDANSETRON HCL 8 MG PO TABS: 8.0000 mg | ORAL_TABLET | Freq: Two times a day (BID) | ORAL | 1 refills | Status: DC | PRN

## 2019-09-12 MED ORDER — PROCHLORPERAZINE MALEATE 10 MG PO TABS: 10.0000 mg | ORAL_TABLET | Freq: Four times a day (QID) | ORAL | 1 refills | Status: DC | PRN

## 2019-09-12 MED ORDER — LIDOCAINE-PRILOCAINE 2.5-2.5 % EX CREA: TOPICAL_CREAM | CUTANEOUS | 3 refills | Status: DC

## 2019-09-12 NOTE — Assessment & Plan Note (Signed)
She is at high risk for constipation We discussed the importance of aggressive laxative therapy

## 2019-09-12 NOTE — Progress Notes (Signed)
Met with [name] and her daughter after initial medical oncology appointment. Gave her the [employer] and encouraged her to call with any questions or concerns.

## 2019-09-12 NOTE — Progress Notes (Signed)
START OFF PATHWAY REGIMEN - Uterine   Custom Intervention:Cisplatin 35 mg/m2 days 1and 28 + RT:     Cisplatin   **Always confirm dose/schedule in your pharmacy ordering system**  Patient Characteristics: Clear Cell, Newly Diagnosed, Postoperative (Pathologic Staging), Postoperative Histology: Clear Cell Therapeutic Status: Newly Diagnosed, Postoperative (Pathologic Staging) AJCC T Category: pT2 AJCC N Category: pN0 AJCC 8 Stage Grouping: II AJCC M Category: cM0 Intent of Therapy: Curative Intent, Discussed with Patient

## 2019-09-12 NOTE — Progress Notes (Signed)
[place] NOTE  Patient Care Team: [name], MD as PCP - General (Family Medicine) Awanda Mink Craige Cotta, RN as Oncology Nurse Navigator (Oncology)  ASSESSMENT & PLAN:  Endometrial cancer [facility]) We discussed the role of concurrent chemotherapy with radiation followed by chemotherapy for treatment approach of endometrial cancer  We reviewed the NCCN guidelines using multimodality treatment  The decision is based on publication on [id number] study.   We discussed the role of chemotherapy. The intent is of curative intent.  We discussed some of the risks, benefits, side-effects of cisplatin on days 1 and 28 with radiation followed by 4 cycles of carboplatin & Taxol (initial dose of carboplatin at AUC of 5, to be escalated to AUC of 6 for rest of cycles if her blood count recovers). Treatment is intravenous  Some of the short term side-effects included, though not limited to, including weight loss, life threatening infections, risk of allergic reactions, renal failure, need for transfusions of blood products, nausea, vomiting, change in bowel habits, loss of hair, admission to hospital for various reasons, and risks of death.   Long term side-effects are also discussed including risks of infertility, permanent damage to nerve function, hearing loss, chronic fatigue, kidney damage with possibility needing hemodialysis, and rare secondary malignancy including bone marrow disorders.  The patient is aware that the response rates discussed earlier is not guaranteed.  After a long discussion, patient made an informed decision to proceed with the prescribed plan of care.   Patient education material was dispensed. I recommend port placement and chemo education class I will tentatively schedule her to start treatment on May 10th I will see her back the following week for toxicity review Due to her class III obesity, I will adjust the dose of chemotherapy a little bit to avoid major  side effects   Obesity, Class III, BMI 40-49.9 (morbid obesity) ([place]) Due to her elevated BSA, I plan to revise and use adjusted dosing so that the calculated total dose of chemotherapy is Under 75 mg  Other constipation She is at high risk for constipation We discussed the importance of aggressive laxative therapy   Orders Placed This Encounter  Procedures  . IR IMAGING GUIDED PORT INSERTION    Standing Status:   Future    Standing Expiration Date:   11/11/2020    Order Specific Question:   Reason for Exam (SYMPTOM  OR DIAGNOSIS REQUIRED)    Answer:   need port for chemo to start 5/10    Order Specific Question:   Preferred Imaging Location?    Answer:   [facility]  . Comprehensive metabolic panel    Standing Status:   Standing    Number of Occurrences:   9    Standing Expiration Date:   09/11/2020  . Magnesium    Standing Status:   Standing    Number of Occurrences:   9    Standing Expiration Date:   09/11/2020    The total time spent in the appointment was 60 minutes encounter with patients including review of chart and various tests results, discussions about plan of care and coordination of care plan   All questions were answered. The patient knows to call the clinic with any problems, questions or concerns. No barriers to learning was detected.  Heath Lark, MD 4/27/20212:13 PM  CHIEF COMPLAINTS/PURPOSE OF CONSULTATION:  Uterine cancer, clear cell pathology, for adjuvant treatment  HISTORY OF PRESENTING ILLNESS:  Brittney Lamb 64 y.o. female  is here because of recent diagnosis of uterine cancer Her daughter, Caryl Pina is also present She started to have postmenopausal bleeding end of last year, leading to further evaluation and subsequent surgery She is doing well after surgery except for some deep lower pelvic pain that comes and goes and some mild constipation since surgery  I have reviewed her chart and materials related to her cancer extensively and  collaborated history with the patient. 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 Possible 1.9 cm solid echogenic endometrial mass. In the setting of post-menopausal bleeding, endometrial sampling is indicated to exclude carcinoma. I 2. Enlarged uterus with at least 1 calcified fibroid at the fundus measuring 2.6 cm. 3. Heterogenous fluid within the endometrial canal, can be seen in the setting of cervical obstruction. 4. Nonvisualized ovaries   06/30/2019 Initial Biopsy   EMB: high grade adenocarcinoma   07/14/2019 Tumor Marker   CA-125: 354   07/21/2019 Imaging   CT A/P: IMPRESSION: 1. No gross extension of uterine carcinoma beyond the myometrium. 2. No pelvic lymphadenopathy. 3. No retroperitoneal periaortic adenopathy. 4. No visceral metastasis or skeletal metastasis. 5. Benign adenoma of the LEFT adrenal gland.  Benign renal cysts.   08/15/2019 Surgery   Robotic-assisted laparoscopic total hysterectomy with bilateral salpingoophorectomy, SLN injection, bilateral pelvic LND, mini-lap for specimen removal Modifier 22: significant obesity causing difficulty visualizing anatomy, increasing OR time    08/15/2019 Pathology Results   A. 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  - The surgical resection margins are negative for carcinoma.  - See oncology table below.   Myometrium: Leiomyomata.  Serosa: Unremarkable.  Bilateral adnexa: Benign ovaries and fallopian tubes with endometriosis.   B. LYMPH NODES, RIGHT PELVIC, RESECTION:  - There is no  evidence of carcinoma in 6 of 6 lymph nodes (0/6).   C. LYMPH NODES, LEFT PELVIC, RESECTION:  - There is no evidence of carcinoma in 7 of 7 lymph nodes (0/7).    ONCOLOGY TABLE:   UTERUS, CARCINOMA OR CARCINOSARCOMA   Procedure: Total hysterectomy and bilateral salpingo-oophorectomy  Histologic type: Clear cell adenocarcinoma  Histologic Grade: High-grade  Myometrial invasion:       Depth of invasion: 19.5 mm       Myometrial thickness: 20 mm  Uterine Serosa Involvement: Not definitively identified  Cervical stromal involvement: Present  Extent of involvement of other organs: Confined to lower uterine segment  Lymphovascular invasion: Not identified  Regional Lymph Nodes:       Examined:        0 Sentinel                               13 non-sentinel                               13 total        Lymph nodes with metastasis: 0  Representative Tumor Block: A17  MMR / MSI testing: Can be ordered upon clinician request.  Pathologic Stage Classification (pTNM, AJCC 8th  edition):  pT2, pN0  (FIGO stage II)  Comments: Dr. John Patrick has reviewed selected slides and concurs with  the phenotype of this tumor.  Additional studies can be performed upon  clinician request.    08/15/2019 Cancer Staging   Staging form: Corpus Uteri - Carcinoma and Carcinosarcoma, AJCC 8th Edition - Clinical stage from 08/15/2019: FIGO Stage II (cT2, cN0, cM0) - Signed by Tucker, Katherine R, MD on 08/23/2019     MEDICAL HISTORY:  Past Medical History:  Diagnosis Date  . Arthritis    Spinal - inflammatory  . BMI 50.0-59.9, adult (HCC)   . Endometrial cancer (HCC)   . Family history of brain cancer   . Family history of cervical cancer   . Family history of colon cancer   . GERD (gastroesophageal reflux disease)   . Hypertension   . Morbid obesity (HCC)   . Uterine cancer (HCC)     SURGICAL HISTORY: Past Surgical History:  Procedure Laterality Date  . cardiac catherization  2011  .  LAPAROSCOPY  1996   scar tissue  . ROBOTIC ASSISTED TOTAL HYSTERECTOMY WITH BILATERAL SALPINGO OOPHERECTOMY Bilateral 08/15/2019   Procedure: XI ROBOTIC ASSISTED TOTAL HYSTERECTOMY WITH BILATERAL SALPINGO OOPHORECTOMY;  Surgeon: Tucker, Katherine R, MD;  Location: [facility];  Service: Gynecology;  Laterality: Bilateral;  . SENTINEL NODE BIOPSY N/A 08/15/2019   Procedure: LYMPH NODE DISSECTION,LAPAROTOMY;  Surgeon: Tucker, Katherine R, MD;  Location: [facility];  Service: Gynecology;  Laterality: N/A;  . TONSILLECTOMY    . WISDOM TOOTH EXTRACTION      SOCIAL HISTORY: Social History   Socioeconomic History  . Marital status: Married    Spouse name: Not on file  . Number of children: Not on file  . Years of education: Not on file  . Highest education level: Not on file  Occupational History  . Not on file  Tobacco Use  . Smoking status: Never Smoker  . Smokeless tobacco: Never Used  Substance and Sexual Activity  . Alcohol use: Not Currently    Comment: rare glass of wine  . Drug use: Never  . Sexual activity: Not Currently  Other Topics Concern  . Not on file  Social History Narrative  . Not on file   Social Determinants of Health   Financial Resource Strain:   . Difficulty of Paying Living Expenses:   Food Insecurity:   . Worried About Running Out of Food in the Last Year:   . Ran Out of Food in the Last Year:   Transportation Needs:   . Lack of Transportation (Medical):   . Lack of Transportation (Non-Medical):   Physical Activity:   . Days of Exercise per Week:   . Minutes of Exercise per Session:   Stress:   . Feeling of Stress :   Social Connections:   . Frequency of Communication with Friends and Family:   . Frequency of Social Gatherings with Friends and Family:   . Attends Religious Services:   . Active Member of Clubs or Organizations:   . Attends Club or Organization Meetings:   . Marital Status:   Intimate Partner Violence:   . Fear of Current or Ex-Partner:    . Emotionally Abused:   . Physically Abused:   . Sexually Abused:     FAMILY HISTORY: Family History  Problem Relation Age of Onset  . Heart disease Father   . Thyroid disease Father   . Hypertension Mother   . COPD Mother   .   Hypertension Half-Brother   . Thyroid disease Half-Sister   . COPD Half-Sister   . Hypertension Half-Sister   . Neuropathy Half-Sister   . Cervical cancer Paternal Aunt        dx. in her late 30s  . Brain cancer Paternal Uncle 55  . Cervical cancer Paternal Grandmother        dx. in her 80s  . Uterine cancer Neg Hx   . Breast cancer Neg Hx     ALLERGIES:  is allergic to lisinopril and saxenda [liraglutide -weight management].  MEDICATIONS:  Current Outpatient Medications  Medication Sig Dispense Refill  . amLODipine (NORVASC) 2.5 MG tablet Take 2.5 mg by mouth daily.    . hydrochlorothiazide (HYDRODIURIL) 25 MG tablet Take 25 mg by mouth daily.    . lidocaine-prilocaine (EMLA) cream Apply to affected area once 30 g 3  . meloxicam (MOBIC) 15 MG tablet Take 15 mg by mouth daily.    . Multiple Vitamin (MULTIVITAMIN) tablet Take 1 tablet by mouth daily.    . ondansetron (ZOFRAN) 8 MG tablet Take 1 tablet (8 mg total) by mouth 2 (two) times daily as needed. Start on the third day after chemotherapy. 30 tablet 1  . prochlorperazine (COMPAZINE) 10 MG tablet Take 1 tablet (10 mg total) by mouth every 6 (six) hours as needed (Nausea or vomiting). 30 tablet 1  . rosuvastatin (CRESTOR) 20 MG tablet Take 20 mg by mouth daily.     No current facility-administered medications for this visit.    REVIEW OF SYSTEMS:   Constitutional: Denies fevers, chills or abnormal night sweats Eyes: Denies blurriness of vision, double vision or watery eyes Ears, nose, mouth, throat, and face: Denies mucositis or sore throat Respiratory: Denies cough, dyspnea or wheezes Cardiovascular: Denies palpitation, chest discomfort or lower extremity swelling Skin: Denies abnormal  skin rashes Lymphatics: Denies new lymphadenopathy or easy bruising Neurological:Denies numbness, tingling or new weaknesses Behavioral/Psych: Mood is stable, no new changes  All other systems were reviewed with the patient and are negative.  PHYSICAL EXAMINATION: ECOG PERFORMANCE STATUS: 1 - Symptomatic but completely ambulatory  Vitals:   09/12/19 1224  BP: 138/70  Pulse: 73  Resp: 18  Temp: 98.3 F (36.8 C)  SpO2: 100%   Filed Weights   09/12/19 1224  Weight: 272 lb 3.2 oz (123.5 kg)    GENERAL:alert, no distress and comfortable SKIN: skin color, texture, turgor are normal, no rashes or significant lesions EYES: normal, conjunctiva are pink and non-injected, sclera clear OROPHARYNX:no exudate, no erythema and lips, buccal mucosa, and tongue normal  NECK: supple, thyroid normal size, non-tender, without nodularity LYMPH:  no palpable lymphadenopathy in the cervical, axillary or inguinal LUNGS: clear to auscultation and percussion with normal breathing effort HEART: regular rate & rhythm and no murmurs and no lower extremity edema ABDOMEN:abdomen soft, non-tender and normal bowel sounds.  Noted well-healed surgical scar Musculoskeletal:no cyanosis of digits and no clubbing  PSYCH: alert & oriented x 3 with fluent speech NEURO: no focal motor/sensory deficits  LABORATORY DATA:  I have reviewed the data as listed Lab Results  Component Value Date   WBC 10.3 08/09/2019   HGB 12.2 08/09/2019   HCT 39.2 08/09/2019   MCV 91.0 08/09/2019   PLT 295 08/09/2019   Recent Labs    07/14/19 1252 08/09/19 1423  NA 142 139  K 3.5 3.2*  CL 103 102  CO2 31 29  GLUCOSE 94 127*  BUN 14 17  CREATININE 0.97 0.91    CALCIUM 9.6 9.2  GFRNONAA >60 >60  GFRAA >60 >60  PROT 7.6  --   ALBUMIN 3.8  --   AST 18  --   ALT 14  --   ALKPHOS 81  --   BILITOT 0.5  --     RADIOGRAPHIC STUDIES: I have reviewed her recent imaging study I have personally reviewed the radiological images  as listed and agreed with the findings in the report.

## 2019-09-12 NOTE — Patient Instructions (Signed)
Your incisions are healing well.  I will call you with your CT results.  I will plan to see you after you finish radiation and chemotherapy treatment.  If you have any questions or concerns, please call the clinic at [phone].

## 2019-09-12 NOTE — Telephone Encounter (Signed)
Advised [name] that Dr. Alvy Bimler has sent her premedications to [place] and that the schedulers will call her with the chemotherapy appointments.  She verbalized agreement.

## 2019-09-12 NOTE — Telephone Encounter (Signed)
Scheduled appts per 4/27 sch msg. Left voicemail with new appt dates and times.

## 2019-09-12 NOTE — Assessment & Plan Note (Signed)
Due to her elevated BSA, I plan to revise and use adjusted dosing so that the calculated total dose of chemotherapy is Under 75 mg

## 2019-09-12 NOTE — Assessment & Plan Note (Addendum)
We discussed the role of concurrent chemotherapy with radiation followed by chemotherapy for treatment approach of endometrial cancer  We reviewed the NCCN guidelines using multimodality treatment  The decision is based on publication on [id number] study.   We discussed the role of chemotherapy. The intent is of curative intent.  We discussed some of the risks, benefits, side-effects of cisplatin on days 1 and 28 with radiation followed by 4 cycles of carboplatin & Taxol (initial dose of carboplatin at AUC of 5, to be escalated to AUC of 6 for rest of cycles if her blood count recovers). Treatment is intravenous  Some of the short term side-effects included, though not limited to, including weight loss, life threatening infections, risk of allergic reactions, renal failure, need for transfusions of blood products, nausea, vomiting, change in bowel habits, loss of hair, admission to hospital for various reasons, and risks of death.   Long term side-effects are also discussed including risks of infertility, permanent damage to nerve function, hearing loss, chronic fatigue, kidney damage with possibility needing hemodialysis, and rare secondary malignancy including bone marrow disorders.  The patient is aware that the response rates discussed earlier is not guaranteed.  After a long discussion, patient made an informed decision to proceed with the prescribed plan of care.   Patient education material was dispensed. I recommend port placement and chemo education class I will tentatively schedule her to start treatment on May 10th I will see her back the following week for toxicity review Due to her class III obesity, I will adjust the dose of chemotherapy a little bit to avoid major side effects

## 2019-09-13 ENCOUNTER — Encounter: Payer: Self-pay | Admitting: *Deleted

## 2019-09-13 NOTE — Progress Notes (Signed)
[place] Psychosocial Distress Screening Clinical Social Work  Clinical Social Work was referred by distress screening protocol.  The patient scored a 6 on the Psychosocial Distress Thermometer which indicates moderate distress. Clinical Social Worker contacted patient by phone to assess for distress and other psychosocial needs. Ms. Guard reported her anxiety was associated with coordinating her appointments and rides to treatment.  She identified many supportive family members that are available to assist, but she does not want to "burden them".  CSW shared this is a very normal reaction many cancer patients experience.  CSW and patient talked through options and discussed other cancer related questions.  Ms. Gabhart lives with her sister in [place]- she identified her daughter as a strong support.  ONCBCN DISTRESS SCREENING 09/11/2019  Screening Type Initial Screening  Distress experienced in past week (1-10) 6  Emotional problem type Nervousness/Anxiety  Spiritual/Religous concerns type Loss of sense of purpose  Physical Problem type Pain;Constipation/diarrhea;Skin dry/itchy  Other Contact via cell phone [phone]    Clinical Social Worker follow up needed: No.  If yes, follow up plan:  Gwinda Maine, LCSW

## 2019-09-18 ENCOUNTER — Ambulatory Visit (HOSPITAL_COMMUNITY)
Admission: RE | Admit: 2019-09-18 | Discharge: 2019-09-18 | Disposition: A | Discharge status: Home / Self Care | Payer: 59 | Source: Ambulatory Visit | Attending: Gynecologic Oncology | Admitting: Gynecologic Oncology

## 2019-09-18 ENCOUNTER — Encounter (HOSPITAL_COMMUNITY): Payer: Self-pay

## 2019-09-18 ENCOUNTER — Other Ambulatory Visit: Payer: Self-pay

## 2019-09-18 DIAGNOSIS — C541 Malignant neoplasm of endometrium: Secondary | ICD-10-CM | POA: Diagnosis not present

## 2019-09-18 MED ORDER — IOHEXOL 300 MG/ML  SOLN
100.0000 mL | Freq: Once | INTRAMUSCULAR | Status: AC | PRN
  Administered 2019-09-18: 100 mL via INTRAVENOUS

## 2019-09-18 MED ORDER — IOHEXOL 9 MG/ML PO SOLN
500.0000 mL | ORAL | Status: AC
  Administered 2019-09-18 (×2): 500 mL via ORAL

## 2019-09-18 MED ORDER — SODIUM CHLORIDE (PF) 0.9 % IJ SOLN
INTRAMUSCULAR | Status: AC
  Filled 2019-09-18: qty 50

## 2019-09-18 MED ORDER — IOHEXOL 9 MG/ML PO SOLN
ORAL | Status: AC
  Filled 2019-09-18: qty 1000

## 2019-09-19 ENCOUNTER — Telehealth: Payer: Self-pay | Admitting: Oncology

## 2019-09-19 ENCOUNTER — Encounter: Payer: Self-pay | Admitting: Oncology

## 2019-09-19 ENCOUNTER — Other Ambulatory Visit: Payer: Self-pay | Admitting: Radiology

## 2019-09-19 ENCOUNTER — Ambulatory Visit
Admission: RE | Admit: 2019-09-19 | Discharge: 2019-09-19 | Disposition: A | Discharge status: Home / Self Care | Payer: 59 | Source: Ambulatory Visit | Attending: Radiation Oncology | Admitting: Radiation Oncology

## 2019-09-19 ENCOUNTER — Other Ambulatory Visit: Payer: Self-pay

## 2019-09-19 ENCOUNTER — Ambulatory Visit
Admission: RE | Admit: 2019-09-19 | Discharge: 2019-09-19 | Disposition: A | Discharge status: Home / Self Care | Payer: 59 | Source: Ambulatory Visit | Attending: Gynecologic Oncology | Admitting: Gynecologic Oncology

## 2019-09-19 ENCOUNTER — Telehealth: Payer: Self-pay | Admitting: Gynecologic Oncology

## 2019-09-19 DIAGNOSIS — Z510 Encounter for antineoplastic radiation therapy: Secondary | ICD-10-CM | POA: Diagnosis not present

## 2019-09-19 DIAGNOSIS — C541 Malignant neoplasm of endometrium: Secondary | ICD-10-CM | POA: Insufficient documentation

## 2019-09-19 NOTE — Progress Notes (Signed)
Pharmacist Chemotherapy Monitoring - Initial Assessment    Anticipated start date: 09/25/19   Regimen:  . Are orders appropriate based on the patient's diagnosis, regimen, and cycle? Yes . Does the plan date match the patient's scheduled date? Yes . Is the sequencing of drugs appropriate? Yes . Are the premedications appropriate for the patient's regimen? Yes . Prior Authorization for treatment is: Pending o If applicable, is the correct biosimilar selected based on the patient's insurance? not applicable  Organ Function and Labs: Marland Kitchen Are dose adjustments needed based on the patient's renal function, hepatic function, or hematologic function? No . Are appropriate labs ordered prior to the start of patient's treatment? Yes . Other organ system assessment, if indicated: N/A . The following baseline labs, if indicated, have been ordered: N/A  Dose Assessment: . Are the drug doses appropriate? Yes . Are the following correct: o Drug concentrations Yes o IV fluid compatible with drug Yes o Administration routes Yes o Timing of therapy Yes . If applicable, does the patient have documented access for treatment and/or plans for port-a-cath placement? yes . If applicable, have lifetime cumulative doses been properly documented and assessed? not applicable  Toxicity Monitoring/Prevention: . The patient has the following take home antiemetics prescribed: Ondansetron and Prochlorperazine . The patient has the following take home medications prescribed: N/A . Medication allergies and previous infusion related reactions, if applicable, have been reviewed and addressed. Yes . The patient's current medication list has been assessed for drug-drug interactions with their chemotherapy regimen. no significant drug-drug interactions were identified on review.  Order Review: . Are the treatment plan orders signed? Yes . Is the patient scheduled to see a provider prior to their treatment? No   Due  elevated BSA, MD using adjusted dosing so that the calculated total dose of chemotherapy is < 75 mg  I verify that I have reviewed each item in the above checklist and answered each question accordingly.   Kennith Center, Pharm.D., CPP 09/19/2019@2 :41 PM

## 2019-09-19 NOTE — Telephone Encounter (Signed)
Called [name] and advised her of 2:30 IV start appointment today before CT SIM.  She verbalized understanding and agreement.

## 2019-09-19 NOTE — Progress Notes (Signed)
  Radiation Oncology         [phone] ________________________________  Name: Brittney Lamb MRN: [id number]  Date: 09/19/2019  DOB: 1955/10/19  SIMULATION AND TREATMENT PLANNING NOTE    ICD-10-CM   1. 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  Identity was confirmed.  All relevant records and images related to the planned course of therapy were reviewed.  The patient freely provided informed written consent to proceed with treatment after reviewing the details related to the planned course of therapy. The consent form was witnessed and verified by the simulation staff.  Then, the patient was set-up in a stable reproducible supine position for radiation therapy.  CT images were obtained.  Surface markings were placed.  The CT images were loaded into the planning software.  Then the target and avoidance structures were contoured.  Treatment planning then occurred.  The radiation prescription was entered and confirmed.  Then, I designed and supervised the construction of a total of 3 medically necessary complex treatment devices.  I have requested : Intensity Modulated Radiotherapy (IMRT) is medically necessary for this case for the following reason:  Small bowel and bone marrow sparing..  I have ordered:dose calc.  PLAN:  The patient will receive 45 Gy in 25  Fractions directed at the pelvis area.  The patient will be treated with radiosensitizing chemotherapy in addition.  After she has completed her pelvic radiation therapy the patient will then proceed with l brachytherapy directed at the vaginal cuff.  Patient will retrieve 18 Gy in 3 fractions using iridium 192 is the high-dose-rate source.  -----------------------------------  Blair Promise, PhD, MD  This document serves as a record of services personally  performed by Gery Pray, MD. It was created on his behalf by Clerance Lav, a trained medical scribe. The creation of this record is based on the scribe's personal observations and the provider's statements to them. This document has been checked and approved by the attending provider.

## 2019-09-19 NOTE — Telephone Encounter (Signed)
Called the patient with her CT results from yesterday.  It appears that she has bilateral seromas versus lymphoceles measuring up to 7 cm.  She continues to deny any systemic symptoms and after her visit here last week, had improvement of her bowel function without needing to start MiraLAX.  My suspicion that she is infected is very low.  I spoke with Dr. Alvy Bimler who is in agreement that we can monitor at this time without consideration of unilateral or bilateral drainage of these fluid collections.  The patient knows to call with any signs or symptoms concerning for infection.  Jeral Pinch MD Gynecologic Oncology

## 2019-09-19 NOTE — Progress Notes (Signed)
Has armband been applied?  Yes  Does patient have an allergy to IV contrast dye?: no   Has patient ever received premedication for IV contrast dye?: n/a  Does patient take metformin?: No  If patient does take metformin when was the last dose: n/a  Date of lab work: 08/09/2019 BUN: 17 CR: 0.91  EGfr: >60  IV site: Left AC  Has IV site been added to flowsheet?  Yes

## 2019-09-19 NOTE — Progress Notes (Signed)
Called Brittney Lamb with IR and requested that CBC p diff and CMP be drawn tomorrow with port placement per Dr. Alvy Bimler.

## 2019-09-19 NOTE — Progress Notes (Signed)
Removed left AC IV since simulation is complete. Needle intact upon removal. Patient tolerated well. Applied bandaid to old access site. Patient exited clinic ambulatory in no distress.

## 2019-09-20 ENCOUNTER — Other Ambulatory Visit: Payer: Self-pay

## 2019-09-20 ENCOUNTER — Telehealth: Payer: Self-pay | Admitting: Hematology and Oncology

## 2019-09-20 ENCOUNTER — Ambulatory Visit (HOSPITAL_COMMUNITY)
Admission: RE | Admit: 2019-09-20 | Discharge: 2019-09-20 | Disposition: A | Discharge status: Home / Self Care | Payer: 59 | Source: Ambulatory Visit | Attending: Hematology and Oncology | Admitting: Hematology and Oncology

## 2019-09-20 ENCOUNTER — Encounter (HOSPITAL_COMMUNITY): Payer: Self-pay

## 2019-09-20 DIAGNOSIS — Z79899 Other long term (current) drug therapy: Secondary | ICD-10-CM | POA: Diagnosis not present

## 2019-09-20 DIAGNOSIS — I10 Essential (primary) hypertension: Secondary | ICD-10-CM | POA: Diagnosis not present

## 2019-09-20 DIAGNOSIS — K219 Gastro-esophageal reflux disease without esophagitis: Secondary | ICD-10-CM | POA: Insufficient documentation

## 2019-09-20 DIAGNOSIS — Z6841 Body Mass Index (BMI) 40.0 and over, adult: Secondary | ICD-10-CM | POA: Insufficient documentation

## 2019-09-20 DIAGNOSIS — C541 Malignant neoplasm of endometrium: Secondary | ICD-10-CM | POA: Diagnosis not present

## 2019-09-20 HISTORY — PX: IR IMAGING GUIDED PORT INSERTION: IMG5740

## 2019-09-20 LAB — COMPREHENSIVE METABOLIC PANEL
ALT: 16 U/L (ref 0–44)
AST: 19 U/L (ref 15–41)
Albumin: 4 g/dL (ref 3.5–5.0)
Alkaline Phosphatase: 71 U/L (ref 38–126)
Anion gap: 8 (ref 5–15)
BUN: 13 mg/dL (ref 8–23)
CO2: 27 mmol/L (ref 22–32)
Calcium: 9.4 mg/dL (ref 8.9–10.3)
Chloride: 104 mmol/L (ref 98–111)
Creatinine, Ser: 1.06 mg/dL — ABNORMAL HIGH (ref 0.44–1.00)
GFR calc Af Amer: >60 mL/min (ref >60)
GFR calc non Af Amer: 55 mL/min — ABNORMAL LOW (ref >60)
Glucose, Bld: 99 mg/dL (ref 70–99)
Potassium: 3.6 mmol/L (ref 3.5–5.1)
Sodium: 139 mmol/L (ref 135–145)
Total Bilirubin: 0.8 mg/dL (ref 0.3–1.2)
Total Protein: 7.7 g/dL (ref 6.5–8.1)

## 2019-09-20 LAB — CBC WITH DIFFERENTIAL/PLATELET
Abs Immature Granulocytes: 0.03 10*3/uL (ref 0.00–0.07)
Basophils Absolute: 0 10*3/uL (ref 0.0–0.1)
Basophils Relative: 0 %
Eosinophils Absolute: 0.5 10*3/uL (ref 0.0–0.5)
Eosinophils Relative: 6 %
HCT: 39.3 % (ref 36.0–46.0)
Hemoglobin: 12.3 g/dL (ref 12.0–15.0)
Immature Granulocytes: 0 %
Lymphocytes Relative: 24 %
Lymphs Abs: 1.8 10*3/uL (ref 0.7–4.0)
MCH: 28.1 pg (ref 26.0–34.0)
MCHC: 31.3 g/dL (ref 30.0–36.0)
MCV: 89.7 fL (ref 80.0–100.0)
Monocytes Absolute: 0.6 10*3/uL (ref 0.1–1.0)
Monocytes Relative: 8 %
Neutro Abs: 4.4 10*3/uL (ref 1.7–7.7)
Neutrophils Relative %: 62 %
Platelets: 289 10*3/uL (ref 150–400)
RBC: 4.38 MIL/uL (ref 3.87–5.11)
RDW: 13.8 % (ref 11.5–15.5)
WBC: 7.3 10*3/uL (ref 4.0–10.5)
nRBC: 0 % (ref 0.0–0.2)

## 2019-09-20 LAB — PROTIME-INR
INR: 1 (ref 0.8–1.2)
Prothrombin Time: 12.5 seconds (ref 11.4–15.2)

## 2019-09-20 MED ORDER — LIDOCAINE-EPINEPHRINE 1 %-1:100000 IJ SOLN
INTRAMUSCULAR | Status: AC | PRN
  Administered 2019-09-20 (×2): 10 mL via INTRADERMAL

## 2019-09-20 MED ORDER — HEPARIN SOD (PORK) LOCK FLUSH 100 UNIT/ML IV SOLN
INTRAVENOUS | Status: AC
  Filled 2019-09-20: qty 5

## 2019-09-20 MED ORDER — CEFAZOLIN SODIUM-DEXTROSE 2-4 GM/100ML-% IV SOLN: 2.0000 g | INTRAVENOUS | Status: AC

## 2019-09-20 MED ORDER — FENTANYL CITRATE (PF) 100 MCG/2ML IJ SOLN
INTRAMUSCULAR | Status: AC | PRN
  Administered 2019-09-20 (×2): 50 ug via INTRAVENOUS

## 2019-09-20 MED ORDER — LIDOCAINE-EPINEPHRINE 1 %-1:100000 IJ SOLN
INTRAMUSCULAR | Status: AC
  Filled 2019-09-20: qty 1

## 2019-09-20 MED ORDER — LIDOCAINE HCL 1 % IJ SOLN
INTRAMUSCULAR | Status: AC
  Filled 2019-09-20: qty 20

## 2019-09-20 MED ORDER — SODIUM CHLORIDE 0.9 % IV SOLN: INTRAVENOUS | Status: DC

## 2019-09-20 MED ORDER — FENTANYL CITRATE (PF) 100 MCG/2ML IJ SOLN
INTRAMUSCULAR | Status: AC
  Filled 2019-09-20: qty 2

## 2019-09-20 MED ORDER — MIDAZOLAM HCL 2 MG/2ML IJ SOLN
INTRAMUSCULAR | Status: AC
  Filled 2019-09-20: qty 4

## 2019-09-20 MED ORDER — CEFAZOLIN SODIUM-DEXTROSE 2-4 GM/100ML-% IV SOLN
INTRAVENOUS | Status: AC
  Administered 2019-09-20: 2 g via INTRAVENOUS
  Filled 2019-09-20: qty 100

## 2019-09-20 MED ORDER — HEPARIN SOD (PORK) LOCK FLUSH 100 UNIT/ML IV SOLN
INTRAVENOUS | Status: AC | PRN
  Administered 2019-09-20: 500 [IU] via INTRAVENOUS

## 2019-09-20 MED ORDER — MIDAZOLAM HCL 2 MG/2ML IJ SOLN
INTRAMUSCULAR | Status: AC | PRN
  Administered 2019-09-20 (×2): 1 mg via INTRAVENOUS

## 2019-09-20 NOTE — Discharge Instructions (Addendum)

## 2019-09-20 NOTE — Consult Note (Addendum)
Chief Complaint: Patient was seen in consultation today for port a cath placement  Referring Physician(s): [place]  Supervising Physician: Shick,T  Patient Status: [name]  History of Present Illness: Brittney Lamb is a 64 y.o. female with history of endometrial carcinoma diagnosed in February of this year, status post surgery, who presents today for Port-A-Cath placement for chemotherapy.  Past Medical History:  Diagnosis Date  . Arthritis    Spinal - inflammatory  . BMI 50.0-59.9, adult ([place])   . Endometrial cancer ([place])   . Family history of brain cancer   . Family history of cervical cancer   . Family history of colon cancer   . GERD (gastroesophageal reflux disease)   . Hypertension   . Morbid obesity ([place])   . Uterine cancer [facility])     Past Surgical History:  Procedure Laterality Date  . cardiac catherization  2011  . LAPAROSCOPY  1996   scar tissue  . ROBOTIC ASSISTED TOTAL HYSTERECTOMY WITH BILATERAL SALPINGO OOPHERECTOMY Bilateral 08/15/2019   Procedure: XI ROBOTIC ASSISTED TOTAL HYSTERECTOMY WITH BILATERAL SALPINGO OOPHORECTOMY;  Surgeon: Lafonda Mosses, MD;  Location: [facility];  Service: Gynecology;  Laterality: Bilateral;  . SENTINEL NODE BIOPSY N/A 08/15/2019   Procedure: LYMPH NODE DISSECTION,LAPAROTOMY;  Surgeon: Lafonda Mosses, MD;  Location: [facility];  Service: Gynecology;  Laterality: N/A;  . TONSILLECTOMY    . WISDOM TOOTH EXTRACTION      Allergies: Lisinopril and Saxenda [liraglutide -weight management]  Medications: Prior to Admission medications   Medication Sig Start Date End Date Taking? Authorizing Provider  amLODipine (NORVASC) 2.5 MG tablet Take 2.5 mg by mouth daily.    [provider]  hydrochlorothiazide (HYDRODIURIL) 25 MG tablet Take 25 mg by mouth daily.    [provider]  lidocaine-prilocaine (EMLA) cream Apply to affected area once 09/12/19   Heath Lark, MD  meloxicam (MOBIC) 15 MG tablet Take  15 mg by mouth daily.    [provider]  Multiple Vitamin (MULTIVITAMIN) tablet Take 1 tablet by mouth daily.    [provider]  ondansetron (ZOFRAN) 8 MG tablet Take 1 tablet (8 mg total) by mouth 2 (two) times daily as needed. Start on the third day after chemotherapy. 09/12/19   Heath Lark, MD  prochlorperazine (COMPAZINE) 10 MG tablet Take 1 tablet (10 mg total) by mouth every 6 (six) hours as needed (Nausea or vomiting). 09/12/19   Heath Lark, MD  rosuvastatin (CRESTOR) 20 MG tablet Take 20 mg by mouth daily.    [provider]     Family History  Problem Relation Age of Onset  . Heart disease Father   . Thyroid disease Father   . Hypertension Mother   . COPD Mother   . Hypertension Half-Brother   . Thyroid disease Half-Sister   . COPD Half-Sister   . Hypertension Half-Sister   . Neuropathy Half-Sister   . Cervical cancer Paternal Aunt        dx. in her late 47s  . Brain cancer Paternal Uncle 72  . Cervical cancer Paternal Grandmother        dx. in her 65s  . Uterine cancer Neg Hx   . Breast cancer Neg Hx     Social History   Socioeconomic History  . Marital status: Married    Spouse name: Not on file  . Number of children: Not on file  . Years of education: Not on file  . Highest education level: Not on  file  Occupational History  . Not on file  Tobacco Use  . Smoking status: Never Smoker  . Smokeless tobacco: Never Used  Substance and Sexual Activity  . Alcohol use: Not Currently    Comment: rare glass of wine  . Drug use: Never  . Sexual activity: Not Currently  Other Topics Concern  . Not on file  Social History Narrative  . Not on file   Social Determinants of Health   Financial Resource Strain:   . Difficulty of Paying Living Expenses:   Food Insecurity:   . Worried About Charity fundraiser in the Last Year:   . Arboriculturist in the Last Year:   Transportation Needs:   . Film/video editor (Medical):   Marland Kitchen Lack of  Transportation (Non-Medical):   Physical Activity:   . Days of Exercise per Week:   . Minutes of Exercise per Session:   Stress:   . Feeling of Stress :   Social Connections:   . Frequency of Communication with Friends and Family:   . Frequency of Social Gatherings with Friends and Family:   . Attends Religious Services:   . Active Member of Clubs or Organizations:   . Attends Archivist Meetings:   Marland Kitchen Marital Status:       Review of Systems currently denies fever, headache, chest pain, dyspnea, cough, back pain, nausea, vomiting or bleeding.  She does have some mild pelvic discomfort  Vital Signs: BP (!) 176/90 (BP Location: Right Arm)   Pulse 70   Temp 98.2 F (36.8 C) (Oral)   Resp 18   SpO2 100%   Physical Exam awake, alert.  Chest clear to auscultation bilaterally.  Heart with regular rate and rhythm.  Abdomen obese, soft, positive bowel sounds, mildly tender suprapubic/pelvic region to palpation.  Some lower extremity edema noted bilat  Imaging: CT PELVIS W CONTRAST  Result Date: 09/18/2019 CLINICAL DATA:  64 year old female with rectal pain and pressure. Rule out hematoma or seroma. History of endometrial cancer status post recent hysterectomy and bilateral salpingo-oophorectomy. EXAM: CT PELVIS WITH CONTRAST TECHNIQUE: Multidetector CT imaging of the pelvis was performed using the standard protocol following the bolus administration of intravenous contrast. CONTRAST:  153mL OMNIPAQUE IOHEXOL 300 MG/ML  SOLN COMPARISON:  CT abdomen pelvis dated 07/21/2019. FINDINGS: Urinary Tract: Partially visualized 3 cm fluid attenuating structure from the inferior pole of the left kidney. The visualized ureters appear unremarkable. The urinary bladder is mildly distended. Mild thickened and hazy appearance of the bladder wall, likely reactive to inflammatory changes of the pelvis. Bowel: No bowel dilatation or active inflammation in the visualized pelvis. The appendix is  unremarkable. Vascular/Lymphatic: Visualized aorta and iliac arteries appear unremarkable. No adenopathy. Reproductive: Status post recent hysterectomy and bilateral salpingo-oophorectomy. There are loculated fluid collections along the lateral pelvic sidewall in the expected region of the ovaries. These collections measure approximately 7.1 x 3.5 cm on the right and 6.1 x 2.5 cm on the left. These may represent postoperative seroma but concerning for lymphocele. Superimposed infection or abscess are not excluded. Clinical correlation is recommended. Other:  There is a small free fluid within the pelvis. Musculoskeletal: Degenerative changes of the visualized lower lumbar spine and hips. No acute osseous pathology. IMPRESSION: Status post recent hysterectomy and bilateral salpingo-oophorectomy. Loculated fluid collections along the lateral pelvic sidewall may represent postoperative seroma or lymphocele. Superimposed infection or abscess are not excluded. Clinical correlation is recommended. Electronically Signed   By: Milas Hock  Radparvar M.D.   On: 09/18/2019 20:05    Labs:  CBC: Recent Labs    07/20/19 1506 08/09/19 1423  WBC 10.8* 10.3  HGB 12.5 12.2  HCT 39.9 39.2  PLT 327 295    COAGS: No results for input(s): INR, APTT in the last 8760 hours.  BMP: Recent Labs    07/14/19 1252 08/09/19 1423  NA 142 139  K 3.5 3.2*  CL 103 102  CO2 31 29  GLUCOSE 94 127*  BUN 14 17  CALCIUM 9.6 9.2  CREATININE 0.97 0.91  GFRNONAA >60 >60  GFRAA >60 >60    LIVER FUNCTION TESTS: Recent Labs    07/14/19 1252  BILITOT 0.5  AST 18  ALT 14  ALKPHOS 81  PROT 7.6  ALBUMIN 3.8    TUMOR MARKERS: No results for input(s): AFPTM, CEA, CA199, CHROMGRNA in the last 8760 hours.  Assessment and Plan: 64 y.o. female with history of endometrial carcinoma diagnosed in February of this year, status post surgery, who presents today for Port-A-Cath placement for chemotherapy.Risks and benefits of image  guided port-a-catheter placement was discussed with the patient including, but not limited to bleeding, infection, pneumothorax, or fibrin sheath development and need for additional procedures.  All of the patient's questions were answered, patient is agreeable to proceed. Consent signed and in chart.     Thank you for this interesting consult.  I greatly enjoyed meeting Brittney L Robards and look forward to participating in their care.  A copy of this report was sent to the requesting provider on this date.  Electronically Signed: D. Rowe Robert, PA-C 09/20/2019, 11:22 AM   I spent a total of 25 minutes  in face to face in clinical consultation, greater than 50% of which was counseling/coordinating care for Port-A-Cath placement

## 2019-09-20 NOTE — Procedures (Signed)
Interventional Radiology Procedure Note  Procedure: RT IJ POWER PORT  Complications: None  Estimated Blood Loss: MIN  Findings: TIP SVCRA       

## 2019-09-20 NOTE — Telephone Encounter (Signed)
Unable to reach pt. Left voicemail. Scheduled appt on 5/21. Per 5/5 sch msg.

## 2019-09-21 DIAGNOSIS — Z510 Encounter for antineoplastic radiation therapy: Secondary | ICD-10-CM | POA: Diagnosis not present

## 2019-09-22 ENCOUNTER — Encounter: Payer: Self-pay | Admitting: Genetic Counselor

## 2019-09-22 ENCOUNTER — Telehealth: Payer: Self-pay | Admitting: Genetic Counselor

## 2019-09-22 ENCOUNTER — Telehealth: Payer: Self-pay | Admitting: Oncology

## 2019-09-22 ENCOUNTER — Ambulatory Visit: Payer: Self-pay | Admitting: Genetic Counselor

## 2019-09-22 DIAGNOSIS — Z1379 Encounter for other screening for genetic and chromosomal anomalies: Secondary | ICD-10-CM

## 2019-09-22 NOTE — Telephone Encounter (Signed)
Revealed negative genetic testing. Discussed that we do not know why she has endometrial cancer or why there is cancer in the family. It is possible that there could be a mutation in a different gene that we are not testing, or our current technology may not be able detect certain mutations. Therefore, it will be a good idea for her to stay in contact with genetics to keep up with whether additional testing may be appropriate in the future.

## 2019-09-22 NOTE — Telephone Encounter (Signed)
Brittney Lamb called and had questions about her port (if it is ok to lean her head back to have her hair done) and if it is ok to have a pedicure before chemo starts on Monday.  Advised her she can get her hair done and that it is ok to have a pedicure.

## 2019-09-22 NOTE — Progress Notes (Signed)
HPI:  Ms. Provencal was previously seen in the [facility] clinic due to a personal and family history of cancer and concerns regarding a hereditary predisposition to cancer. Please refer to our prior cancer genetics clinic note for more information regarding our discussion, assessment and recommendations, at the time. Ms. Klostermann recent genetic test results were disclosed to her, as were recommendations warranted by these results. These results and recommendations are discussed in more detail below.  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 Possible 1.9 cm solid echogenic endometrial mass. In the setting of post-menopausal bleeding, endometrial sampling is indicated to exclude carcinoma. I 2. Enlarged uterus with at least 1 calcified fibroid at the fundus measuring 2.6 cm. 3. Heterogenous fluid within the endometrial canal, can be seen in the setting of cervical obstruction. 4. Nonvisualized ovaries   06/30/2019 Initial Biopsy   EMB: high grade adenocarcinoma   07/14/2019 Tumor Marker   CA-125: 354   07/21/2019 Imaging   CT A/P: IMPRESSION: 1. No gross extension of uterine carcinoma beyond the myometrium. 2. No pelvic lymphadenopathy. 3. No retroperitoneal periaortic adenopathy. 4. No visceral metastasis or skeletal metastasis. 5. Benign adenoma of the LEFT adrenal gland.  Benign renal cysts.   08/15/2019 Surgery   Robotic-assisted laparoscopic total hysterectomy with bilateral salpingoophorectomy, SLN injection, bilateral pelvic LND, mini-lap for specimen removal Modifier 22: significant obesity causing difficulty visualizing anatomy, increasing OR time    08/15/2019 Pathology Results   A. 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  - The surgical resection margins are negative for carcinoma.  - See oncology table below.   Myometrium: Leiomyomata.  Serosa: Unremarkable.  Bilateral adnexa: Benign ovaries and fallopian tubes with endometriosis.   B. LYMPH NODES, RIGHT PELVIC, RESECTION:  - There is no evidence of carcinoma in 6 of 6 lymph nodes (0/6).   C. LYMPH NODES, LEFT PELVIC, RESECTION:  - There is no evidence of carcinoma in 7 of 7 lymph nodes (0/7).    ONCOLOGY TABLE:   UTERUS, CARCINOMA OR CARCINOSARCOMA   Procedure: Total hysterectomy and bilateral salpingo-oophorectomy  Histologic type: Clear cell adenocarcinoma  Histologic Grade: High-grade  Myometrial invasion:       Depth of invasion: 19.5 mm       Myometrial thickness: 20 mm  Uterine Serosa Involvement: Not definitively identified  Cervical stromal involvement: Present  Extent of involvement of other organs: Confined to lower uterine segment  Lymphovascular invasion: Not identified  Regional Lymph Nodes:       Examined:        0 Sentinel                               13 non-sentinel                               13 total        Lymph nodes with metastasis: 0  Representative Tumor Block: A17  MMR / MSI testing: Can be ordered upon clinician request.  Pathologic Stage Classification (pTNM, AJCC  8th edition):  pT2, pN0  (FIGO stage II)  Comments: Dr. Claudette Laws has reviewed selected slides and concurs with  the phenotype of this tumor.  Additional studies can be performed upon  clinician request.    08/15/2019 Cancer Staging   Staging form: Corpus Uteri - Carcinoma and Carcinosarcoma, AJCC 8th Edition - Clinical stage from 08/15/2019: FIGO Stage II (cT2, cN0, cM0) - Signed by Lafonda Mosses, MD on 08/23/2019   09/18/2019 Imaging   Status post recent hysterectomy and  bilateral salpingo-oophorectomy. Loculated fluid collections along the lateral pelvic sidewall may represent postoperative seroma or lymphocele. Superimposed infection or abscess are not excluded. Clinical correlation is recommended.   09/18/2019 Genetic Testing   PNegative genetic testing:  No pathogenic variants detected on the [facility] Common Hereditary Cancers Panel. The report date is 09/18/2019.  The Common Hereditary Cancers Panel offered by [facility] includes sequencing and/or deletion duplication testing of the following 48 genes: APC, ATM, AXIN2, BARD1, BMPR1A, BRCA1, BRCA2, BRIP1, CDH1, CDK4, CDKN2A (p14ARF), CDKN2A (p16INK4a), CHEK2, CTNNA1, DICER1, EPCAM (Deletion/duplication testing only), GREM1 (promoter region deletion/duplication testing only), KIT, MEN1, MLH1, MSH2, MSH3, MSH6, MUTYH, NBN, NF1, NHTL1, PALB2, PDGFRA, PMS2, POLD1, POLE, PTEN, RAD50, RAD51C, RAD51D, RNF43, SDHB, SDHC, SDHD, SMAD4, SMARCA4. STK11, TP53, TSC1, TSC2, and VHL.  The following genes were evaluated for sequence changes only: SDHA and HOXB13 c.251G>A variant only.    09/20/2019 Procedure   Ultrasound and fluoroscopically guided right internal jugular single lumen power port catheter insertion. Tip in the SVC/RA junction. Catheter ready for use.     FAMILY HISTORY:  We obtained a detailed, 4-generation family history.  Significant diagnoses are listed below: Family History  Problem Relation Age of Onset  . Heart disease Father   . Thyroid disease Father   . Hypertension Mother   . COPD Mother   . Hypertension Half-Brother   . Thyroid disease Half-Sister   . COPD Half-Sister   . Hypertension Half-Sister   . Neuropathy Half-Sister   . Cervical cancer Paternal Aunt        dx. in her late 68s  . Brain cancer Paternal Uncle 19  . Cervical cancer Paternal Grandmother        dx. in her 16s  . Uterine cancer Neg Hx   . Breast cancer Neg Hx    Ms. Barraclough has one adopted daughter. She has one maternal  half-sister (age 18), a paternal half-brother (age 33) and a paternal half-sister (age 74). None of her siblings have had cancer.   Ms. Bazzle mother died at the age of 63 and did not have cancer. Ms. Yono did not have any maternal aunts or uncles. Her maternal grandmother died at the age of 38 and had a history of colon cancer diagnosed when she was older than 79. Her maternal grandfather died when he was younger than 17. There are no other known diagnoses of cancer on the maternal side of the family.  Ms. Bartolotta father is 34 and has not had cancer. She has five paternal aunts and two paternal uncles. One uncle died at the age of 73 from a brain tumor. One aunt died in her late 85s from cervical cancer. There are no known cancer diagnoses among any paternal first cousins. Her paternal grandmother died in her 45s and had cervical cancer, and her paternal grandfather died in his late 42s or early 62s. There are no other known diagnoses of cancer on the paternal side of the family.  Ms. Tetrault is  unaware of previous family history of genetic testing for hereditary cancer risks. Her ancestors are mostly of Black/African American descent. There is no reported Ashkenazi Jewish ancestry. There is no known consanguinity.  GENETIC TEST RESULTS: Genetic testing reported out on 09/18/2019 through the [facility] Common Hereditary Cancers Panel. No pathogenic variants were detected.   The Common Hereditary Cancers Panel offered by [facility] includes sequencing and/or deletion duplication testing of the following 48 genes: APC, ATM, AXIN2, BARD1, BMPR1A, BRCA1, BRCA2, BRIP1, CDH1, CDK4, CDKN2A (p14ARF), CDKN2A (p16INK4a), CHEK2, CTNNA1, DICER1, EPCAM (Deletion/duplication testing only), GREM1 (promoter region deletion/duplication testing only), KIT, MEN1, MLH1, MSH2, MSH3, MSH6, MUTYH, NBN, NF1, NHTL1, PALB2, PDGFRA, PMS2, POLD1, POLE, PTEN, RAD50, RAD51C, RAD51D, RNF43, SDHB, SDHC, SDHD, SMAD4, SMARCA4. STK11, TP53,  TSC1, TSC2, and VHL.  The following genes were evaluated for sequence changes only: SDHA and HOXB13 c.251G>A variant only. The test report will be scanned into EPIC and located under the Molecular Pathology section of the Results Review tab.  A portion of the result report is included below for reference.     We discussed with Ms. Cecchi that because current genetic testing is not perfect, it is possible there may be a gene mutation in one of these genes that current testing cannot detect, but that chance is small.  We also discussed, that there could be another gene that has not yet been discovered, or that we have not yet tested, that is responsible for the cancer diagnoses in the family. Therefore, it is important to remain in touch with cancer genetics in the future so that we can continue to offer Ms. Hathorne the most up to date genetic testing.   CANCER SCREENING RECOMMENDATIONS: Ms. Reames test result is considered negative (normal).  This means that we have not identified a hereditary cause for her personal and family history of cancer at this time. Most cancers happen by chance and this negative test suggests that her personal and family of cancer may fall into this category.    While reassuring, this does not definitively rule out a hereditary predisposition to cancer. It is still possible that there could be genetic mutations that are undetectable by current technology. There could be genetic mutations in genes that have not been tested or identified to increase cancer risk.  Therefore, it is recommended she continue to follow the cancer management and screening guidelines provided by her oncology and primary healthcare providers.   An individual's cancer risk and medical management are not determined by genetic test results alone. Overall cancer risk assessment incorporates additional factors, including personal medical history, family history, and any available genetic information that may  result in a personalized plan for cancer prevention and surveillance.  RECOMMENDATIONS FOR FAMILY MEMBERS:  Individuals in this family might be at some increased risk of developing cancer, over the general population risk, simply due to the family history of cancer.  We recommended women in this family have a yearly mammogram beginning at age 88, or 82 years younger than the earliest onset of cancer, an annual clinical breast exam, and perform monthly breast self-exams. Women in this family should also have a gynecological exam as recommended by their primary provider. All family members should have a colonoscopy by age 8.  FOLLOW-UP: Lastly, we discussed with Ms. Vandyke that cancer genetics is a rapidly advancing field and it is possible that new genetic tests will be appropriate for her and/or her family members in the future. We encouraged her to remain in contact  with cancer genetics on an annual basis so we can update her personal and family histories and let her know of advances in cancer genetics that may benefit this family.   Our contact number was provided. Ms. Bartling questions were answered to her satisfaction, and she knows she is welcome to call us at anytime with additional questions or concerns.   Clint Guy, MS, [facility] Genetic Counselor [email or web address] Phone: [phone]

## 2019-09-25 ENCOUNTER — Encounter: Payer: Self-pay | Admitting: Hematology and Oncology

## 2019-09-25 ENCOUNTER — Inpatient Hospital Stay: Payer: 59 | Attending: Hematology and Oncology

## 2019-09-25 ENCOUNTER — Other Ambulatory Visit: Payer: Self-pay

## 2019-09-25 ENCOUNTER — Ambulatory Visit: Payer: 59 | Admitting: Radiation Oncology

## 2019-09-25 VITALS — BP 131/69 | HR 82 | Temp 98.5°F | Resp 18 | Wt 270.5 lb

## 2019-09-25 DIAGNOSIS — Z90722 Acquired absence of ovaries, bilateral: Secondary | ICD-10-CM | POA: Insufficient documentation

## 2019-09-25 DIAGNOSIS — C541 Malignant neoplasm of endometrium: Secondary | ICD-10-CM | POA: Diagnosis present

## 2019-09-25 DIAGNOSIS — Z90710 Acquired absence of both cervix and uterus: Secondary | ICD-10-CM | POA: Diagnosis not present

## 2019-09-25 DIAGNOSIS — Z79899 Other long term (current) drug therapy: Secondary | ICD-10-CM | POA: Insufficient documentation

## 2019-09-25 DIAGNOSIS — Z5111 Encounter for antineoplastic chemotherapy: Secondary | ICD-10-CM | POA: Diagnosis not present

## 2019-09-25 DIAGNOSIS — T451X5A Adverse effect of antineoplastic and immunosuppressive drugs, initial encounter: Secondary | ICD-10-CM | POA: Diagnosis not present

## 2019-09-25 DIAGNOSIS — Z9079 Acquired absence of other genital organ(s): Secondary | ICD-10-CM | POA: Diagnosis not present

## 2019-09-25 DIAGNOSIS — G620 Drug-induced polyneuropathy: Secondary | ICD-10-CM | POA: Diagnosis not present

## 2019-09-25 MED ORDER — HEPARIN SOD (PORK) LOCK FLUSH 100 UNIT/ML IV SOLN
500.0000 [IU] | Freq: Once | INTRAVENOUS | Status: AC | PRN
  Administered 2019-09-25: 500 [IU]
  Filled 2019-09-25: qty 5

## 2019-09-25 MED ORDER — SODIUM CHLORIDE 0.9 % IV SOLN
40.0000 mg/m2 | Freq: Once | INTRAVENOUS | Status: AC
  Administered 2019-09-25: 73 mg via INTRAVENOUS
  Filled 2019-09-25: qty 73

## 2019-09-25 MED ORDER — SODIUM CHLORIDE 0.9 % IV SOLN
150.0000 mg | Freq: Once | INTRAVENOUS | Status: AC
  Administered 2019-09-25: 150 mg via INTRAVENOUS
  Filled 2019-09-25: qty 150

## 2019-09-25 MED ORDER — PALONOSETRON HCL INJECTION 0.25 MG/5ML
0.2500 mg | Freq: Once | INTRAVENOUS | Status: AC
  Administered 2019-09-25: 0.25 mg via INTRAVENOUS

## 2019-09-25 MED ORDER — POTASSIUM CHLORIDE 2 MEQ/ML IV SOLN
Freq: Once | INTRAVENOUS | Status: AC
  Filled 2019-09-25: qty 10

## 2019-09-25 MED ORDER — PALONOSETRON HCL INJECTION 0.25 MG/5ML
INTRAVENOUS | Status: AC
  Filled 2019-09-25: qty 5

## 2019-09-25 MED ORDER — SODIUM CHLORIDE 0.9% FLUSH
10.0000 mL | INTRAVENOUS | Status: DC | PRN
  Administered 2019-09-25: 10 mL
  Filled 2019-09-25: qty 10

## 2019-09-25 MED ORDER — SODIUM CHLORIDE 0.9 % IV SOLN
10.0000 mg | Freq: Once | INTRAVENOUS | Status: AC
  Administered 2019-09-25: 10 mg via INTRAVENOUS
  Filled 2019-09-25: qty 10

## 2019-09-25 MED ORDER — SODIUM CHLORIDE 0.9 % IV SOLN
Freq: Once | INTRAVENOUS | Status: AC
  Filled 2019-09-25: qty 250

## 2019-09-25 NOTE — Patient Instructions (Addendum)
[facility] Discharge Instructions for Patients Receiving Chemotherapy  Today you received the following chemotherapy agents Cisplatin (PLATINOL).  To help prevent nausea and vomiting after your treatment, we encourage you to take your nausea medication as prescribed.   If you develop nausea and vomiting that is not controlled by your nausea medication, call the clinic.   BELOW ARE SYMPTOMS THAT SHOULD BE REPORTED IMMEDIATELY:  *FEVER GREATER THAN 100.5 F  *CHILLS WITH OR WITHOUT FEVER  NAUSEA AND VOMITING THAT IS NOT CONTROLLED WITH YOUR NAUSEA MEDICATION  *UNUSUAL SHORTNESS OF BREATH  *UNUSUAL BRUISING OR BLEEDING  TENDERNESS IN MOUTH AND THROAT WITH OR WITHOUT PRESENCE OF ULCERS  *URINARY PROBLEMS  *BOWEL PROBLEMS  UNUSUAL RASH Items with * indicate a potential emergency and should be followed up as soon as possible.  Feel free to call the clinic should you have any questions or concerns. The clinic phone number is [phone].  Please show the [facility] at check-in to the Emergency Department and triage nurse.  Cisplatin injection What is this medicine? CISPLATIN (SIS pla tin) is a chemotherapy drug. It targets fast dividing cells, like cancer cells, and causes these cells to die. This medicine is used to treat many types of cancer like bladder, ovarian, and testicular cancers. This medicine may be used for other purposes; ask your health care provider or pharmacist if you have questions. COMMON BRAND NAME(S): Platinol, Platinol -AQ What should I tell my health care provider before I take this medicine? They need to know if you have any of these conditions:  eye disease, vision problems  hearing problems  kidney disease  low blood counts, like white cells, platelets, or red blood cells  tingling of the fingers or toes, or other nerve disorder  an unusual or allergic reaction to cisplatin, carboplatin, oxaliplatin, other medicines, foods,  dyes, or preservatives  pregnant or trying to get pregnant  breast-feeding How should I use this medicine? This drug is given as an infusion into a vein. It is administered in a hospital or clinic by a specially trained health care professional. Talk to your pediatrician regarding the use of this medicine in children. Special care may be needed. Overdosage: If you think you have taken too much of this medicine contact a poison control center or emergency room at once. NOTE: This medicine is only for you. Do not share this medicine with others. What if I miss a dose? It is important not to miss a dose. Call your doctor or health care professional if you are unable to keep an appointment. What may interact with this medicine? This medicine may interact with the following medications:  foscarnet  certain antibiotics like amikacin, gentamicin, neomycin, polymyxin B, streptomycin, tobramycin, vancomycin This list may not describe all possible interactions. Give your health care provider a list of all the medicines, herbs, non-prescription drugs, or dietary supplements you use. Also tell them if you smoke, drink alcohol, or use illegal drugs. Some items may interact with your medicine. What should I watch for while using this medicine? Your condition will be monitored carefully while you are receiving this medicine. You will need important blood work done while you are taking this medicine. This drug may make you feel generally unwell. This is not uncommon, as chemotherapy can affect healthy cells as well as cancer cells. Report any side effects. Continue your course of treatment even though you feel ill unless your doctor tells you to stop. This medicine may increase  your risk of getting an infection. Call your healthcare professional for advice if you get a fever, chills, or sore throat, or other symptoms of a cold or flu. Do not treat yourself. Try to avoid being around people who are sick. Avoid  taking medicines that contain aspirin, acetaminophen, ibuprofen, naproxen, or ketoprofen unless instructed by your healthcare professional. These medicines may hide a fever. This medicine may increase your risk to bruise or bleed. Call your doctor or health care professional if you notice any unusual bleeding. Be careful brushing and flossing your teeth or using a toothpick because you may get an infection or bleed more easily. If you have any dental work done, tell your dentist you are receiving this medicine. Do not become pregnant while taking this medicine or for 14 months after stopping it. Women should inform their healthcare professional if they wish to become pregnant or think they might be pregnant. Men should not father a child while taking this medicine and for 11 months after stopping it. There is potential for serious side effects to an unborn child. Talk to your healthcare professional for more information. Do not breast-feed an infant while taking this medicine. This medicine has caused ovarian failure in some women. This medicine may make it more difficult to get pregnant. Talk to your healthcare professional if you are concerned about your fertility. This medicine has caused decreased sperm counts in some men. This may make it more difficult to father a child. Talk to your healthcare professional if you are concerned about your fertility. Drink fluids as directed while you are taking this medicine. This will help protect your kidneys. Call your doctor or health care professional if you get diarrhea. Do not treat yourself. What side effects may I notice from receiving this medicine? Side effects that you should report to your doctor or health care professional as soon as possible:  allergic reactions like skin rash, itching or hives, swelling of the face, lips, or tongue  blurred vision  changes in vision  decreased hearing or ringing of the ears  nausea, vomiting  pain,  redness, or irritation at site where injected  pain, tingling, numbness in the hands or feet  signs and symptoms of bleeding such as bloody or black, tarry stools; red or dark brown urine; spitting up blood or brown material that looks like coffee grounds; red spots on the skin; unusual bruising or bleeding from the eyes, gums, or nose  signs and symptoms of infection like fever; chills; cough; sore throat; pain or trouble passing urine  signs and symptoms of kidney injury like trouble passing urine or change in the amount of urine  signs and symptoms of low red blood cells or anemia such as unusually weak or tired; feeling faint or lightheaded; falls; breathing problems Side effects that usually do not require medical attention (report to your doctor or health care professional if they continue or are bothersome):  loss of appetite  mouth sores  muscle cramps This list may not describe all possible side effects. Call your doctor for medical advice about side effects. You may report side effects to FDA at 1-800-FDA-1088. Where should I keep my medicine? This drug is given in a hospital or clinic and will not be stored at home. NOTE: This sheet is a summary. It may not cover all possible information. If you have questions about this medicine, talk to your doctor, pharmacist, or health care provider.  2020 [employer]vier/[employer] Standard (2018-04-29 15:59:17)

## 2019-09-25 NOTE — Progress Notes (Signed)
  Radiation Oncology         [phone] ________________________________  Name: Brittney Lamb MRN: [id number]  Date: 09/26/2019  DOB: 24-Apr-1956  Simulation Verification Note    ICD-10-CM   1. Endometrial cancer ([name])  C54.1     Status: outpatient   NARRATIVE: The patient was brought to the treatment unit and placed in the planned treatment position. The clinical setup was verified. Then port films were obtained and uploaded to the radiation oncology medical record software. The treatment beams were carefully compared against the planned radiation fields. The position location and shape of the radiation fields was reviewed. They targeted volume of tissue appears to be appropriately covered by the radiation beams. Organs at risk appear to be excluded as planned.  Based on my personal review, I approved the simulation verification. The patient's treatment will proceed as planned.   -----------------------------------  Blair Promise, PhD, MD  This document serves as a record of services personally performed by Gery Pray, MD. It was created on his behalf by Clerance Lav, a trained medical scribe. The creation of this record is based on the scribe's personal observations and the provider's statements to them. This document has been checked and approved by the attending provider.

## 2019-09-25 NOTE — Progress Notes (Signed)
Met with patient in infusion to introduce myself as Financial Resource Specialist and to offer available resources.  Discussed one-time $1000 [employer] grant and qualifications to assist with personal expenses while going through treatment.  Gave her my card if interested in applying and for any additional financial questions or concerns. 

## 2019-09-26 ENCOUNTER — Ambulatory Visit
Admission: RE | Admit: 2019-09-26 | Discharge: 2019-09-26 | Disposition: A | Discharge status: Home / Self Care | Payer: 59 | Source: Ambulatory Visit | Attending: Radiation Oncology | Admitting: Radiation Oncology

## 2019-09-26 ENCOUNTER — Telehealth: Payer: Self-pay | Admitting: *Deleted

## 2019-09-26 ENCOUNTER — Other Ambulatory Visit: Payer: Self-pay

## 2019-09-26 DIAGNOSIS — Z510 Encounter for antineoplastic radiation therapy: Secondary | ICD-10-CM | POA: Diagnosis not present

## 2019-09-26 DIAGNOSIS — C541 Malignant neoplasm of endometrium: Secondary | ICD-10-CM

## 2019-09-26 NOTE — Telephone Encounter (Addendum)
Called pt to see how she was doing post CDDP treatment.  LVMTCB.   Pt returned call & left message to call her back @ 3:26pm.   Returned call & pt states that she is doing well & has been doing some retail therapy.  She denies any problems but states she had some nausea this am that passed & she didn't feel need to take anything.  Discussed nausea meds & reinforced to take if she is nauseated.  She had questions about what she could eat & we discussed no buffet or eating out in restaurant since she will have to take her mask off.  Informed take out ok. Encouraged to call with any concerns.

## 2019-09-26 NOTE — Telephone Encounter (Signed)
-----   Message from Georgianne Fick, RN sent at 09/25/2019  4:34 PM EDT ----- Regarding: Dr. Calton Dach First Time Chemo Patient received first time Cisplatin today and tolerated this well.

## 2019-09-27 ENCOUNTER — Ambulatory Visit
Admission: RE | Admit: 2019-09-27 | Discharge: 2019-09-27 | Disposition: A | Discharge status: Home / Self Care | Payer: 59 | Source: Ambulatory Visit | Attending: Radiation Oncology | Admitting: Radiation Oncology

## 2019-09-27 ENCOUNTER — Other Ambulatory Visit: Payer: Self-pay

## 2019-09-27 DIAGNOSIS — Z510 Encounter for antineoplastic radiation therapy: Secondary | ICD-10-CM | POA: Diagnosis not present

## 2019-09-28 ENCOUNTER — Other Ambulatory Visit: Payer: Self-pay

## 2019-09-28 ENCOUNTER — Ambulatory Visit
Admission: RE | Admit: 2019-09-28 | Discharge: 2019-09-28 | Disposition: A | Discharge status: Home / Self Care | Payer: 59 | Source: Ambulatory Visit | Attending: Radiation Oncology | Admitting: Radiation Oncology

## 2019-09-28 DIAGNOSIS — Z510 Encounter for antineoplastic radiation therapy: Secondary | ICD-10-CM | POA: Diagnosis not present

## 2019-09-29 ENCOUNTER — Other Ambulatory Visit: Payer: Self-pay

## 2019-09-29 ENCOUNTER — Ambulatory Visit
Admission: RE | Admit: 2019-09-29 | Discharge: 2019-09-29 | Disposition: A | Discharge status: Home / Self Care | Payer: 59 | Source: Ambulatory Visit | Attending: Radiation Oncology | Admitting: Radiation Oncology

## 2019-09-29 DIAGNOSIS — Z510 Encounter for antineoplastic radiation therapy: Secondary | ICD-10-CM | POA: Diagnosis not present

## 2019-10-02 ENCOUNTER — Other Ambulatory Visit: Payer: Self-pay

## 2019-10-02 ENCOUNTER — Ambulatory Visit
Admission: RE | Admit: 2019-10-02 | Discharge: 2019-10-02 | Disposition: A | Discharge status: Home / Self Care | Payer: 59 | Source: Ambulatory Visit | Attending: Radiation Oncology | Admitting: Radiation Oncology

## 2019-10-02 DIAGNOSIS — Z510 Encounter for antineoplastic radiation therapy: Secondary | ICD-10-CM | POA: Diagnosis not present

## 2019-10-03 ENCOUNTER — Ambulatory Visit
Admission: RE | Admit: 2019-10-03 | Discharge: 2019-10-03 | Disposition: A | Discharge status: Home / Self Care | Payer: 59 | Source: Ambulatory Visit | Attending: Radiation Oncology | Admitting: Radiation Oncology

## 2019-10-03 DIAGNOSIS — Z510 Encounter for antineoplastic radiation therapy: Secondary | ICD-10-CM | POA: Diagnosis not present

## 2019-10-04 ENCOUNTER — Ambulatory Visit
Admission: RE | Admit: 2019-10-04 | Discharge: 2019-10-04 | Disposition: A | Discharge status: Home / Self Care | Payer: 59 | Source: Ambulatory Visit | Attending: Radiation Oncology | Admitting: Radiation Oncology

## 2019-10-04 ENCOUNTER — Other Ambulatory Visit: Payer: Self-pay

## 2019-10-04 DIAGNOSIS — Z510 Encounter for antineoplastic radiation therapy: Secondary | ICD-10-CM | POA: Diagnosis not present

## 2019-10-05 ENCOUNTER — Ambulatory Visit
Admission: RE | Admit: 2019-10-05 | Discharge: 2019-10-05 | Disposition: A | Discharge status: Home / Self Care | Payer: 59 | Source: Ambulatory Visit | Attending: Radiation Oncology | Admitting: Radiation Oncology

## 2019-10-05 ENCOUNTER — Other Ambulatory Visit: Payer: Self-pay

## 2019-10-05 DIAGNOSIS — Z510 Encounter for antineoplastic radiation therapy: Secondary | ICD-10-CM | POA: Diagnosis not present

## 2019-10-06 ENCOUNTER — Encounter: Payer: Self-pay | Admitting: Hematology and Oncology

## 2019-10-06 ENCOUNTER — Ambulatory Visit
Admission: RE | Admit: 2019-10-06 | Discharge: 2019-10-06 | Disposition: A | Discharge status: Home / Self Care | Payer: 59 | Source: Ambulatory Visit | Attending: Radiation Oncology | Admitting: Radiation Oncology

## 2019-10-06 ENCOUNTER — Inpatient Hospital Stay (HOSPITAL_BASED_OUTPATIENT_CLINIC_OR_DEPARTMENT_OTHER): Payer: 59 | Admitting: Hematology and Oncology

## 2019-10-06 ENCOUNTER — Other Ambulatory Visit: Payer: Self-pay

## 2019-10-06 DIAGNOSIS — T451X5A Adverse effect of antineoplastic and immunosuppressive drugs, initial encounter: Secondary | ICD-10-CM | POA: Diagnosis not present

## 2019-10-06 DIAGNOSIS — C541 Malignant neoplasm of endometrium: Secondary | ICD-10-CM

## 2019-10-06 DIAGNOSIS — G620 Drug-induced polyneuropathy: Secondary | ICD-10-CM

## 2019-10-06 DIAGNOSIS — R110 Nausea: Secondary | ICD-10-CM | POA: Diagnosis not present

## 2019-10-06 DIAGNOSIS — Z510 Encounter for antineoplastic radiation therapy: Secondary | ICD-10-CM | POA: Diagnosis not present

## 2019-10-06 NOTE — Assessment & Plan Note (Signed)
She will continue antiemetics as needed

## 2019-10-06 NOTE — Assessment & Plan Note (Signed)
she has mild peripheral neuropathy, likely related to side effects of treatment. It is only mild, not bothering the patient. I will observe for now If it gets worse in the future, I will consider modifying the dose of the treatment  

## 2019-10-06 NOTE — Progress Notes (Signed)
[facility] OFF[facility] PROGRESS NOTE  Patient Care Team: [name], MD as PCP - General (Family Medicine) Awanda Mink Craige Cotta, RN as Oncology Nurse Navigator (Oncology)  ASSESSMENT & PLAN:  Endometrial cancer [facility]) Overall, she tolerated treatment fairly well except for some very mild nausea and very minor neuropathy affecting her left foot She will continue radiation as scheduled She is due for chemotherapy again in about 2-1/2 weeks She will get her labs rechecked on June 4 prior to her treatment I plan to see her at the end of her radiation therapy After she completes radiation treatment, we will wait approximately a month before scheduling further chemotherapy She is in agreement with the plan of care  Nausea without vomiting She will continue antiemetics as needed  Peripheral neuropathy due to chemotherapy [facility]) she has mild peripheral neuropathy, likely related to side effects of treatment. It is only mild, not bothering the patient. I will observe for now If it gets worse in the future, I will consider modifying the dose of the treatment   No orders of the defined types were placed in this encounter.   All questions were answered. The patient knows to call the clinic with any problems, questions or concerns. The total time spent in the appointment was 20 minutes encounter with patients including review of chart and various tests results, discussions about plan of care and coordination of care plan   Heath Lark, MD 10/06/2019 6:13 PM  INTERVAL HISTORY: Please see below for problem oriented charting. 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 Possible 1.9 cm solid echogenic endometrial mass. In the setting of post-menopausal bleeding, endometrial sampling is indicated to exclude carcinoma. I 2. Enlarged uterus with at least 1 calcified fibroid at the fundus measuring 2.6 cm. 3. Heterogenous fluid within the endometrial canal, can be seen in the setting of cervical obstruction. 4. Nonvisualized ovaries   06/30/2019 Initial Biopsy   EMB: high grade adenocarcinoma   07/14/2019 Tumor Marker   CA-125: 354   07/21/2019 Imaging   CT A/P: IMPRESSION: 1. No gross extension of uterine carcinoma beyond the myometrium. 2. No pelvic lymphadenopathy. 3. No retroperitoneal periaortic adenopathy. 4. No visceral metastasis or skeletal metastasis. 5. Benign adenoma of the LEFT adrenal gland.  Benign renal cysts.   08/15/2019 Surgery   Robotic-assisted laparoscopic total hysterectomy with bilateral salpingoophorectomy, SLN injection, bilateral pelvic LND, mini-lap for specimen removal Modifier 22: significant obesity causing difficulty visualizing anatomy, increasing OR time    08/15/2019 Pathology Results   A. 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  - The surgical resection margins are negative for carcinoma.  - See oncology table below.   Myometrium: Leiomyomata.  Serosa: Unremarkable.  Bilateral adnexa: Benign ovaries and fallopian tubes with endometriosis.   B. LYMPH NODES, RIGHT PELVIC, RESECTION:  - There is no evidence  of carcinoma in 6 of 6 lymph nodes (0/6).   C. LYMPH NODES, LEFT PELVIC, RESECTION:  - There is no  evidence of carcinoma in 7 of 7 lymph nodes (0/7).    ONCOLOGY TABLE:   UTERUS, CARCINOMA OR CARCINOSARCOMA   Procedure: Total hysterectomy and bilateral salpingo-oophorectomy  Histologic type: Clear cell adenocarcinoma  Histologic Grade: High-grade  Myometrial invasion:       Depth of invasion: 19.5 mm       Myometrial thickness: 20 mm  Uterine Serosa Involvement: Not definitively identified  Cervical stromal involvement: Present  Extent of involvement of other organs: Confined to lower uterine segment  Lymphovascular invasion: Not identified  Regional Lymph Nodes:       Examined:        0 Sentinel                               13 non-sentinel                               13 total        Lymph nodes with metastasis: 0  Representative Tumor Block: A17  MMR / MSI testing: Can be ordered upon clinician request.  Pathologic Stage Classification (pTNM, AJCC 8th edition):  pT2, pN0  (FIGO stage II)  Comments: Dr. Claudette Laws has reviewed selected slides and concurs with  the phenotype of this tumor.  Additional studies can be performed upon  clinician request.    08/15/2019 Cancer Staging   Staging form: Corpus Uteri - Carcinoma and Carcinosarcoma, AJCC 8th Edition - Clinical stage from 08/15/2019: FIGO Stage II (cT2, cN0, cM0) - Signed by Lafonda Mosses, MD on 08/23/2019   09/18/2019 Imaging   Status post recent hysterectomy and bilateral salpingo-oophorectomy. Loculated fluid collections along the lateral pelvic sidewall may represent postoperative seroma or lymphocele. Superimposed infection or abscess are not excluded. Clinical correlation is recommended.   09/18/2019 Genetic Testing   PNegative genetic testing:  No pathogenic variants detected on the [facility] Common Hereditary Cancers Panel. The report date is 09/18/2019.  The Common Hereditary Cancers Panel offered by [facility] includes sequencing and/or deletion duplication testing of the following 48 genes: APC, ATM, AXIN2,  BARD1, BMPR1A, BRCA1, BRCA2, BRIP1, CDH1, CDK4, CDKN2A (p14ARF), CDKN2A (p16INK4a), CHEK2, CTNNA1, DICER1, EPCAM (Deletion/duplication testing only), GREM1 (promoter region deletion/duplication testing only), KIT, MEN1, MLH1, MSH2, MSH3, MSH6, MUTYH, NBN, NF1, NHTL1, PALB2, PDGFRA, PMS2, POLD1, POLE, PTEN, RAD50, RAD51C, RAD51D, RNF43, SDHB, SDHC, SDHD, SMAD4, SMARCA4. STK11, TP53, TSC1, TSC2, and VHL.  The following genes were evaluated for sequence changes only: SDHA and HOXB13 c.251G>A variant only.    09/20/2019 Procedure   Ultrasound and fluoroscopically guided right internal jugular single lumen power port catheter insertion. Tip in the SVC/RA junction. Catheter ready for use.     REVIEW OF SYSTEMS:   Constitutional: Denies fevers, chills or abnormal weight loss Eyes: Denies blurriness of vision Ears, nose, mouth, throat, and face: Denies mucositis or sore throat Respiratory: Denies cough, dyspnea or wheezes Cardiovascular: Denies palpitation, chest discomfort or lower extremity swelling Skin: Denies abnormal skin rashes Lymphatics: Denies new lymphadenopathy or easy bruising Behavioral/Psych: Mood is stable, no new changes  All other systems were reviewed with the patient and are negative.  I have reviewed the past medical history, past surgical history, social history and family history with the patient and they are unchanged  from previous note.  ALLERGIES:  is allergic to lisinopril and saxenda [liraglutide -weight management].  MEDICATIONS:  Current Outpatient Medications  Medication Sig Dispense Refill  . acetaminophen (TYLENOL) 500 MG tablet Take 500 mg by mouth every 6 (six) hours as needed.    Marland Kitchen amLODipine (NORVASC) 2.5 MG tablet Take 2.5 mg by mouth daily.    . hydrochlorothiazide (HYDRODIURIL) 25 MG tablet Take 25 mg by mouth daily.    Marland Kitchen lidocaine-prilocaine (EMLA) cream Apply to affected area once 30 g 3  . meloxicam (MOBIC) 15 MG tablet Take 15 mg by mouth daily.    .  Multiple Vitamin (MULTIVITAMIN) tablet Take 1 tablet by mouth daily.    . ondansetron (ZOFRAN) 8 MG tablet Take 1 tablet (8 mg total) by mouth 2 (two) times daily as needed. Start on the third day after chemotherapy. 30 tablet 1  . prochlorperazine (COMPAZINE) 10 MG tablet Take 1 tablet (10 mg total) by mouth every 6 (six) hours as needed (Nausea or vomiting). 30 tablet 1  . rosuvastatin (CRESTOR) 20 MG tablet Take 20 mg by mouth daily.     No current facility-administered medications for this visit.    PHYSICAL EXAMINATION: ECOG PERFORMANCE STATUS: 1 - Symptomatic but completely ambulatory  Vitals:   10/06/19 1152  BP: 128/74  Pulse: 85  Resp: 18  Temp: 98.5 F (36.9 C)  SpO2: 99%   Filed Weights   10/06/19 1152  Weight: 270 lb (122.5 kg)    GENERAL:alert, no distress and comfortable NEURO: alert & oriented x 3 with fluent speech, no focal motor/sensory deficits  LABORATORY DATA:  I have reviewed the data as listed    Component Value Date/Time   NA 139 09/20/2019 1112   K 3.6 09/20/2019 1112   CL 104 09/20/2019 1112   CO2 27 09/20/2019 1112   GLUCOSE 99 09/20/2019 1112   BUN 13 09/20/2019 1112   CREATININE 1.06 (H) 09/20/2019 1112   CALCIUM 9.4 09/20/2019 1112   PROT 7.7 09/20/2019 1112   ALBUMIN 4.0 09/20/2019 1112   AST 19 09/20/2019 1112   ALT 16 09/20/2019 1112   ALKPHOS 71 09/20/2019 1112   BILITOT 0.8 09/20/2019 1112   GFRNONAA 55 (L) 09/20/2019 1112   GFRAA >60 09/20/2019 1112    No results found for: SPEP, UPEP  Lab Results  Component Value Date   WBC 7.3 09/20/2019   NEUTROABS 4.4 09/20/2019   HGB 12.3 09/20/2019   HCT 39.3 09/20/2019   MCV 89.7 09/20/2019   PLT 289 09/20/2019      Chemistry      Component Value Date/Time   NA 139 09/20/2019 1112   K 3.6 09/20/2019 1112   CL 104 09/20/2019 1112   CO2 27 09/20/2019 1112   BUN 13 09/20/2019 1112   CREATININE 1.06 (H) 09/20/2019 1112      Component Value Date/Time   CALCIUM 9.4  09/20/2019 1112   ALKPHOS 71 09/20/2019 1112   AST 19 09/20/2019 1112   ALT 16 09/20/2019 1112   BILITOT 0.8 09/20/2019 1112       RADIOGRAPHIC STUDIES: I have personally reviewed the radiological images as listed and agreed with the findings in the report. CT PELVIS W CONTRAST  Result Date: 09/18/2019 CLINICAL DATA:  64 year old female with rectal pain and pressure. Rule out hematoma or seroma. History of endometrial cancer status post recent hysterectomy and bilateral salpingo-oophorectomy. EXAM: CT PELVIS WITH CONTRAST TECHNIQUE: Multidetector CT imaging of the pelvis was performed using the  standard protocol following the bolus administration of intravenous contrast. CONTRAST:  150m OMNIPAQUE IOHEXOL 300 MG/ML  SOLN COMPARISON:  CT abdomen pelvis dated 07/21/2019. FINDINGS: Urinary Tract: Partially visualized 3 cm fluid attenuating structure from the inferior pole of the left kidney. The visualized ureters appear unremarkable. The urinary bladder is mildly distended. Mild thickened and hazy appearance of the bladder wall, likely reactive to inflammatory changes of the pelvis. Bowel: No bowel dilatation or active inflammation in the visualized pelvis. The appendix is unremarkable. Vascular/Lymphatic: Visualized aorta and iliac arteries appear unremarkable. No adenopathy. Reproductive: Status post recent hysterectomy and bilateral salpingo-oophorectomy. There are loculated fluid collections along the lateral pelvic sidewall in the expected region of the ovaries. These collections measure approximately 7.1 x 3.5 cm on the right and 6.1 x 2.5 cm on the left. These may represent postoperative seroma but concerning for lymphocele. Superimposed infection or abscess are not excluded. Clinical correlation is recommended. Other:  There is a small free fluid within the pelvis. Musculoskeletal: Degenerative changes of the visualized lower lumbar spine and hips. No acute osseous pathology. IMPRESSION: Status  post recent hysterectomy and bilateral salpingo-oophorectomy. Loculated fluid collections along the lateral pelvic sidewall may represent postoperative seroma or lymphocele. Superimposed infection or abscess are not excluded. Clinical correlation is recommended. Electronically Signed   By: AAnner CreteM.D.   On: 09/18/2019 20:05   IR IMAGING GUIDED PORT INSERTION  Result Date: 09/20/2019 CLINICAL DATA:  ENDOMETRIAL CARCINOMA, ACCESS FOR CHEMOTHERAPY EXAM: RIGHT INTERNAL JUGULAR SINGLE LUMEN POWER PORT CATHETER INSERTION Date:  09/20/2019 09/20/2019 1:04 pm Radiologist:  MJerilynn Mages TDaryll Brod MD Guidance:  Ultrasound fluoroscopic MEDICATIONS: Ancef 2 g; The antibiotic was administered within an appropriate time interval prior to skin puncture. ANESTHESIA/SEDATION: Versed 2.0 mg IV; Fentanyl 100 mcg IV; Moderate Sedation Time:  24 minutes The patient was continuously monitored during the procedure by the interventional radiology nurse under my direct supervision. FLUOROSCOPY TIME:  0 minutes, 36 seconds (15 mGy) COMPLICATIONS: None immediate. CONTRAST:  None. PROCEDURE: Informed consent was obtained from the patient following explanation of the procedure, risks, benefits and alternatives. The patient understands, agrees and consents for the procedure. All questions were addressed. A time out was performed. Maximal barrier sterile technique utilized including caps, mask, sterile gowns, sterile gloves, large sterile drape, hand hygiene, and 2% chlorhexidine scrub. Under sterile conditions and local anesthesia, right internal jugular micropuncture venous access was performed. Access was performed with ultrasound. Images were obtained for documentation of the patent right internal jugular vein. A guide wire was inserted followed by a transitional dilator. This allowed insertion of a guide wire and catheter into the IVC. Measurements were obtained from the SVC / RA junction back to the right IJ venotomy site. In the right  infraclavicular chest, a subcutaneous pocket was created over the second anterior rib. This was done under sterile conditions and local anesthesia. 1% lidocaine with epinephrine was utilized for this. A 2.5 cm incision was made in the skin. Blunt dissection was performed to create a subcutaneous pocket over the right pectoralis major muscle. The pocket was flushed with saline vigorously. There was adequate hemostasis. The port catheter was assembled and checked for leakage. The port catheter was secured in the pocket with two retention sutures. The tubing was tunneled subcutaneously to the right venotomy site and inserted into the SVC/RA junction through a valved peel-away sheath. Position was confirmed with fluoroscopy. Images were obtained for documentation. The patient tolerated the procedure well. No immediate complications. Incisions were closed  in a two layer fashion with 4 - 0 Vicryl suture. Dermabond was applied to the skin. The port catheter was accessed, blood was aspirated followed by saline and heparin flushes. Needle was removed. A dry sterile dressing was applied. IMPRESSION: Ultrasound and fluoroscopically guided right internal jugular single lumen power port catheter insertion. Tip in the SVC/RA junction. Catheter ready for use. Electronically Signed   By: Jerilynn Mages.  Shick M.D.   On: 09/20/2019 13:59

## 2019-10-06 NOTE — Assessment & Plan Note (Signed)
Overall, she tolerated treatment fairly well except for some very mild nausea and very minor neuropathy affecting her left foot She will continue radiation as scheduled She is due for chemotherapy again in about 2-1/2 weeks She will get her labs rechecked on June 4 prior to her treatment I plan to see her at the end of her radiation therapy After she completes radiation treatment, we will wait approximately a month before scheduling further chemotherapy She is in agreement with the plan of care

## 2019-10-09 ENCOUNTER — Ambulatory Visit
Admission: RE | Admit: 2019-10-09 | Discharge: 2019-10-09 | Disposition: A | Discharge status: Home / Self Care | Payer: 59 | Source: Ambulatory Visit | Attending: Radiation Oncology | Admitting: Radiation Oncology

## 2019-10-09 ENCOUNTER — Other Ambulatory Visit: Payer: Self-pay

## 2019-10-09 ENCOUNTER — Telehealth: Payer: Self-pay | Admitting: Hematology and Oncology

## 2019-10-09 DIAGNOSIS — Z510 Encounter for antineoplastic radiation therapy: Secondary | ICD-10-CM | POA: Diagnosis not present

## 2019-10-09 NOTE — Telephone Encounter (Signed)
Scheduled apt per 5/21 sch message =- pt aware of appt date and time

## 2019-10-10 ENCOUNTER — Other Ambulatory Visit: Payer: Self-pay

## 2019-10-10 ENCOUNTER — Ambulatory Visit
Admission: RE | Admit: 2019-10-10 | Discharge: 2019-10-10 | Disposition: A | Discharge status: Home / Self Care | Payer: 59 | Source: Ambulatory Visit | Attending: Radiation Oncology | Admitting: Radiation Oncology

## 2019-10-10 DIAGNOSIS — Z510 Encounter for antineoplastic radiation therapy: Secondary | ICD-10-CM | POA: Diagnosis not present

## 2019-10-11 ENCOUNTER — Ambulatory Visit
Admission: RE | Admit: 2019-10-11 | Discharge: 2019-10-11 | Disposition: A | Discharge status: Home / Self Care | Payer: 59 | Source: Ambulatory Visit | Attending: Radiation Oncology | Admitting: Radiation Oncology

## 2019-10-11 ENCOUNTER — Other Ambulatory Visit: Payer: Self-pay

## 2019-10-11 DIAGNOSIS — Z510 Encounter for antineoplastic radiation therapy: Secondary | ICD-10-CM | POA: Diagnosis not present

## 2019-10-12 ENCOUNTER — Ambulatory Visit
Admission: RE | Admit: 2019-10-12 | Discharge: 2019-10-12 | Disposition: A | Discharge status: Home / Self Care | Payer: 59 | Source: Ambulatory Visit | Attending: Radiation Oncology | Admitting: Radiation Oncology

## 2019-10-12 ENCOUNTER — Other Ambulatory Visit: Payer: Self-pay

## 2019-10-12 DIAGNOSIS — Z510 Encounter for antineoplastic radiation therapy: Secondary | ICD-10-CM | POA: Diagnosis not present

## 2019-10-13 ENCOUNTER — Other Ambulatory Visit: Payer: Self-pay

## 2019-10-13 ENCOUNTER — Ambulatory Visit
Admission: RE | Admit: 2019-10-13 | Discharge: 2019-10-13 | Disposition: A | Discharge status: Home / Self Care | Payer: 59 | Source: Ambulatory Visit | Attending: Radiation Oncology | Admitting: Radiation Oncology

## 2019-10-13 DIAGNOSIS — Z510 Encounter for antineoplastic radiation therapy: Secondary | ICD-10-CM | POA: Diagnosis not present

## 2019-10-17 ENCOUNTER — Other Ambulatory Visit: Payer: Self-pay

## 2019-10-17 ENCOUNTER — Ambulatory Visit
Admission: RE | Admit: 2019-10-17 | Discharge: 2019-10-17 | Disposition: A | Discharge status: Home / Self Care | Payer: 59 | Source: Ambulatory Visit | Attending: Gynecologic Oncology | Admitting: Gynecologic Oncology

## 2019-10-17 DIAGNOSIS — C541 Malignant neoplasm of endometrium: Secondary | ICD-10-CM | POA: Insufficient documentation

## 2019-10-17 DIAGNOSIS — Z510 Encounter for antineoplastic radiation therapy: Secondary | ICD-10-CM | POA: Insufficient documentation

## 2019-10-18 ENCOUNTER — Ambulatory Visit
Admission: RE | Admit: 2019-10-18 | Discharge: 2019-10-18 | Disposition: A | Discharge status: Home / Self Care | Payer: 59 | Source: Ambulatory Visit | Attending: Radiation Oncology | Admitting: Radiation Oncology

## 2019-10-18 ENCOUNTER — Other Ambulatory Visit: Payer: Self-pay

## 2019-10-18 DIAGNOSIS — Z510 Encounter for antineoplastic radiation therapy: Secondary | ICD-10-CM | POA: Diagnosis not present

## 2019-10-19 ENCOUNTER — Ambulatory Visit
Admission: RE | Admit: 2019-10-19 | Discharge: 2019-10-19 | Disposition: A | Discharge status: Home / Self Care | Payer: 59 | Source: Ambulatory Visit | Attending: Radiation Oncology | Admitting: Radiation Oncology

## 2019-10-19 ENCOUNTER — Other Ambulatory Visit: Payer: Self-pay

## 2019-10-19 DIAGNOSIS — Z510 Encounter for antineoplastic radiation therapy: Secondary | ICD-10-CM | POA: Diagnosis not present

## 2019-10-20 ENCOUNTER — Ambulatory Visit
Admission: RE | Admit: 2019-10-20 | Discharge: 2019-10-20 | Disposition: A | Discharge status: Home / Self Care | Payer: 59 | Source: Ambulatory Visit | Attending: Radiation Oncology | Admitting: Radiation Oncology

## 2019-10-20 ENCOUNTER — Inpatient Hospital Stay: Payer: 59 | Attending: Hematology and Oncology

## 2019-10-20 ENCOUNTER — Inpatient Hospital Stay: Payer: 59

## 2019-10-20 ENCOUNTER — Other Ambulatory Visit: Payer: Self-pay

## 2019-10-20 DIAGNOSIS — C541 Malignant neoplasm of endometrium: Secondary | ICD-10-CM | POA: Diagnosis present

## 2019-10-20 DIAGNOSIS — G620 Drug-induced polyneuropathy: Secondary | ICD-10-CM | POA: Insufficient documentation

## 2019-10-20 DIAGNOSIS — T451X5A Adverse effect of antineoplastic and immunosuppressive drugs, initial encounter: Secondary | ICD-10-CM | POA: Insufficient documentation

## 2019-10-20 DIAGNOSIS — Z6841 Body Mass Index (BMI) 40.0 and over, adult: Secondary | ICD-10-CM | POA: Diagnosis not present

## 2019-10-20 DIAGNOSIS — Z5111 Encounter for antineoplastic chemotherapy: Secondary | ICD-10-CM | POA: Insufficient documentation

## 2019-10-20 LAB — COMPREHENSIVE METABOLIC PANEL
ALT: 19 U/L (ref 0–44)
AST: 18 U/L (ref 15–41)
Albumin: 3.7 g/dL (ref 3.5–5.0)
Alkaline Phosphatase: 78 U/L (ref 38–126)
Anion gap: 11 (ref 5–15)
BUN: 15 mg/dL (ref 8–23)
CO2: 28 mmol/L (ref 22–32)
Calcium: 9.9 mg/dL (ref 8.9–10.3)
Chloride: 102 mmol/L (ref 98–111)
Creatinine, Ser: 1.03 mg/dL — ABNORMAL HIGH (ref 0.44–1.00)
GFR calc Af Amer: >60 mL/min (ref >60)
GFR calc non Af Amer: 57 mL/min — ABNORMAL LOW (ref >60)
Glucose, Bld: 92 mg/dL (ref 70–99)
Potassium: 3.8 mmol/L (ref 3.5–5.1)
Sodium: 141 mmol/L (ref 135–145)
Total Bilirubin: 0.4 mg/dL (ref 0.3–1.2)
Total Protein: 7.5 g/dL (ref 6.5–8.1)

## 2019-10-20 LAB — CBC WITH DIFFERENTIAL (CANCER CENTER ONLY)
Abs Immature Granulocytes: 0.03 10*3/uL (ref 0.00–0.07)
Basophils Absolute: 0 10*3/uL (ref 0.0–0.1)
Basophils Relative: 0 %
Eosinophils Absolute: 0.3 10*3/uL (ref 0.0–0.5)
Eosinophils Relative: 6 %
HCT: 37.4 % (ref 36.0–46.0)
Hemoglobin: 11.8 g/dL — ABNORMAL LOW (ref 12.0–15.0)
Immature Granulocytes: 1 %
Lymphocytes Relative: 7 %
Lymphs Abs: 0.3 10*3/uL — ABNORMAL LOW (ref 0.7–4.0)
MCH: 27.8 pg (ref 26.0–34.0)
MCHC: 31.6 g/dL (ref 30.0–36.0)
MCV: 88 fL (ref 80.0–100.0)
Monocytes Absolute: 0.5 10*3/uL (ref 0.1–1.0)
Monocytes Relative: 11 %
Neutro Abs: 3.8 10*3/uL (ref 1.7–7.7)
Neutrophils Relative %: 75 %
Platelet Count: 171 10*3/uL (ref 150–400)
RBC: 4.25 MIL/uL (ref 3.87–5.11)
RDW: 14.3 % (ref 11.5–15.5)
WBC Count: 5 10*3/uL (ref 4.0–10.5)
nRBC: 0 % (ref 0.0–0.2)

## 2019-10-20 LAB — MAGNESIUM: Magnesium: 1.5 mg/dL — ABNORMAL LOW (ref 1.7–2.4)

## 2019-10-20 MED ORDER — HEPARIN SOD (PORK) LOCK FLUSH 100 UNIT/ML IV SOLN
500.0000 [IU] | Freq: Once | INTRAVENOUS | Status: AC
  Administered 2019-10-20: 500 [IU]
  Filled 2019-10-20: qty 5

## 2019-10-20 MED ORDER — SODIUM CHLORIDE 0.9% FLUSH
10.0000 mL | Freq: Once | INTRAVENOUS | Status: AC
  Administered 2019-10-20: 10 mL
  Filled 2019-10-20: qty 10

## 2019-10-20 NOTE — Progress Notes (Signed)
Per Dr. Alvy Bimler, increase magnesium in cisplatin fluids to 6 grams for 6/7 treatment.   Demetrius Charity, PharmD, BCPS, [facility] Pharmacist Pharmacy Phone: [phone] 10/20/2019

## 2019-10-23 ENCOUNTER — Other Ambulatory Visit: Payer: Self-pay

## 2019-10-23 ENCOUNTER — Inpatient Hospital Stay: Payer: 59

## 2019-10-23 ENCOUNTER — Ambulatory Visit
Admission: RE | Admit: 2019-10-23 | Discharge: 2019-10-23 | Disposition: A | Discharge status: Home / Self Care | Payer: 59 | Source: Ambulatory Visit | Attending: Radiation Oncology | Admitting: Radiation Oncology

## 2019-10-23 VITALS — BP 131/62 | HR 87 | Temp 98.7°F | Resp 18

## 2019-10-23 DIAGNOSIS — Z5111 Encounter for antineoplastic chemotherapy: Secondary | ICD-10-CM | POA: Diagnosis not present

## 2019-10-23 DIAGNOSIS — C541 Malignant neoplasm of endometrium: Secondary | ICD-10-CM

## 2019-10-23 MED ORDER — PALONOSETRON HCL INJECTION 0.25 MG/5ML
0.2500 mg | Freq: Once | INTRAVENOUS | Status: AC
  Administered 2019-10-23: 0.25 mg via INTRAVENOUS

## 2019-10-23 MED ORDER — HEPARIN SOD (PORK) LOCK FLUSH 100 UNIT/ML IV SOLN
500.0000 [IU] | Freq: Once | INTRAVENOUS | Status: AC | PRN
  Administered 2019-10-23: 500 [IU]
  Filled 2019-10-23: qty 5

## 2019-10-23 MED ORDER — FAMOTIDINE IN NACL 20-0.9 MG/50ML-% IV SOLN
INTRAVENOUS | Status: AC
  Filled 2019-10-23: qty 50

## 2019-10-23 MED ORDER — POTASSIUM CHLORIDE 2 MEQ/ML IV SOLN
Freq: Once | INTRAVENOUS | Status: AC
  Filled 2019-10-23: qty 1000

## 2019-10-23 MED ORDER — SODIUM CHLORIDE 0.9% FLUSH
10.0000 mL | INTRAVENOUS | Status: DC | PRN
  Administered 2019-10-23: 10 mL
  Filled 2019-10-23: qty 10

## 2019-10-23 MED ORDER — SODIUM CHLORIDE 0.9 % IV SOLN
40.0000 mg/m2 | Freq: Once | INTRAVENOUS | Status: AC
  Administered 2019-10-23: 73 mg via INTRAVENOUS
  Filled 2019-10-23: qty 73

## 2019-10-23 MED ORDER — DIPHENHYDRAMINE HCL 50 MG/ML IJ SOLN
INTRAMUSCULAR | Status: AC
  Filled 2019-10-23: qty 1

## 2019-10-23 MED ORDER — SODIUM CHLORIDE 0.9 % IV SOLN
150.0000 mg | Freq: Once | INTRAVENOUS | Status: AC
  Administered 2019-10-23: 150 mg via INTRAVENOUS
  Filled 2019-10-23: qty 150

## 2019-10-23 MED ORDER — SODIUM CHLORIDE 0.9 % IV SOLN
10.0000 mg | Freq: Once | INTRAVENOUS | Status: AC
  Administered 2019-10-23: 10 mg via INTRAVENOUS
  Filled 2019-10-23: qty 10

## 2019-10-23 MED ORDER — PALONOSETRON HCL INJECTION 0.25 MG/5ML
INTRAVENOUS | Status: AC
  Filled 2019-10-23: qty 5

## 2019-10-23 MED ORDER — SODIUM CHLORIDE 0.9 % IV SOLN
Freq: Once | INTRAVENOUS | Status: AC
  Filled 2019-10-23: qty 250

## 2019-10-23 NOTE — Patient Instructions (Signed)

## 2019-10-24 ENCOUNTER — Ambulatory Visit
Admission: RE | Admit: 2019-10-24 | Discharge: 2019-10-24 | Disposition: A | Discharge status: Home / Self Care | Payer: 59 | Source: Ambulatory Visit | Attending: Radiation Oncology | Admitting: Radiation Oncology

## 2019-10-24 ENCOUNTER — Other Ambulatory Visit: Payer: Self-pay

## 2019-10-24 DIAGNOSIS — Z510 Encounter for antineoplastic radiation therapy: Secondary | ICD-10-CM | POA: Diagnosis not present

## 2019-10-25 ENCOUNTER — Other Ambulatory Visit: Payer: Self-pay

## 2019-10-25 ENCOUNTER — Ambulatory Visit
Admission: RE | Admit: 2019-10-25 | Discharge: 2019-10-25 | Disposition: A | Discharge status: Home / Self Care | Payer: 59 | Source: Ambulatory Visit | Attending: Radiation Oncology | Admitting: Radiation Oncology

## 2019-10-25 DIAGNOSIS — Z510 Encounter for antineoplastic radiation therapy: Secondary | ICD-10-CM | POA: Diagnosis not present

## 2019-10-26 ENCOUNTER — Ambulatory Visit
Admission: RE | Admit: 2019-10-26 | Discharge: 2019-10-26 | Disposition: A | Discharge status: Home / Self Care | Payer: 59 | Source: Ambulatory Visit | Attending: Radiation Oncology | Admitting: Radiation Oncology

## 2019-10-26 ENCOUNTER — Other Ambulatory Visit: Payer: Self-pay

## 2019-10-26 DIAGNOSIS — Z510 Encounter for antineoplastic radiation therapy: Secondary | ICD-10-CM | POA: Diagnosis not present

## 2019-10-27 ENCOUNTER — Other Ambulatory Visit: Payer: Self-pay

## 2019-10-27 ENCOUNTER — Ambulatory Visit
Admission: RE | Admit: 2019-10-27 | Discharge: 2019-10-27 | Disposition: A | Discharge status: Home / Self Care | Payer: 59 | Source: Ambulatory Visit | Attending: Radiation Oncology | Admitting: Radiation Oncology

## 2019-10-27 DIAGNOSIS — Z510 Encounter for antineoplastic radiation therapy: Secondary | ICD-10-CM | POA: Diagnosis not present

## 2019-10-30 ENCOUNTER — Other Ambulatory Visit: Payer: Self-pay

## 2019-10-30 ENCOUNTER — Ambulatory Visit
Admission: RE | Admit: 2019-10-30 | Discharge: 2019-10-30 | Disposition: A | Discharge status: Home / Self Care | Payer: 59 | Source: Ambulatory Visit | Attending: Radiation Oncology | Admitting: Radiation Oncology

## 2019-10-30 ENCOUNTER — Ambulatory Visit: Payer: 59

## 2019-10-30 DIAGNOSIS — Z510 Encounter for antineoplastic radiation therapy: Secondary | ICD-10-CM | POA: Diagnosis not present

## 2019-10-31 ENCOUNTER — Ambulatory Visit: Payer: 59

## 2019-10-31 ENCOUNTER — Ambulatory Visit
Admission: RE | Admit: 2019-10-31 | Discharge: 2019-10-31 | Disposition: A | Discharge status: Home / Self Care | Payer: 59 | Source: Ambulatory Visit | Attending: Radiation Oncology | Admitting: Radiation Oncology

## 2019-10-31 ENCOUNTER — Encounter: Payer: Self-pay | Admitting: Radiation Oncology

## 2019-10-31 ENCOUNTER — Other Ambulatory Visit: Payer: Self-pay

## 2019-10-31 DIAGNOSIS — Z510 Encounter for antineoplastic radiation therapy: Secondary | ICD-10-CM | POA: Diagnosis not present

## 2019-10-31 DIAGNOSIS — Z923 Personal history of irradiation: Secondary | ICD-10-CM

## 2019-10-31 HISTORY — DX: Personal history of irradiation: Z92.3

## 2019-11-01 ENCOUNTER — Other Ambulatory Visit: Payer: Self-pay

## 2019-11-01 ENCOUNTER — Ambulatory Visit: Payer: 59

## 2019-11-02 ENCOUNTER — Ambulatory Visit: Payer: 59

## 2019-11-02 ENCOUNTER — Other Ambulatory Visit: Payer: Self-pay

## 2019-11-02 ENCOUNTER — Telehealth: Payer: Self-pay | Admitting: Hematology and Oncology

## 2019-11-02 ENCOUNTER — Inpatient Hospital Stay (HOSPITAL_BASED_OUTPATIENT_CLINIC_OR_DEPARTMENT_OTHER): Payer: 59 | Admitting: Hematology and Oncology

## 2019-11-02 ENCOUNTER — Encounter: Payer: Self-pay | Admitting: Hematology and Oncology

## 2019-11-02 VITALS — BP 146/78 | HR 81 | Temp 98.3°F | Resp 18 | Ht 61.0 in | Wt 269.4 lb

## 2019-11-02 DIAGNOSIS — R110 Nausea: Secondary | ICD-10-CM

## 2019-11-02 DIAGNOSIS — G620 Drug-induced polyneuropathy: Secondary | ICD-10-CM | POA: Diagnosis not present

## 2019-11-02 DIAGNOSIS — E66813 Obesity, class 3: Secondary | ICD-10-CM

## 2019-11-02 DIAGNOSIS — Z5111 Encounter for antineoplastic chemotherapy: Secondary | ICD-10-CM | POA: Diagnosis not present

## 2019-11-02 DIAGNOSIS — T451X5A Adverse effect of antineoplastic and immunosuppressive drugs, initial encounter: Secondary | ICD-10-CM

## 2019-11-02 DIAGNOSIS — C541 Malignant neoplasm of endometrium: Secondary | ICD-10-CM

## 2019-11-02 MED ORDER — DEXAMETHASONE 4 MG PO TABS: ORAL_TABLET | ORAL | 4 refills | Status: DC

## 2019-11-02 NOTE — Progress Notes (Signed)
[place] OFFICE PROGRESS NOTE  Patient Care Team: [name], MD as PCP - General (Family Medicine) Awanda Mink Craige Cotta, RN as Oncology Nurse Navigator (Oncology)  ASSESSMENT & PLAN:  Endometrial cancer [facility]) Overall, she tolerated treatment fairly well except for some very mild nausea and very minor neuropathy affecting her left foot She will continue radiation as scheduled After she completed radiation treatment, I plan to start 4 cycles of carboplatin and paclitaxel every 3 weeks starting the first week of August   We reviewed the NCCN guidelines We discussed the role of chemotherapy. The intent is of curative intent.  We discussed some of the risks, benefits, side-effects of carboplatin & Taxol. Treatment is intravenous, every 3 weeks x 6 cycles  Some of the short term side-effects included, though not limited to, including weight loss, life threatening infections, risk of allergic reactions, need for transfusions of blood products, nausea, vomiting, change in bowel habits, loss of hair, admission to hospital for various reasons, and risks of death.   Long term side-effects are also discussed including risks of infertility, permanent damage to nerve function, hearing loss, chronic fatigue, kidney damage with possibility needing hemodialysis, and rare secondary malignancy including bone marrow disorders.  The patient is aware that the response rates discussed earlier is not guaranteed.  After a long discussion, patient made an informed decision to proceed with the prescribed plan of care.   Patient education material was dispensed. We discussed premedication with dexamethasone before chemotherapy. We discussed the use of cranial prosthesis  Obesity, Class III, BMI 40-49.9 (morbid obesity) ([place]) I plan to use adjusted body surface area for treatment planning  Peripheral neuropathy due to chemotherapy [facility]) she has mild peripheral neuropathy, likely related to side  effects of treatment. It is only mild, not bothering the patient. I will observe for now If it gets worse in the future, I will consider modifying the dose of the treatment   Orders Placed This Encounter  Procedures  . CBC with Differential (Cancer Center Only)    Standing Status:   Standing    Number of Occurrences:   20    Standing Expiration Date:   11/01/2020  . CMP ([place] only)    Standing Status:   Standing    Number of Occurrences:   20    Standing Expiration Date:   11/01/2020    All questions were answered. The patient knows to call the clinic with any problems, questions or concerns. The total time spent in the appointment was 30 minutes encounter with patients including review of chart and various tests results, discussions about plan of care and coordination of care plan   Heath Lark, MD 11/02/2019 11:12 AM  INTERVAL HISTORY: Please see below for problem oriented charting. 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 Possible 1.9 cm solid echogenic endometrial mass. In the setting of post-menopausal bleeding, endometrial sampling is indicated to exclude carcinoma. I 2. Enlarged uterus with at least 1 calcified fibroid at the fundus measuring 2.6 cm. 3. Heterogenous fluid  within the endometrial canal, can be seen in the setting of cervical obstruction. 4. Nonvisualized ovaries   06/30/2019 Initial Biopsy   EMB: high grade adenocarcinoma   07/14/2019 Tumor Marker    CA-125: 354   07/21/2019 Imaging   CT A/P: IMPRESSION: 1. No gross extension of uterine carcinoma beyond the myometrium. 2. No pelvic lymphadenopathy. 3. No retroperitoneal periaortic adenopathy. 4. No visceral metastasis or skeletal metastasis. 5. Benign adenoma of the LEFT adrenal gland.  Benign renal cysts.   08/15/2019 Surgery   Robotic-assisted laparoscopic total hysterectomy with bilateral salpingoophorectomy, SLN injection, bilateral pelvic LND, mini-lap for specimen removal Modifier 22: significant obesity causing difficulty visualizing anatomy, increasing OR time    08/15/2019 Pathology Results   A. 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  - The surgical resection margins are negative for carcinoma.  - See oncology table below.   Myometrium: Leiomyomata.  Serosa: Unremarkable.  Bilateral adnexa: Benign ovaries and fallopian tubes with endometriosis.   B. LYMPH NODES, RIGHT PELVIC, RESECTION:  - There is no evidence of carcinoma in 6 of 6 lymph nodes (0/6).   C. LYMPH NODES, LEFT PELVIC, RESECTION:  - There is no evidence of carcinoma in 7 of 7 lymph nodes (0/7).    ONCOLOGY TABLE:   UTERUS, CARCINOMA OR CARCINOSARCOMA   Procedure: Total hysterectomy and bilateral salpingo-oophorectomy  Histologic type: Clear cell adenocarcinoma  Histologic Grade: High-grade  Myometrial invasion:       Depth of invasion: 19.5 mm       Myometrial thickness: 20 mm  Uterine Serosa Involvement: Not definitively identified  Cervical stromal involvement: Present  Extent of involvement of other organs: Confined to lower uterine segment  Lymphovascular invasion: Not identified  Regional Lymph Nodes:       Examined:        0 Sentinel                               13 non-sentinel                                13 total        Lymph nodes with metastasis: 0  Representative Tumor Block: A17  MMR / MSI testing: Can be ordered upon clinician request.  Pathologic Stage Classification (pTNM, AJCC 8th edition):  pT2, pN0  (FIGO stage II)  Comments: Dr. Claudette Laws has reviewed selected slides and concurs with  the phenotype of this tumor.  Additional studies can be performed upon  clinician request.    08/15/2019 Cancer Staging   Staging form: Corpus Uteri - Carcinoma and Carcinosarcoma, AJCC 8th Edition - Clinical stage from 08/15/2019: FIGO Stage II (cT2, cN0, cM0) - Signed by Lafonda Mosses, MD on 08/23/2019   09/18/2019 Imaging   Status post recent hysterectomy and bilateral salpingo-oophorectomy. Loculated fluid collections along the lateral pelvic sidewall may represent postoperative seroma or lymphocele. Superimposed infection or abscess are not excluded. Clinical correlation is recommended.   09/18/2019 Genetic Testing   PNegative genetic testing:  No pathogenic variants detected on the [facility] Common Hereditary Cancers Panel. The report date is 09/18/2019.  The Common Hereditary Cancers Panel offered by [facility] includes sequencing and/or deletion duplication testing of the following 48  genes: APC, ATM, AXIN2, BARD1, BMPR1A, BRCA1, BRCA2, BRIP1, CDH1, CDK4, CDKN2A (p14ARF), CDKN2A (p16INK4a), CHEK2, CTNNA1, DICER1, EPCAM (Deletion/duplication testing only), GREM1 (promoter region deletion/duplication testing only), KIT, MEN1, MLH1, MSH2, MSH3, MSH6, MUTYH, NBN, NF1, NHTL1, PALB2, PDGFRA, PMS2, POLD1, POLE, PTEN, RAD50, RAD51C, RAD51D, RNF43, SDHB, SDHC, SDHD, SMAD4, SMARCA4. STK11, TP53, TSC1, TSC2, and VHL.  The following genes were evaluated for sequence changes only: SDHA and HOXB13 c.251G>A variant only.    09/20/2019 Procedure   Ultrasound and fluoroscopically guided right internal jugular single lumen power port catheter insertion. Tip in the SVC/RA junction. Catheter ready  for use.   12/18/2019 -  Chemotherapy   The patient had palonosetron (ALOXI) injection 0.25 mg, 0.25 mg, Intravenous,  Once, 0 of 4 cycles CARBOplatin (PARAPLATIN) 550.2 mg in sodium chloride 0.9 % 250 mL chemo infusion, 550.2 mg (100 % of original dose 550.2 mg), Intravenous,  Once, 0 of 4 cycles Dose modification:   (original dose 550.2 mg, Cycle 1) fosaprepitant (EMEND) 150 mg in sodium chloride 0.9 % 145 mL IVPB, 150 mg, Intravenous,  Once, 0 of 4 cycles PACLitaxel (TAXOL) 318 mg in sodium chloride 0.9 % 500 mL chemo infusion (> 32m/m2), 175 mg/m2 = 318 mg, Intravenous,  Once, 0 of 4 cycles  for chemotherapy treatment.      REVIEW OF SYSTEMS:   Constitutional: Denies fevers, chills or abnormal weight loss Eyes: Denies blurriness of vision Ears, nose, mouth, throat, and face: Denies mucositis or sore throat Respiratory: Denies cough, dyspnea or wheezes Cardiovascular: Denies palpitation, chest discomfort or lower extremity swelling Gastrointestinal:  Denies nausea, heartburn or change in bowel habits Skin: Denies abnormal skin rashes Lymphatics: Denies new lymphadenopathy or easy bruising Neurological:Denies numbness, tingling or new weaknesses Behavioral/Psych: Mood is stable, no new changes  All other systems were reviewed with the patient and are negative.  I have reviewed the past medical history, past surgical history, social history and family history with the patient and they are unchanged from previous note.  ALLERGIES:  is allergic to lisinopril and saxenda [liraglutide -weight management].  MEDICATIONS:  Current Outpatient Medications  Medication Sig Dispense Refill  . acetaminophen (TYLENOL) 500 MG tablet Take 500 mg by mouth every 6 (six) hours as needed.    .Marland KitchenamLODipine (NORVASC) 2.5 MG tablet Take 2.5 mg by mouth daily.    .Marland Kitchendexamethasone (DECADRON) 4 MG tablet Take 2 tabs at the night before and 2 tab the morning of chemotherapy, every 3 weeks, by mouth 4 tablet 4   . hydrochlorothiazide (HYDRODIURIL) 25 MG tablet Take 25 mg by mouth daily.    .Marland Kitchenlidocaine-prilocaine (EMLA) cream Apply to affected area once 30 g 3  . meloxicam (MOBIC) 15 MG tablet Take 15 mg by mouth daily.    . Multiple Vitamin (MULTIVITAMIN) tablet Take 1 tablet by mouth daily.    . ondansetron (ZOFRAN) 8 MG tablet Take 1 tablet (8 mg total) by mouth 2 (two) times daily as needed. Start on the third day after chemotherapy. 30 tablet 1  . prochlorperazine (COMPAZINE) 10 MG tablet Take 1 tablet (10 mg total) by mouth every 6 (six) hours as needed (Nausea or vomiting). 30 tablet 1  . rosuvastatin (CRESTOR) 20 MG tablet Take 20 mg by mouth daily.     No current facility-administered medications for this visit.    PHYSICAL EXAMINATION: ECOG PERFORMANCE STATUS: 1 - Symptomatic but completely ambulatory  Vitals:   11/02/19 0946  BP: (!) 146/78  Pulse: 81  Resp: 18  Temp: 98.3 F (36.8 C)  SpO2: 100%   Filed Weights   11/02/19 0946  Weight: 269 lb 6.4 oz (122.2 kg)    GENERAL:alert, no distress and comfortable NEURO: alert & oriented x 3 with fluent speech, no focal motor/sensory deficits  LABORATORY DATA:  I have reviewed the data as listed    Component Value Date/Time   NA 141 10/20/2019 0904   K 3.8 10/20/2019 0904   CL 102 10/20/2019 0904   CO2 28 10/20/2019 0904   GLUCOSE 92 10/20/2019 0904   BUN 15 10/20/2019 0904   CREATININE 1.03 (H) 10/20/2019 0904   CALCIUM 9.9 10/20/2019 0904   PROT 7.5 10/20/2019 0904   ALBUMIN 3.7 10/20/2019 0904   AST 18 10/20/2019 0904   ALT 19 10/20/2019 0904   ALKPHOS 78 10/20/2019 0904   BILITOT 0.4 10/20/2019 0904   GFRNONAA 57 (L) 10/20/2019 0904   GFRAA >60 10/20/2019 0904    No results found for: SPEP, UPEP  Lab Results  Component Value Date   WBC 5.0 10/20/2019   NEUTROABS 3.8 10/20/2019   HGB 11.8 (L) 10/20/2019   HCT 37.4 10/20/2019   MCV 88.0 10/20/2019   PLT 171 10/20/2019      Chemistry      Component  Value Date/Time   NA 141 10/20/2019 0904   K 3.8 10/20/2019 0904   CL 102 10/20/2019 0904   CO2 28 10/20/2019 0904   BUN 15 10/20/2019 0904   CREATININE 1.03 (H) 10/20/2019 0904      Component Value Date/Time   CALCIUM 9.9 10/20/2019 0904   ALKPHOS 78 10/20/2019 0904   AST 18 10/20/2019 0904   ALT 19 10/20/2019 0904   BILITOT 0.4 10/20/2019 0904

## 2019-11-02 NOTE — Progress Notes (Signed)
DISCONTINUE OFF PATHWAY REGIMEN - Uterine   Custom Intervention:Cisplatin 35 mg/m2 days 1and 28 + RT:     Cisplatin   **Always confirm dose/schedule in your pharmacy ordering system**  REASON: Other Reason PRIOR TREATMENT: Off Pathway: Cisplatin 35 mg/m2 days 1and 28 + RT TREATMENT RESPONSE: Complete Response (CR)  START ON PATHWAY REGIMEN - Uterine     A cycle is every 21 days:     Paclitaxel      Carboplatin   **Always confirm dose/schedule in your pharmacy ordering system**  Patient Characteristics: Clear Cell, Newly Diagnosed, Postoperative (Pathologic Staging), Postoperative Histology: Clear Cell Therapeutic Status: Newly Diagnosed, Postoperative (Pathologic Staging) AJCC T Category: pT2 AJCC N Category: pN0 AJCC 8 Stage Grouping: II AJCC M Category: cM0 Intent of Therapy: Curative Intent, Discussed with Patient

## 2019-11-02 NOTE — Assessment & Plan Note (Signed)
I plan to use adjusted body surface area for treatment planning

## 2019-11-02 NOTE — Assessment & Plan Note (Signed)
she has mild peripheral neuropathy, likely related to side effects of treatment. It is only mild, not bothering the patient. I will observe for now If it gets worse in the future, I will consider modifying the dose of the treatment  

## 2019-11-02 NOTE — Telephone Encounter (Signed)
Scheduled per 6/17 sch message. Unable to reach pt. Left voicemail - appts added on 8/2 and 8/23.

## 2019-11-02 NOTE — Assessment & Plan Note (Signed)
Overall, she tolerated treatment fairly well except for some very mild nausea and very minor neuropathy affecting her left foot She will continue radiation as scheduled After she completed radiation treatment, I plan to start 4 cycles of carboplatin and paclitaxel every 3 weeks starting the first week of August   We reviewed the NCCN guidelines We discussed the role of chemotherapy. The intent is of curative intent.  We discussed some of the risks, benefits, side-effects of carboplatin & Taxol. Treatment is intravenous, every 3 weeks x 6 cycles  Some of the short term side-effects included, though not limited to, including weight loss, life threatening infections, risk of allergic reactions, need for transfusions of blood products, nausea, vomiting, change in bowel habits, loss of hair, admission to hospital for various reasons, and risks of death.   Long term side-effects are also discussed including risks of infertility, permanent damage to nerve function, hearing loss, chronic fatigue, kidney damage with possibility needing hemodialysis, and rare secondary malignancy including bone marrow disorders.  The patient is aware that the response rates discussed earlier is not guaranteed.  After a long discussion, patient made an informed decision to proceed with the prescribed plan of care.   Patient education material was dispensed. We discussed premedication with dexamethasone before chemotherapy. We discussed the use of cranial prosthesis

## 2019-11-02 NOTE — Assessment & Plan Note (Signed)
She will continue antiemetics as needed

## 2019-11-03 ENCOUNTER — Ambulatory Visit: Payer: 59

## 2019-11-13 ENCOUNTER — Telehealth: Payer: Self-pay | Admitting: *Deleted

## 2019-11-13 NOTE — Progress Notes (Signed)
  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 She will be treated with a 2.5 cm diameter segmented cylinder. This conforms to her anatomy without undue discomfort.  Vaginal brachytherapy procedure node: The patient was brought to the [place] suite. Identity was confirmed. All relevant records and images related to the planned course of therapy were reviewed. The patient freely provided informed written consent to proceed with treatment after reviewing the details related to the planned course of therapy. The consent form was witnessed and verified by the simulation staff. Then, the patient was set-up in a stable reproducible supine position for radiation therapy. Pelvic exam revealed the vaginal cuff to be intact . The patient's custom vaginal cylinder was placed in the proximal vagina. This was affixed to the CT/MR stabilization plate to prevent slippage. Patient tolerated the placement well.  Verification simulation note:  A fiducial marker was placed within the vaginal cylinder. An AP and lateral film was then obtained through the pelvis area. This documented accurate position of the vaginal cylinder for treatment.  HDR BRACHYTHERAPY TREATMENT  The remote afterloading device was affixed to the vaginal cylinder by catheter. Patient then proceeded to undergo her first high-dose-rate treatment directed at the proximal vagina. The patient was prescribed a dose of 6.0 gray to be delivered to the mucosal surface. Treatment length was 3.0 cm. Patient was treated with 1 channel using 7 dwell positions. Treatment time was 292.6 seconds. Iridium  192 was the high-dose-rate source for treatment. The patient tolerated the treatment well. After completion of her therapy, a radiation survey was performed documenting return of the iridium source into the [employer] safe.   PLAN: The patient will return on 11/22/2019 for her second high-dose-rate treatment. ________________________________    Blair Promise, PhD, MD  This document serves as a record of services personally performed by Gery Pray, MD. It was created on his behalf by Clerance Lav, a trained medical scribe. The creation of this record is based on the scribe's personal observations and the provider's statements to them. This document has been checked and approved by the attending provider.

## 2019-11-13 NOTE — Progress Notes (Signed)
Radiation Oncology         [phone] ________________________________  Name: Brittney Lamb MRN: [id number]  Date: 11/14/2019  DOB: [birth year]  Vaginal Brachytherapy Procedure Note  CC: Brittney Right, MD Brittney Gibbs, NP    ICD-10-CM   1. 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  09/26/2019 through 10/31/2019 Site Technique Total Dose (Gy) Dose per Fx (Gy) Completed Fx Beam Energies  Uterus: Uterus IMRT 45/45 1.8 25/25 6X    Narrative: She returns today for vaginal cylinder fitting. She was seen in consultation on 09/11/2019, during which time we discussed five weeks of radiation therapy followed by vaginal brachytherapy, given adequate healing from her surgery. She had undergone a pelvic CT scan on 09/18/2019 that showed loculated fluid collections along the lateral pelvic sidewall that may have represented post-operative seroma or lymphocele. Superimposed infection or abscess could not be excluded. Following this, she underwent CT simulation on 09/19/2019 followed by IMRT with 45 Gy in 25 fractions directed at the uterus from 09/26/2019 - 10/31/2019.  She was last seen by Dr. Alvy Bimler on 11/02/2019. She will begin four cycles of Carboplatin and Paclitaxel in the beginning of August following the completion of her radiation therapy.   On review of systems, she reports some constipation over the weekend but this is resolved. She denies vaginal bleeding hematuria or rectal bleeding.  ALLERGIES: is allergic to lisinopril and saxenda [liraglutide -weight management].  Meds: Current Outpatient Medications  Medication Sig Dispense Refill  . acetaminophen (TYLENOL) 500 MG tablet Take 500 mg by mouth every 6 (six) hours as needed.    Marland Kitchen amLODipine (NORVASC) 2.5 MG tablet Take 2.5 mg by mouth daily.    Marland Kitchen dexamethasone  (DECADRON) 4 MG tablet Take 2 tabs at the night before and 2 tab the morning of chemotherapy, every 3 weeks, by mouth 4 tablet 4  . hydrochlorothiazide (HYDRODIURIL) 25 MG tablet Take 25 mg by mouth daily.    Marland Kitchen lidocaine-prilocaine (EMLA) cream Apply to affected area once 30 g 3  . meloxicam (MOBIC) 15 MG tablet Take 15 mg by mouth daily.    . Multiple Vitamin (MULTIVITAMIN) tablet Take 1 tablet by mouth daily.    . ondansetron (ZOFRAN) 8 MG tablet Take 1 tablet (8 mg total) by mouth 2 (two) times daily as needed. Start on the third day after chemotherapy. 30 tablet 1  . prochlorperazine (COMPAZINE) 10 MG tablet Take 1 tablet (10 mg total) by mouth every 6 (six) hours as needed (Nausea or vomiting). 30 tablet 1  . rosuvastatin (CRESTOR) 20 MG tablet Take 20 mg by mouth daily.     No current facility-administered medications for this encounter.    Physical Findings: The patient is in no acute distress. Patient is alert and oriented.  height is 5\' 1"  (1.549 m) and weight is 270 lb (122.5 kg). Her temporal temperature is 97.7 F (36.5 C). Her blood pressure is 154/76 (abnormal) and her pulse is 82. Her respiration is 18 and oxygen saturation is 100%.   No palpable cervical, supraclavicular or axillary lymphoadenopathy. The heart has a regular rate and rhythm. The lungs are clear to auscultation. Abdomen soft and non-tender.  On pelvic examination the external genitalia were unremarkable. A speculum exam was performed. Vaginal cuff intact, no mucosal lesions.  Some erythema to the mucosa consistent with external beam  radiation effect. on bimanual exam there were no pelvic masses appreciated.  Lab Findings: Lab Results  Component Value Date   WBC 5.0 10/20/2019   HGB 11.8 (L) 10/20/2019   HCT 37.4 10/20/2019   MCV 88.0 10/20/2019   PLT 171 10/20/2019    Radiographic Findings: No results found.  Impression: FIGO stage II (pT2, cN0, cM0) high-grade invasive clear cell adenocarcinoma of the  endometrium  Patient was fitted for a vaginal cylinder. The patient will be treated with a 2.5 cm diameter cylinder with a treatment length of 3.0 cm. This distended the vaginal vault without undue discomfort. The patient tolerated the procedure well.  The patient was successfully fitted for a vaginal cylinder. The patient is appropriate to begin vaginal brachytherapy.   Plan: The patient will proceed with CT simulation and vaginal brachytherapy today.      _______________________________   Blair Promise, PhD, MD  This document serves as a record of services personally performed by Gery Pray, MD. It was created on his behalf by Clerance Lav, a trained medical scribe. The creation of this record is based on the scribe's personal observations and the provider's statements to them. This document has been checked and approved by the attending provider.

## 2019-11-13 NOTE — Progress Notes (Signed)
  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  Identity was confirmed.  All relevant records and images related to the planned course of therapy were reviewed.  The patient freely provided informed written consent to proceed with treatment after reviewing the details related to the planned course of therapy. The consent form was witnessed and verified by the simulation staff.  Then, the patient was set-up in a stable reproducible  supine position for radiation therapy.  CT images were obtained.  Surface markings were placed.  The CT images were loaded into the planning software.  Then the target and avoidance structures were contoured.  Treatment planning then occurred.  The radiation prescription was entered and confirmed.   I have requested : Brachytherapy Isodose Plan and Dosimetry Calculations to plan the radiation distribution.    PLAN:  The patient will receive 18 Gy in 3 fractions.  Patient will be treated with a 2.5 cm diameter cylinder with a treatment length of 3 cm.  Iridium 192 will be the high-dose-rate source.    ________________________________  Blair Promise, PhD, MD  This document serves as a record of services personally performed by Gery Pray, MD. It was created on his behalf by Clerance Lav, a trained medical scribe. The creation of this record is based on the scribe's personal observations and the provider's statements to them. This document has been checked and approved by the attending provider.

## 2019-11-13 NOTE — Telephone Encounter (Signed)
CALLED PATIENT TO REMIND OF NEW HDR [facility] FOR 11-14-19, SPOKE WITH PATIENT AND SHE IS AWARE OF THESE APPTS.

## 2019-11-14 ENCOUNTER — Encounter: Payer: Self-pay | Admitting: Radiation Oncology

## 2019-11-14 ENCOUNTER — Ambulatory Visit: Payer: 59 | Admitting: Radiation Oncology

## 2019-11-14 ENCOUNTER — Ambulatory Visit
Admission: RE | Admit: 2019-11-14 | Discharge: 2019-11-14 | Disposition: A | Discharge status: Home / Self Care | Payer: 59 | Source: Ambulatory Visit | Attending: Radiation Oncology | Admitting: Radiation Oncology

## 2019-11-14 ENCOUNTER — Other Ambulatory Visit: Payer: Self-pay

## 2019-11-14 VITALS — BP 154/76 | HR 82 | Temp 97.7°F | Resp 18 | Ht 61.0 in | Wt 270.0 lb

## 2019-11-14 DIAGNOSIS — C541 Malignant neoplasm of endometrium: Secondary | ICD-10-CM | POA: Diagnosis not present

## 2019-11-14 NOTE — Progress Notes (Signed)
Patient here for HDR [facility] visit with Dr. Sondra Come. Patient denies pain or bleeding today.  T 97.7 P 82 R 18 Sat 100% BP 154/76  Wt Readings from Last 3 Encounters:  11/14/19 270 lb (122.5 kg)  11/02/19 269 lb 6.4 oz (122.2 kg)  10/06/19 270 lb (122.5 kg)

## 2019-11-21 ENCOUNTER — Telehealth: Payer: Self-pay | Admitting: *Deleted

## 2019-11-21 NOTE — Telephone Encounter (Signed)
Called patient to remind of HDR Tx. for 11-22-19 @ 2 pm, spoke with patient and she is aware of this tx.

## 2019-11-22 ENCOUNTER — Other Ambulatory Visit: Payer: Self-pay

## 2019-11-22 ENCOUNTER — Ambulatory Visit
Admission: RE | Admit: 2019-11-22 | Discharge: 2019-11-22 | Disposition: A | Discharge status: Home / Self Care | Payer: 59 | Source: Ambulatory Visit | Attending: Gynecologic Oncology | Admitting: Gynecologic Oncology

## 2019-11-22 DIAGNOSIS — C541 Malignant neoplasm of endometrium: Secondary | ICD-10-CM | POA: Diagnosis present

## 2019-11-22 NOTE — Progress Notes (Signed)
  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 She will be treated with a 2.5 cm diameter segmented cylinder. This conforms to her anatomy without undue discomfort.  Vaginal brachytherapy procedure node: The patient was brought to the [place] suite. Identity was confirmed. All relevant records and images related to the planned course of therapy were reviewed. The patient freely provided informed written consent to proceed with treatment after reviewing the details related to the planned course of therapy. The consent form was witnessed and verified by the simulation staff. Then, the patient was set-up in a stable reproducible supine position for radiation therapy. Pelvic exam revealed the vaginal cuff to be intact . The patient's custom vaginal cylinder was placed in the proximal vagina. This was affixed to the CT/MR stabilization plate to prevent slippage. Patient tolerated the placement well.  Verification simulation note:  A fiducial marker was placed within the vaginal cylinder. An AP and lateral film was then obtained through the pelvis area. This documented accurate position of the vaginal cylinder for treatment.  HDR BRACHYTHERAPY TREATMENT  The remote afterloading device was affixed to the vaginal cylinder by catheter. Patient then proceeded to undergo her second high-dose-rate treatment directed at the proximal vagina. The patient was prescribed a dose of 6.0 gray to be delivered to the mucosal surface. Treatment length was 3.0 cm. Patient was treated with 1 channel using 7 dwell positions. Treatment time was 315.5 seconds. Iridium  192 was the high-dose-rate source for treatment. The patient tolerated the treatment well. After completion of her therapy, a radiation survey was performed documenting return of the iridium source into the GammaMed safe.   PLAN: The patient will return in five days for her third high-dose-rate treatment. ________________________________    Blair Promise, PhD, MD  This document serves as a record of services personally performed by Gery Pray, MD. It was created on his behalf by Clerance Lav, a trained medical scribe. The creation of this record is based on the scribe's personal observations and the provider's statements to them. This document has been checked and approved by the attending provider.

## 2019-11-24 ENCOUNTER — Telehealth: Payer: Self-pay | Admitting: *Deleted

## 2019-11-24 NOTE — Telephone Encounter (Signed)
CALLED PATIENT TO REMIND OF HDR [place] 11-27-19 @ 8 AM, SPOKE WITH PATIENT AND SHE IS AWARE OF THIS [place].

## 2019-11-26 NOTE — Progress Notes (Signed)
  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 She will be treated with a 2.5 cm diameter segmented cylinder. This conforms to her anatomy without undue discomfort.  Vaginal brachytherapy procedure node: The patient was brought to the [place] suite. Identity was confirmed. All relevant records and images related to the planned course of therapy were reviewed. The patient freely provided informed written consent to proceed with treatment after reviewing the details related to the planned course of therapy. The consent form was witnessed and verified by the simulation staff. Then, the patient was set-up in a stable reproducible supine position for radiation therapy. Pelvic exam revealed the vaginal cuff to be intact . The patient's custom vaginal cylinder was placed in the proximal vagina. This was affixed to the CT/MR stabilization plate to prevent slippage. Patient tolerated the placement well.  Verification simulation note:  A fiducial marker was placed within the vaginal cylinder. An AP and lateral film was then obtained through the pelvis area. This documented accurate position of the vaginal cylinder for treatment.  HDR BRACHYTHERAPY TREATMENT  The remote afterloading device was affixed to the vaginal cylinder by catheter. Patient then proceeded to undergo her third high-dose-rate treatment directed at the proximal vagina. The patient was prescribed a dose of 6.0 gray to be delivered to the mucosal surface. Treatment length was 3.0 cm. Patient was treated with 1 channel using 7 dwell positions. Treatment time was 330.7 seconds. Iridium  192 was the high-dose-rate source for treatment. The patient tolerated the treatment well. After completion of her therapy, a radiation survey was performed documenting return of the iridium source into the GammaMed safe.   PLAN: The patient has completed her postop adjuvant treatment consisting of external beam and vaginal brachytherapy. She will return in one month for routine follow-up. ________________________________    Blair Promise, PhD, MD  This document serves as a record of services personally performed by Gery Pray, MD. It was created on his behalf by Clerance Lav, a trained medical scribe. The creation of this record is based on the scribe's personal observations and the provider's statements to them. This document has been checked and approved by the attending provider.

## 2019-11-27 ENCOUNTER — Encounter: Payer: Self-pay | Admitting: Radiation Oncology

## 2019-11-27 ENCOUNTER — Other Ambulatory Visit: Payer: Self-pay

## 2019-11-27 ENCOUNTER — Ambulatory Visit
Admission: RE | Admit: 2019-11-27 | Discharge: 2019-11-27 | Disposition: A | Discharge status: Home / Self Care | Payer: 59 | Source: Ambulatory Visit | Attending: Radiation Oncology | Admitting: Radiation Oncology

## 2019-11-27 DIAGNOSIS — C541 Malignant neoplasm of endometrium: Secondary | ICD-10-CM | POA: Diagnosis not present

## 2019-11-27 NOTE — Progress Notes (Incomplete)
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 She did report a headache, fatigue from walking around, constipation that resolved, and a single brief episode of nausea during the beginning of treatment. She denied dysuria, vaginal bleeding, and rectal bleeding.  During the second week of treatment, the patient experienced upper respiratory symptoms including a frequent productive cough with clear phlegm and increased fatigue. She also reported lower pelvic pain/pressure when coughing. As treatment continued, she denied fever, vaginal bleeding, rectal bleeding, dysuria, hematuria, urinary urgency, skin irritation, and changes in bowel and/or bladder patterns.  Of note, she underwent chemotherapy during her first and fifth week of radiation therapy.  Plan: The patient has completed external beam treatment. She will now proceed with vaginal brachytherapy beginning on 11/14/2019.  ________________________________________________   Blair Promise, PhD, MD  This document serves as a record of services personally performed by Gery Pray, MD. It was created on his behalf by Clerance Lav, a trained medical scribe.  The creation of this record is based on the scribe's personal observations and the provider's statements to them. This document has been checked and approved by the attending provider.

## 2019-11-29 ENCOUNTER — Ambulatory Visit: Payer: 59 | Admitting: Radiation Oncology

## 2019-11-29 NOTE — Progress Notes (Incomplete)
  Patient Name: Brittney Lamb MRN: [id number] DOB: [birth year] Referring Physician: [name] (Profile Not Attached) Date of Service: 11/27/2019 [facility], [place]                                                        End Of Treatment Note  Diagnoses: C54.1-Malignant neoplasm of endometrium  Cancer Staging: FIGO stage II (cT2, cN0, cM0) high-grade invasive clear cell adenocarcinomaof the endometrium  Intent: Curative  Radiation Treatment Dates: 09/26/2019 through 11/27/2019 Site Technique Total Dose (Gy) Dose per Fx (Gy) Completed Fx Beam Energies  Vagina: Pelvis HDR-brachy 18/18 6 3/3 Ir-192  Uterus: Uterus IMRT 45/45 1.8 25/25 6X   Narrative: The patient completed external beam treatment from 09/26/2019 - 10/31/2019. She tolerated it relatively well. She did report a headache, fatigue from walking around, constipation that resolved, and a single brief episode of nausea during the beginning of treatment. She denied dysuria, vaginal bleeding, and rectal bleeding. During the second week of treatment, the patient experienced upper respiratory symptoms including a frequent productive cough with clear phlegm and increased fatigue. She also reported lower pelvic pain/pressure when coughing. As treatment continued, she denied fever, vaginal bleeding, rectal bleeding, dysuria, hematuria, urinary urgency, skin irritation, and changes in bowel and/or bladder patterns.  Following external beam radiation therapy, she completed vaginal brachytherapy from 11/14/2019 - 11/27/2019. She did not report any significant radiation-related side effects during that time.  Plan: The patient will follow-up with radiation oncology in one month.  ________________________________________________   Blair Promise, PhD, MD  This document  serves as a record of services personally performed by Gery Pray, MD. It was created on his behalf by Clerance Lav, a trained medical scribe. The creation of this record is based on the scribe's personal observations and the provider's statements to them. This document has been checked and approved by the attending provider.

## 2019-11-30 ENCOUNTER — Telehealth: Payer: Self-pay | Admitting: Oncology

## 2019-11-30 NOTE — Telephone Encounter (Signed)
[name] called and said she is on her way to [place] for vacation and is wondering if she has any restrictions on what she can do or eat while she is there.  She has finished chemotherapy and radiation.  Advised her that she should avoid large groups of people, practice social distancing and good hand washing.  She does not have any restrictions on what she can eat.

## 2019-12-12 NOTE — Progress Notes (Signed)
Pharmacist Chemotherapy Monitoring - Initial Assessment    Anticipated start date: 12/18/2019   Regimen:  . Are orders appropriate based on the patient's diagnosis, regimen, and cycle? Yes  . Does the plan date match the patient's scheduled date? Yes . Is the sequencing of drugs appropriate? Yes . Are the premedications appropriate for the patient's regimen? Yes . Prior Authorization for treatment is: Approved o If applicable, is the correct biosimilar selected based on the patient's insurance? not applicable  Organ Function and Labs: [place] Kitchen Are dose adjustments needed based on the patient's renal function, hepatic function, or hematologic function? No . Are appropriate labs ordered prior to the start of patient's treatment? Yes . Other organ system assessment, if indicated: N/A . The following baseline labs, if indicated, have been ordered: N/A  Dose Assessment: . Are the drug doses appropriate? Yes - (Per MD note in 10/2019, using adjusted body weight for dose calculations)  . Are the following correct: o Drug concentrations Yes o IV fluid compatible with drug Yes o Administration routes Yes o Timing of therapy Yes . If applicable, does the patient have documented access for treatment and/or plans for port-a-cath placement? yes . If applicable, have lifetime cumulative doses been properly documented and assessed? yes Lifetime Dose Tracking  No doses have been documented on this patient for the following tracked chemicals: Doxorubicin, Epirubicin, Idarubicin, Daunorubicin, Mitoxantrone, Bleomycin, Oxaliplatin, Carboplatin, Liposomal Doxorubicin  o   Toxicity Monitoring/Prevention: . The patient has the following take home antiemetics prescribed: Ondansetron, Prochlorperazine and Dexamethasone . The patient has the following take home medications prescribed: N/A . Medication allergies and previous infusion related reactions, if applicable, have been reviewed and addressed. Yes . The  patient's current medication list has been assessed for drug-drug interactions with their chemotherapy regimen. no significant drug-drug interactions were identified on review though meloxicam may increase renal toxicity. No empiric dose adjustments necessary but will monitor.  Order Review: . Are the treatment plan orders signed? Yes . Is the patient scheduled to see a provider prior to their treatment? Yes  I verify that I have reviewed each item in the above checklist and answered each question accordingly.   Eddie Candle, PharmD PGY-2 Hematology/Oncology Pharmacy Resident  12/12/2019 2:22 PM

## 2019-12-18 ENCOUNTER — Inpatient Hospital Stay: Payer: 59

## 2019-12-18 ENCOUNTER — Encounter: Payer: Self-pay | Admitting: Hematology and Oncology

## 2019-12-18 ENCOUNTER — Other Ambulatory Visit: Payer: Self-pay

## 2019-12-18 ENCOUNTER — Inpatient Hospital Stay: Payer: 59 | Attending: Hematology and Oncology | Admitting: Hematology and Oncology

## 2019-12-18 VITALS — BP 165/85 | HR 80 | Temp 97.9°F | Resp 18

## 2019-12-18 DIAGNOSIS — C541 Malignant neoplasm of endometrium: Secondary | ICD-10-CM | POA: Insufficient documentation

## 2019-12-18 DIAGNOSIS — Z5111 Encounter for antineoplastic chemotherapy: Secondary | ICD-10-CM | POA: Diagnosis present

## 2019-12-18 DIAGNOSIS — G620 Drug-induced polyneuropathy: Secondary | ICD-10-CM

## 2019-12-18 DIAGNOSIS — R739 Hyperglycemia, unspecified: Secondary | ICD-10-CM | POA: Insufficient documentation

## 2019-12-18 DIAGNOSIS — T451X5A Adverse effect of antineoplastic and immunosuppressive drugs, initial encounter: Secondary | ICD-10-CM

## 2019-12-18 DIAGNOSIS — T50905A Adverse effect of unspecified drugs, medicaments and biological substances, initial encounter: Secondary | ICD-10-CM

## 2019-12-18 LAB — CBC WITH DIFFERENTIAL (CANCER CENTER ONLY)
Abs Immature Granulocytes: 0.12 10*3/uL — ABNORMAL HIGH (ref 0.00–0.07)
Basophils Absolute: 0 10*3/uL (ref 0.0–0.1)
Basophils Relative: 0 %
Eosinophils Absolute: 0 10*3/uL (ref 0.0–0.5)
Eosinophils Relative: 0 %
HCT: 35.5 % — ABNORMAL LOW (ref 36.0–46.0)
Hemoglobin: 11.4 g/dL — ABNORMAL LOW (ref 12.0–15.0)
Immature Granulocytes: 2 %
Lymphocytes Relative: 12 %
Lymphs Abs: 0.8 10*3/uL (ref 0.7–4.0)
MCH: 28.3 pg (ref 26.0–34.0)
MCHC: 32.1 g/dL (ref 30.0–36.0)
MCV: 88.1 fL (ref 80.0–100.0)
Monocytes Absolute: 0.1 10*3/uL (ref 0.1–1.0)
Monocytes Relative: 1 %
Neutro Abs: 6.1 10*3/uL (ref 1.7–7.7)
Neutrophils Relative %: 85 %
Platelet Count: 242 10*3/uL (ref 150–400)
RBC: 4.03 MIL/uL (ref 3.87–5.11)
RDW: 16.1 % — ABNORMAL HIGH (ref 11.5–15.5)
WBC Count: 7.2 10*3/uL (ref 4.0–10.5)
nRBC: 0.4 % — ABNORMAL HIGH (ref 0.0–0.2)

## 2019-12-18 LAB — CMP (CANCER CENTER ONLY)
ALT: 20 U/L (ref 0–44)
AST: 18 U/L (ref 15–41)
Albumin: 3.7 g/dL (ref 3.5–5.0)
Alkaline Phosphatase: 81 U/L (ref 38–126)
Anion gap: 11 (ref 5–15)
BUN: 16 mg/dL (ref 8–23)
CO2: 24 mmol/L (ref 22–32)
Calcium: 10.2 mg/dL (ref 8.9–10.3)
Chloride: 104 mmol/L (ref 98–111)
Creatinine: 1.17 mg/dL — ABNORMAL HIGH (ref 0.44–1.00)
GFR, Est AFR Am: 57 mL/min — ABNORMAL LOW (ref >60)
GFR, Estimated: 49 mL/min — ABNORMAL LOW (ref >60)
Glucose, Bld: 236 mg/dL — ABNORMAL HIGH (ref 70–99)
Potassium: 4 mmol/L (ref 3.5–5.1)
Sodium: 139 mmol/L (ref 135–145)
Total Bilirubin: 0.6 mg/dL (ref 0.3–1.2)
Total Protein: 7.8 g/dL (ref 6.5–8.1)

## 2019-12-18 LAB — MAGNESIUM: Magnesium: 1.7 mg/dL (ref 1.7–2.4)

## 2019-12-18 MED ORDER — SODIUM CHLORIDE 0.9 % IV SOLN
Freq: Once | INTRAVENOUS | Status: AC
  Filled 2019-12-18: qty 250

## 2019-12-18 MED ORDER — SODIUM CHLORIDE 0.9% FLUSH
10.0000 mL | Freq: Once | INTRAVENOUS | Status: AC
  Administered 2019-12-18: 10 mL
  Filled 2019-12-18: qty 10

## 2019-12-18 MED ORDER — SODIUM CHLORIDE 0.9 % IV SOLN
10.0000 mg | Freq: Once | INTRAVENOUS | Status: AC
  Administered 2019-12-18: 10 mg via INTRAVENOUS
  Filled 2019-12-18: qty 10

## 2019-12-18 MED ORDER — SODIUM CHLORIDE 0.9 % IV SOLN
150.0000 mg | Freq: Once | INTRAVENOUS | Status: AC
  Administered 2019-12-18: 150 mg via INTRAVENOUS
  Filled 2019-12-18: qty 150

## 2019-12-18 MED ORDER — HEPARIN SOD (PORK) LOCK FLUSH 100 UNIT/ML IV SOLN
500.0000 [IU] | Freq: Once | INTRAVENOUS | Status: AC | PRN
  Administered 2019-12-18: 500 [IU]
  Filled 2019-12-18: qty 5

## 2019-12-18 MED ORDER — SODIUM CHLORIDE 0.9 % IV SOLN
175.0000 mg/m2 | Freq: Once | INTRAVENOUS | Status: AC
  Administered 2019-12-18: 318 mg via INTRAVENOUS
  Filled 2019-12-18: qty 53

## 2019-12-18 MED ORDER — FAMOTIDINE IN NACL 20-0.9 MG/50ML-% IV SOLN
INTRAVENOUS | Status: AC
  Filled 2019-12-18: qty 50

## 2019-12-18 MED ORDER — PALONOSETRON HCL INJECTION 0.25 MG/5ML
INTRAVENOUS | Status: AC
  Filled 2019-12-18: qty 5

## 2019-12-18 MED ORDER — FAMOTIDINE IN NACL 20-0.9 MG/50ML-% IV SOLN
20.0000 mg | Freq: Once | INTRAVENOUS | Status: AC
  Administered 2019-12-18: 20 mg via INTRAVENOUS

## 2019-12-18 MED ORDER — SODIUM CHLORIDE 0.9% FLUSH
10.0000 mL | INTRAVENOUS | Status: DC | PRN
  Administered 2019-12-18: 10 mL
  Filled 2019-12-18: qty 10

## 2019-12-18 MED ORDER — SODIUM CHLORIDE 0.9 % IV SOLN
502.2000 mg | Freq: Once | INTRAVENOUS | Status: AC
  Administered 2019-12-18: 500 mg via INTRAVENOUS
  Filled 2019-12-18: qty 50

## 2019-12-18 MED ORDER — DIPHENHYDRAMINE HCL 50 MG/ML IJ SOLN
INTRAMUSCULAR | Status: AC
  Filled 2019-12-18: qty 1

## 2019-12-18 MED ORDER — PALONOSETRON HCL INJECTION 0.25 MG/5ML
0.2500 mg | Freq: Once | INTRAVENOUS | Status: AC
  Administered 2019-12-18: 0.25 mg via INTRAVENOUS

## 2019-12-18 MED ORDER — DIPHENHYDRAMINE HCL 50 MG/ML IJ SOLN
50.0000 mg | Freq: Once | INTRAMUSCULAR | Status: AC
  Administered 2019-12-18: 50 mg via INTRAVENOUS

## 2019-12-18 NOTE — Patient Instructions (Signed)

## 2019-12-18 NOTE — Assessment & Plan Note (Signed)
This is due to side effects of treatment Observe for now

## 2019-12-18 NOTE — Patient Instructions (Signed)
[facility] Discharge Instructions for Patients Receiving Chemotherapy  Today you received the following chemotherapy agents Paclitaxel, Carboplatin To help prevent nausea and vomiting after your treatment, we encourage you to take your nausea medication as prescribed.   If you develop nausea and vomiting that is not controlled by your nausea medication, call the clinic.   BELOW ARE SYMPTOMS THAT SHOULD BE REPORTED IMMEDIATELY:  *FEVER GREATER THAN 100.5 F  *CHILLS WITH OR WITHOUT FEVER  NAUSEA AND VOMITING THAT IS NOT CONTROLLED WITH YOUR NAUSEA MEDICATION  *UNUSUAL SHORTNESS OF BREATH  *UNUSUAL BRUISING OR BLEEDING  TENDERNESS IN MOUTH AND THROAT WITH OR WITHOUT PRESENCE OF ULCERS  *URINARY PROBLEMS  *BOWEL PROBLEMS  UNUSUAL RASH Items with * indicate a potential emergency and should be followed up as soon as possible.  Feel free to call the clinic should you have any questions or concerns. The clinic phone number is [phone].  Please show the [facility] at check-in to the Emergency Department and triage nurse.  Paclitaxel injection What is this medicine? PACLITAXEL (PAK li TAX el) is a chemotherapy drug. It targets fast dividing cells, like cancer cells, and causes these cells to die. This medicine is used to treat ovarian cancer, breast cancer, lung cancer, Kaposi's sarcoma, and other cancers. This medicine may be used for other purposes; ask your health care provider or pharmacist if you have questions. COMMON BRAND NAME(S): Onxol, Taxol What should I tell my health care provider before I take this medicine? They need to know if you have any of these conditions: history of irregular heartbeat liver disease low blood counts, like low white cell, platelet, or red cell counts lung or breathing disease, like asthma tingling of the fingers or toes, or other nerve disorder an unusual or allergic reaction to paclitaxel, alcohol, polyoxyethylated  castor oil, other chemotherapy, other medicines, foods, dyes, or preservatives pregnant or trying to get pregnant breast-feeding How should I use this medicine? This drug is given as an infusion into a vein. It is administered in a hospital or clinic by a specially trained health care professional. Talk to your pediatrician regarding the use of this medicine in children. Special care may be needed. Overdosage: If you think you have taken too much of this medicine contact a poison control center or emergency room at once. NOTE: This medicine is only for you. Do not share this medicine with others. What if I miss a dose? It is important not to miss your dose. Call your doctor or health care professional if you are unable to keep an appointment. What may interact with this medicine? Do not take this medicine with any of the following medications: disulfiram metronidazole This medicine may also interact with the following medications: antiviral medicines for hepatitis, HIV or AIDS certain antibiotics like erythromycin and clarithromycin certain medicines for fungal infections like ketoconazole and itraconazole certain medicines for seizures like carbamazepine, phenobarbital, phenytoin gemfibrozil nefazodone rifampin St. John's wort This list may not describe all possible interactions. Give your health care provider a list of all the medicines, herbs, non-prescription drugs, or dietary supplements you use. Also tell them if you smoke, drink alcohol, or use illegal drugs. Some items may interact with your medicine. What should I watch for while using this medicine? Your condition will be monitored carefully while you are receiving this medicine. You will need important blood work done while you are taking this medicine. This medicine can cause serious allergic reactions. To reduce your risk  you will need to take other medicine(s) before treatment with this medicine. If you experience allergic  reactions like skin rash, itching or hives, swelling of the face, lips, or tongue, tell your doctor or health care professional right away. In some cases, you may be given additional medicines to help with side effects. Follow all directions for their use. This drug may make you feel generally unwell. This is not uncommon, as chemotherapy can affect healthy cells as well as cancer cells. Report any side effects. Continue your course of treatment even though you feel ill unless your doctor tells you to stop. Call your doctor or health care professional for advice if you get a fever, chills or sore throat, or other symptoms of a cold or flu. Do not treat yourself. This drug decreases your body's ability to fight infections. Try to avoid being around people who are sick. This medicine may increase your risk to bruise or bleed. Call your doctor or health care professional if you notice any unusual bleeding. Be careful brushing and flossing your teeth or using a toothpick because you may get an infection or bleed more easily. If you have any dental work done, tell your dentist you are receiving this medicine. Avoid taking products that contain aspirin, acetaminophen, ibuprofen, naproxen, or ketoprofen unless instructed by your doctor. These medicines may hide a fever. Do not become pregnant while taking this medicine. Women should inform their doctor if they wish to become pregnant or think they might be pregnant. There is a potential for serious side effects to an unborn child. Talk to your health care professional or pharmacist for more information. Do not breast-feed an infant while taking this medicine. Men are advised not to father a child while receiving this medicine. This product may contain alcohol. Ask your pharmacist or healthcare provider if this medicine contains alcohol. Be sure to tell all healthcare providers you are taking this medicine. Certain medicines, like metronidazole and disulfiram, can  cause an unpleasant reaction when taken with alcohol. The reaction includes flushing, headache, nausea, vomiting, sweating, and increased thirst. The reaction can last from 30 minutes to several hours. What side effects may I notice from receiving this medicine? Side effects that you should report to your doctor or health care professional as soon as possible: allergic reactions like skin rash, itching or hives, swelling of the face, lips, or tongue breathing problems changes in vision fast, irregular heartbeat high or low blood pressure mouth sores pain, tingling, numbness in the hands or feet signs of decreased platelets or bleeding - bruising, pinpoint red spots on the skin, black, tarry stools, blood in the urine signs of decreased red blood cells - unusually weak or tired, feeling faint or lightheaded, falls signs of infection - fever or chills, cough, sore throat, pain or difficulty passing urine signs and symptoms of liver injury like dark yellow or brown urine; general ill feeling or flu-like symptoms; light-colored stools; loss of appetite; nausea; right upper belly pain; unusually weak or tired; yellowing of the eyes or skin swelling of the ankles, feet, hands unusually slow heartbeat Side effects that usually do not require medical attention (report to your doctor or health care professional if they continue or are bothersome): diarrhea hair loss loss of appetite muscle or joint pain nausea, vomiting pain, redness, or irritation at site where injected tiredness This list may not describe all possible side effects. Call your doctor for medical advice about side effects. You may report side effects to FDA  at 1-800-FDA-1088. Where should I keep my medicine? This drug is given in a hospital or clinic and will not be stored at home. NOTE: This sheet is a summary. It may not cover all possible information. If you have questions about this medicine, talk to your doctor, pharmacist, or  health care provider.  2020 [employer] Standard (2017-01-05 13:14:55)  Carboplatin injection What is this medicine? CARBOPLATIN (KAR boe pla tin) is a chemotherapy drug. It targets fast dividing cells, like cancer cells, and causes these cells to die. This medicine is used to treat ovarian cancer and many other cancers. This medicine may be used for other purposes; ask your health care provider or pharmacist if you have questions. COMMON BRAND NAME(S): Paraplatin What should I tell my health care provider before I take this medicine? They need to know if you have any of these conditions:  blood disorders  hearing problems  kidney disease  recent or ongoing radiation therapy  an unusual or allergic reaction to carboplatin, cisplatin, other chemotherapy, other medicines, foods, dyes, or preservatives  pregnant or trying to get pregnant  breast-feeding How should I use this medicine? This drug is usually given as an infusion into a vein. It is administered in a hospital or clinic by a specially trained health care professional. Talk to your pediatrician regarding the use of this medicine in children. Special care may be needed. Overdosage: If you think you have taken too much of this medicine contact a poison control center or emergency room at once. NOTE: This medicine is only for you. Do not share this medicine with others. What if I miss a dose? It is important not to miss a dose. Call your doctor or health care professional if you are unable to keep an appointment. What may interact with this medicine?  medicines for seizures  medicines to increase blood counts like filgrastim, pegfilgrastim, sargramostim  some antibiotics like amikacin, gentamicin, neomycin, streptomycin, tobramycin  vaccines Talk to your doctor or health care professional before taking any of these medicines:  acetaminophen  aspirin  ibuprofen  ketoprofen  naproxen This list may not describe  all possible interactions. Give your health care provider a list of all the medicines, herbs, non-prescription drugs, or dietary supplements you use. Also tell them if you smoke, drink alcohol, or use illegal drugs. Some items may interact with your medicine. What should I watch for while using this medicine? Your condition will be monitored carefully while you are receiving this medicine. You will need important blood work done while you are taking this medicine. This drug may make you feel generally unwell. This is not uncommon, as chemotherapy can affect healthy cells as well as cancer cells. Report any side effects. Continue your course of treatment even though you feel ill unless your doctor tells you to stop. In some cases, you may be given additional medicines to help with side effects. Follow all directions for their use. Call your doctor or health care professional for advice if you get a fever, chills or sore throat, or other symptoms of a cold or flu. Do not treat yourself. This drug decreases your body's ability to fight infections. Try to avoid being around people who are sick. This medicine may increase your risk to bruise or bleed. Call your doctor or health care professional if you notice any unusual bleeding. Be careful brushing and flossing your teeth or using a toothpick because you may get an infection or bleed more easily. If you have any  dental work done, tell your dentist you are receiving this medicine. Avoid taking products that contain aspirin, acetaminophen, ibuprofen, naproxen, or ketoprofen unless instructed by your doctor. These medicines may hide a fever. Do not become pregnant while taking this medicine. Women should inform their doctor if they wish to become pregnant or think they might be pregnant. There is a potential for serious side effects to an unborn child. Talk to your health care professional or pharmacist for more information. Do not breast-feed an infant while  taking this medicine. What side effects may I notice from receiving this medicine? Side effects that you should report to your doctor or health care professional as soon as possible:  allergic reactions like skin rash, itching or hives, swelling of the face, lips, or tongue  signs of infection - fever or chills, cough, sore throat, pain or difficulty passing urine  signs of decreased platelets or bleeding - bruising, pinpoint red spots on the skin, black, tarry stools, nosebleeds  signs of decreased red blood cells - unusually weak or tired, fainting spells, lightheadedness  breathing problems  changes in hearing  changes in vision  chest pain  high blood pressure  low blood counts - This drug may decrease the number of white blood cells, red blood cells and platelets. You may be at increased risk for infections and bleeding.  nausea and vomiting  pain, swelling, redness or irritation at the injection site  pain, tingling, numbness in the hands or feet  problems with balance, talking, walking  trouble passing urine or change in the amount of urine Side effects that usually do not require medical attention (report to your doctor or health care professional if they continue or are bothersome):  hair loss  loss of appetite  metallic taste in the mouth or changes in taste This list may not describe all possible side effects. Call your doctor for medical advice about side effects. You may report side effects to FDA at 1-800-FDA-1088. Where should I keep my medicine? This drug is given in a hospital or clinic and will not be stored at home. NOTE: This sheet is a summary. It may not cover all possible information. If you have questions about this medicine, talk to your doctor, pharmacist, or health care provider.  2020 [employer]/Gold Standard (2007-08-09 14:38:05)

## 2019-12-18 NOTE — Progress Notes (Signed)
[facility] OFFICE PROGRESS NOTE  Patient Care Team: [name], MD as PCP - General (Family Medicine) Awanda Mink Craige Cotta, RN as Oncology Nurse Navigator (Oncology)  ASSESSMENT & PLAN:  Endometrial cancer [facility]) She has fully recovered from side effects from recent radiation We will proceed with 4 cycles of carboplatin and Taxol I reminded the patient about potential side effects and she is in agreement to proceed  Hyperglycemia, drug-induced This is due to side effects of treatment Observe for now  Peripheral neuropathy due to chemotherapy [facility]) She has no residual neuropathy from prior chemo We will observe closely for now   No orders of the defined types were placed in this encounter.   All questions were answered. The patient knows to call the clinic with any problems, questions or concerns. The total time spent in the appointment was 20 minutes encounter with patients including review of chart and various tests results, discussions about plan of care and coordination of care plan   Heath Lark, MD 12/18/2019 6:07 PM  INTERVAL HISTORY: Please see below for problem oriented charting. 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 Possible 1.9 cm solid echogenic endometrial mass. In the setting of post-menopausal bleeding, endometrial sampling is indicated to exclude carcinoma. I 2. Enlarged uterus with at least 1 calcified fibroid at the fundus measuring 2.6 cm. 3. Heterogenous fluid within the endometrial canal, can be seen in the setting of cervical  obstruction. 4. Nonvisualized ovaries   06/30/2019 Initial Biopsy   EMB: high grade adenocarcinoma   07/14/2019 Tumor Marker   CA-125: 354   07/21/2019 Imaging   CT A/P: IMPRESSION: 1. No gross extension of uterine carcinoma beyond the myometrium. 2. No pelvic lymphadenopathy. 3. No retroperitoneal periaortic adenopathy. 4. No visceral metastasis or skeletal metastasis. 5. Benign adenoma of the LEFT adrenal gland.  Benign renal cysts.   08/15/2019 Surgery   Robotic-assisted laparoscopic total hysterectomy with bilateral salpingoophorectomy, SLN injection, bilateral pelvic LND, mini-lap for specimen removal Modifier 22: significant obesity causing difficulty visualizing anatomy, increasing OR time    08/15/2019 Pathology Results   A. 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  - The surgical resection margins are negative for carcinoma.  - See oncology table below.   Myometrium: Leiomyomata.  Serosa: Unremarkable.  Bilateral adnexa: Benign ovaries and fallopian tubes with endometriosis.   B. LYMPH NODES, RIGHT PELVIC, RESECTION:  - There is no evidence of carcinoma in 6 of 6 lymph nodes (0/6).   C. LYMPH NODES, LEFT PELVIC, RESECTION:  - There is no evidence of carcinoma in 7 of 7 lymph nodes (0/7).    ONCOLOGY TABLE:   UTERUS, CARCINOMA OR CARCINOSARCOMA   Procedure: Total hysterectomy and bilateral salpingo-oophorectomy  Histologic type: Clear cell adenocarcinoma  Histologic Grade: High-grade  Myometrial invasion:       Depth of invasion: 19.5 mm       Myometrial thickness: 20 mm  Uterine Serosa Involvement: Not definitively identified  Cervical  stromal involvement: Present  Extent of involvement of other organs: Confined to lower uterine segment  Lymphovascular  invasion: Not identified  Regional Lymph Nodes:       Examined:        0 Sentinel                               13 non-sentinel                               13 total        Lymph nodes with metastasis: 0  Representative Tumor Block: A17  MMR / MSI testing: Can be ordered upon clinician request.  Pathologic Stage Classification (pTNM, AJCC 8th edition):  pT2, pN0  (FIGO stage II)  Comments: Dr. Claudette Laws has reviewed selected slides and concurs with  the phenotype of this tumor.  Additional studies can be performed upon  clinician request.    08/15/2019 Cancer Staging   Staging form: Corpus Uteri - Carcinoma and Carcinosarcoma, AJCC 8th Edition - Clinical stage from 08/15/2019: FIGO Stage II (cT2, cN0, cM0) - Signed by Lafonda Mosses, MD on 08/23/2019   09/18/2019 Imaging   Status post recent hysterectomy and bilateral salpingo-oophorectomy. Loculated fluid collections along the lateral pelvic sidewall may represent postoperative seroma or lymphocele. Superimposed infection or abscess are not excluded. Clinical correlation is recommended.   09/18/2019 Genetic Testing   PNegative genetic testing:  No pathogenic variants detected on the [employer] Common Hereditary Cancers Panel. The report date is 09/18/2019.  The Common Hereditary Cancers Panel offered by [facility] includes sequencing and/or deletion duplication testing of the following 48 genes: APC, ATM, AXIN2, BARD1, BMPR1A, BRCA1, BRCA2, BRIP1, CDH1, CDK4, CDKN2A (p14ARF), CDKN2A (p16INK4a), CHEK2, CTNNA1, DICER1, EPCAM (Deletion/duplication testing only), GREM1 (promoter region deletion/duplication testing only), KIT, MEN1, MLH1, MSH2, MSH3, MSH6, MUTYH, NBN, NF1, NHTL1, PALB2, PDGFRA, PMS2, POLD1, POLE, PTEN, RAD50, RAD51C, RAD51D, RNF43, SDHB, SDHC, SDHD, SMAD4, SMARCA4. STK11, TP53, TSC1, TSC2, and VHL.  The following genes were evaluated for sequence changes only: SDHA and HOXB13 c.251G>A variant only.    09/20/2019 Procedure   Ultrasound  and fluoroscopically guided right internal jugular single lumen power port catheter insertion. Tip in the SVC/RA junction. Catheter ready for use.   12/18/2019 -  Chemotherapy   The patient had palonosetron (ALOXI) injection 0.25 mg, 0.25 mg, Intravenous,  Once, 1 of 4 cycles Administration: 0.25 mg (12/18/2019) CARBOplatin (PARAPLATIN) 500 mg in sodium chloride 0.9 % 250 mL chemo infusion, 500 mg (91.3 % of original dose 550.2 mg), Intravenous,  Once, 1 of 4 cycles Dose modification:   (original dose 550.2 mg, Cycle 1) Administration: 500 mg (12/18/2019) fosaprepitant (EMEND) 150 mg in sodium chloride 0.9 % 145 mL IVPB, 150 mg, Intravenous,  Once, 1 of 4 cycles Administration: 150 mg (12/18/2019) PACLitaxel (TAXOL) 318 mg in sodium chloride 0.9 % 500 mL chemo infusion (> 67m/m2), 175 mg/m2 = 318 mg, Intravenous,  Once, 1 of 4 cycles Administration: 318 mg (12/18/2019)  for chemotherapy treatment.      REVIEW OF SYSTEMS:   Constitutional: Denies fevers, chills or abnormal weight loss Eyes: Denies blurriness of vision Ears, nose, mouth, throat, and face: Denies mucositis or sore throat Respiratory: Denies cough, dyspnea or wheezes Cardiovascular: Denies palpitation, chest discomfort or lower extremity swelling Gastrointestinal:  Denies nausea, heartburn or change in bowel habits Skin: Denies abnormal skin rashes  Lymphatics: Denies new lymphadenopathy or easy bruising Neurological:Denies numbness, tingling or new weaknesses Behavioral/Psych: Mood is stable, no new changes  All other systems were reviewed with the patient and are negative.  I have reviewed the past medical history, past surgical history, social history and family history with the patient and they are unchanged from previous note.  ALLERGIES:  is allergic to lisinopril and saxenda [liraglutide -weight management].  MEDICATIONS:  Current Outpatient Medications  Medication Sig Dispense Refill  . acetaminophen (TYLENOL) 500 MG  tablet Take 500 mg by mouth every 6 (six) hours as needed.    Marland Kitchen amLODipine (NORVASC) 2.5 MG tablet Take 2.5 mg by mouth daily.    Marland Kitchen dexamethasone (DECADRON) 4 MG tablet Take 2 tabs at the night before and 2 tab the morning of chemotherapy, every 3 weeks, by mouth 4 tablet 4  . hydrochlorothiazide (HYDRODIURIL) 25 MG tablet Take 25 mg by mouth daily.    Marland Kitchen lidocaine-prilocaine (EMLA) cream Apply to affected area once 30 g 3  . meloxicam (MOBIC) 15 MG tablet Take 15 mg by mouth daily.    . Multiple Vitamin (MULTIVITAMIN) tablet Take 1 tablet by mouth daily.    . ondansetron (ZOFRAN) 8 MG tablet Take 1 tablet (8 mg total) by mouth 2 (two) times daily as needed. Start on the third day after chemotherapy. 30 tablet 1  . prochlorperazine (COMPAZINE) 10 MG tablet Take 1 tablet (10 mg total) by mouth every 6 (six) hours as needed (Nausea or vomiting). 30 tablet 1  . rosuvastatin (CRESTOR) 20 MG tablet Take 20 mg by mouth daily.     No current facility-administered medications for this visit.   Facility-Administered Medications Ordered in Other Visits  Medication Dose Route Frequency Provider Last Rate Last Admin  . sodium chloride flush (NS) 0.9 % injection 10 mL  10 mL Intracatheter PRN Alvy Bimler, Maks Cavallero, MD   10 mL at 12/18/19 1543    PHYSICAL EXAMINATION: ECOG PERFORMANCE STATUS: 0 - Asymptomatic  Vitals:   12/18/19 0823  BP: (!) 156/65  Pulse: 94  Resp: 18  Temp: 98.2 F (36.8 C)  SpO2: 100%   Filed Weights   12/18/19 0823  Weight: (!) 270 lb 12.8 oz (122.8 kg)    GENERAL:alert, no distress and comfortable SKIN: skin color, texture, turgor are normal, no rashes or significant lesions EYES: normal, Conjunctiva are pink and non-injected, sclera clear OROPHARYNX:no exudate, no erythema and lips, buccal mucosa, and tongue normal  NECK: supple, thyroid normal size, non-tender, without nodularity LYMPH:  no palpable lymphadenopathy in the cervical, axillary or inguinal LUNGS: clear to  auscultation and percussion with normal breathing effort HEART: regular rate & rhythm and no murmurs and no lower extremity edema ABDOMEN:abdomen soft, non-tender and normal bowel sounds Musculoskeletal:no cyanosis of digits and no clubbing  NEURO: alert & oriented x 3 with fluent speech, no focal motor/sensory deficits  LABORATORY DATA:  I have reviewed the data as listed    Component Value Date/Time   NA 139 12/18/2019 0806   K 4.0 12/18/2019 0806   CL 104 12/18/2019 0806   CO2 24 12/18/2019 0806   GLUCOSE 236 (H) 12/18/2019 0806   BUN 16 12/18/2019 0806   CREATININE 1.17 (H) 12/18/2019 0806   CALCIUM 10.2 12/18/2019 0806   PROT 7.8 12/18/2019 0806   ALBUMIN 3.7 12/18/2019 0806   AST 18 12/18/2019 0806   ALT 20 12/18/2019 0806   ALKPHOS 81 12/18/2019 0806   BILITOT 0.6 12/18/2019 0806   GFRNONAA  49 (L) 12/18/2019 0806   GFRAA 57 (L) 12/18/2019 0806    No results found for: SPEP, UPEP  Lab Results  Component Value Date   WBC 7.2 12/18/2019   NEUTROABS 6.1 12/18/2019   HGB 11.4 (L) 12/18/2019   HCT 35.5 (L) 12/18/2019   MCV 88.1 12/18/2019   PLT 242 12/18/2019      Chemistry      Component Value Date/Time   NA 139 12/18/2019 0806   K 4.0 12/18/2019 0806   CL 104 12/18/2019 0806   CO2 24 12/18/2019 0806   BUN 16 12/18/2019 0806   CREATININE 1.17 (H) 12/18/2019 0806      Component Value Date/Time   CALCIUM 10.2 12/18/2019 0806   ALKPHOS 81 12/18/2019 0806   AST 18 12/18/2019 0806   ALT 20 12/18/2019 0806   BILITOT 0.6 12/18/2019 0806

## 2019-12-18 NOTE — Assessment & Plan Note (Signed)
She has no residual neuropathy from prior chemo We will observe closely for now 

## 2019-12-18 NOTE — Assessment & Plan Note (Signed)
She has fully recovered from side effects from recent radiation We will proceed with 4 cycles of carboplatin and Taxol I reminded the patient about potential side effects and she is in agreement to proceed

## 2019-12-19 ENCOUNTER — Telehealth: Payer: Self-pay | Admitting: *Deleted

## 2019-12-19 ENCOUNTER — Telehealth: Payer: Self-pay | Admitting: Oncology

## 2019-12-19 NOTE — Telephone Encounter (Signed)
[name] called and said her family doctor has referred her to [facility] on 12/21/2019 for lower back pain that she has had for a few years.  She is wondering if they do any treatments if it would interfere with her chemotherapy.  Advised her that she should review any procedures with Dr. Alvy Bimler.

## 2019-12-21 ENCOUNTER — Telehealth: Payer: Self-pay

## 2019-12-21 ENCOUNTER — Other Ambulatory Visit: Payer: Self-pay | Admitting: Hematology and Oncology

## 2019-12-21 DIAGNOSIS — C541 Malignant neoplasm of endometrium: Secondary | ICD-10-CM

## 2019-12-21 MED ORDER — OXYCODONE HCL 5 MG PO TABS: 5.0000 mg | ORAL_TABLET | ORAL | 0 refills | Status: DC | PRN

## 2019-12-21 NOTE — Telephone Encounter (Signed)
She called and left a message to call her.  Called back. She is aching all over, joint and muscle pain. She is aching so bad she could not sleep last night. She is using a heating pad and took tylenol yesterday but it did not really help. Instructed to take tylenol every 6 hours as needed.  Above message given to Dr. Alvy Bimler. Rx for Oxycodone sent to pharmacy in [place].  Called her back and instructed her to take the Oxycodone as needed for the pain/ aching. She can take the tylenol as needed along with the Oxycodone. There is a risk of constipation and drowsiness. Take Miralax to prevent constipation and she may take it Bid if needed. She verbalized understanding.

## 2019-12-22 ENCOUNTER — Telehealth: Payer: Self-pay | Admitting: Oncology

## 2019-12-22 NOTE — Telephone Encounter (Signed)
Brittney Lamb called and asked if she can eat raw vegetables like tomatoes and onions.  Advised her that she can eat them, she just needs to clean them first.  She verbalized understanding.

## 2020-01-03 NOTE — Progress Notes (Signed)
Radiation Oncology         [phone] ________________________________  Name: [name] L Speranza MRN: [id number]  Date: 01/04/2020  DOB: 06/20/1955  Follow-Up Visit Note  CC: [name], MD  [name], MD    ICD-10-CM   1. 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  Radiation Treatment Dates: 09/26/2019 through 11/27/2019 Site Technique Total Dose (Gy) Dose per Fx (Gy) Completed Fx Beam Energies  Vagina: Pelvis HDR-brachy 18/18 6 3/3 Ir-192  Uterus: Uterus IMRT 45/45 1.8 25/25 6X    Narrative:  The patient returns today for routine follow-up. Since the end of treatment, she followed-up with Dr. Alvy Bimler on 12/18/2019. At that time, she proceeded with cycle #1 of chemotherapy with Carboplatin and Paclitaxel - four cycles are anticipated.   On review of systems, she reports no complaints. She denies pelvic pain, vaginal bleeding, and changes in bowel/bladder patterns.  She denies any nausea or problems with abdominal bloating              ALLERGIES:  is allergic to lisinopril and saxenda [liraglutide -weight management].  Meds: Current Outpatient Medications  Medication Sig Dispense Refill  . acetaminophen (TYLENOL) 500 MG tablet Take 500 mg by mouth every 6 (six) hours as needed.    Marland Kitchen amLODipine (NORVASC) 2.5 MG tablet Take 2.5 mg by mouth daily.    Marland Kitchen dexamethasone (DECADRON) 4 MG tablet Take 2 tabs at the night before and 2 tab the morning of chemotherapy, every 3 weeks, by mouth 4 tablet 4  . hydrochlorothiazide (HYDRODIURIL) 25 MG tablet Take 25 mg by mouth daily.    Marland Kitchen lidocaine-prilocaine (EMLA) cream Apply to affected area once 30 g 3  . meloxicam (MOBIC) 15 MG tablet Take 15 mg by mouth daily.    . Multiple Vitamin (MULTIVITAMIN) tablet Take 1 tablet by mouth daily.    . ondansetron (ZOFRAN) 8 MG tablet Take 1  tablet (8 mg total) by mouth 2 (two) times daily as needed. Start on the third day after chemotherapy. 30 tablet 1  . oxyCODONE (OXY IR/ROXICODONE) 5 MG immediate release tablet Take 1 tablet (5 mg total) by mouth every 4 (four) hours as needed for severe pain. 30 tablet 0  . prochlorperazine (COMPAZINE) 10 MG tablet Take 1 tablet (10 mg total) by mouth every 6 (six) hours as needed (Nausea or vomiting). 30 tablet 1  . rosuvastatin (CRESTOR) 20 MG tablet Take 20 mg by mouth daily.     No current facility-administered medications for this encounter.    Physical Findings: The patient is in no acute distress. Patient is alert and oriented.  height is 5\' 1"  (1.549 m) and weight is 272 lb 2 oz (123.4 kg). Her oral temperature is 98.8 F (37.1 C). Her blood pressure is 145/69 (abnormal) and her pulse is 78. Her respiration is 18 and oxygen saturation is 100%.  No significant changes. Lungs are clear to auscultation bilaterally. Heart has regular rate and rhythm. No palpable cervical, supraclavicular, or axillary adenopathy. Abdomen soft, non-tender, normal bowel sounds. Pelvic exam deferred in light of recent treatment.   Lab Findings: Lab Results  Component Value Date   WBC 7.2 12/18/2019   HGB 11.4 (L) 12/18/2019   HCT 35.5 (L) 12/18/2019   MCV 88.1 12/18/2019   PLT 242 12/18/2019    Radiographic Findings: No results found.  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  Plan: The patient will follow-up with Dr. Alvy Bimler on 01/08/2020 prior to her second cycle of chemotherapy. She will follow-up with radiation oncology in 5 months.  Patient will follow up with Dr. Berline Lopes in 2 months.  Today the patient was given a vaginal dilator and instructions on its  use.    ____________________________________   Blair Promise, PhD, MD  This document serves as a record of services personally performed by Gery Pray, MD. It was created on his behalf by Clerance Lav, a trained medical scribe. The creation of this record is based on the scribe's personal observations and the provider's statements to them. This document has been checked and approved by the attending provider.

## 2020-01-04 ENCOUNTER — Telehealth: Payer: Self-pay | Admitting: *Deleted

## 2020-01-04 ENCOUNTER — Ambulatory Visit
Admission: RE | Admit: 2020-01-04 | Discharge: 2020-01-04 | Disposition: A | Discharge status: Home / Self Care | Payer: 59 | Source: Ambulatory Visit | Attending: Radiation Oncology | Admitting: Radiation Oncology

## 2020-01-04 ENCOUNTER — Other Ambulatory Visit: Payer: Self-pay

## 2020-01-04 ENCOUNTER — Encounter: Payer: Self-pay | Admitting: Radiation Oncology

## 2020-01-04 DIAGNOSIS — Z923 Personal history of irradiation: Secondary | ICD-10-CM | POA: Insufficient documentation

## 2020-01-04 DIAGNOSIS — C541 Malignant neoplasm of endometrium: Secondary | ICD-10-CM | POA: Diagnosis not present

## 2020-01-04 DIAGNOSIS — Z79899 Other long term (current) drug therapy: Secondary | ICD-10-CM | POA: Diagnosis not present

## 2020-01-04 NOTE — Progress Notes (Addendum)
Patient here for a one month f/u visit with Dr. Sondra Come. She denies pain, bleeding, bladder or bowel problems.  BP (!) 145/69 (BP Location: Left Arm, Patient Position: Sitting)   Pulse 78   Temp 98.8 F (37.1 C) (Oral)   Resp 18   Ht 5\' 1"  (1.549 m)   Wt 272 lb 2 oz (123.4 kg)   SpO2 100%   BMI 51.42 kg/m   Wt Readings from Last 3 Encounters:  01/04/20 272 lb 2 oz (123.4 kg)  12/18/19 (!) 270 lb 12.8 oz (122.8 kg)  11/14/19 270 lb (122.5 kg)     Home Care Instructions for the Insertion and Care of Your Vaginal Dilator  Why Do I Need a Vaginal Dilator?  Internal radiation therapy may cause scar tissue to form at the top of your vagina (vaginal cuff).  This may make vaginal examinations difficult in the future. You can prevent scar tissue from forming by using a vaginal dilator (a smooth plastic rod), and/or by having regular sexual intercourse.  If not using the dilator you should be having intercourse two or three times a week.  If you are unable to have intercourse, you should use your vaginal dilator.  You may have some spotting or bleeding from your dilator or intercourse the first few times. You may also have some discomfort. If discomfort occurs with intercourse, you and your partner may need to stop for a while and try again later.  How to Use Your Vaginal Dilator  - Wash the dilator with soap and water before and after each use. - Check the dilator to be sure it is smooth. Do not use the dilator if you find any roughspots. - Coat the dilator with K-Y Jelly, Astroglide, or Replens. Do not use Vaseline, baby oil, or other oil based lubricants. They are not water-soluble and can be irritating to the tissues in the vagina. - Lie on your back with your knees bent and legs apart. - Insert the rounded end of the dilator into your vagina as far as it will go without causing pain or discomfort. - Close your knees and slowly straighten your legs. - Keep the dilator in your  vagina for about 10 to 15 minutes.  Please use 3 times a week, for example: Monday, Wednesday and Friday evenings. [facility] your knees, open your legs, and gently remove the dilator. - Gently cleanse the skin around the vaginal opening. - Wash the dilator after each use. - Patient given an xs+ and a small dilator It is important that you use the dilator routinely until instructed otherwise by your doctor. Above instructions reviewed with patient and she verbalized understanding.

## 2020-01-04 NOTE — Telephone Encounter (Signed)
CALLED PATIENT TO INFORM OF FU WITH DR. Berline Lopes ON 03-05-20 - ARRIVAL TIME- 12:45 PM, LVM FOR A RETURN CALL

## 2020-01-04 NOTE — Telephone Encounter (Signed)
[name] from rad onc called and scheduled the patient for a follow up appt in October. [name] will contact the patient

## 2020-01-08 ENCOUNTER — Inpatient Hospital Stay: Payer: 59

## 2020-01-08 ENCOUNTER — Other Ambulatory Visit: Payer: Self-pay

## 2020-01-08 ENCOUNTER — Telehealth: Payer: Self-pay | Admitting: Oncology

## 2020-01-08 ENCOUNTER — Encounter: Payer: Self-pay | Admitting: Hematology and Oncology

## 2020-01-08 ENCOUNTER — Inpatient Hospital Stay (HOSPITAL_BASED_OUTPATIENT_CLINIC_OR_DEPARTMENT_OTHER): Payer: 59 | Admitting: Hematology and Oncology

## 2020-01-08 DIAGNOSIS — C541 Malignant neoplasm of endometrium: Secondary | ICD-10-CM | POA: Diagnosis not present

## 2020-01-08 DIAGNOSIS — G620 Drug-induced polyneuropathy: Secondary | ICD-10-CM | POA: Diagnosis not present

## 2020-01-08 DIAGNOSIS — R739 Hyperglycemia, unspecified: Secondary | ICD-10-CM

## 2020-01-08 DIAGNOSIS — T50905A Adverse effect of unspecified drugs, medicaments and biological substances, initial encounter: Secondary | ICD-10-CM

## 2020-01-08 DIAGNOSIS — D6481 Anemia due to antineoplastic chemotherapy: Secondary | ICD-10-CM | POA: Diagnosis not present

## 2020-01-08 DIAGNOSIS — T451X5A Adverse effect of antineoplastic and immunosuppressive drugs, initial encounter: Secondary | ICD-10-CM

## 2020-01-08 DIAGNOSIS — Z5111 Encounter for antineoplastic chemotherapy: Secondary | ICD-10-CM | POA: Diagnosis not present

## 2020-01-08 LAB — CBC WITH DIFFERENTIAL (CANCER CENTER ONLY)
Abs Immature Granulocytes: 0.11 10*3/uL — ABNORMAL HIGH (ref 0.00–0.07)
Basophils Absolute: 0 10*3/uL (ref 0.0–0.1)
Basophils Relative: 0 %
Eosinophils Absolute: 0.2 10*3/uL (ref 0.0–0.5)
Eosinophils Relative: 2 %
HCT: 31.2 % — ABNORMAL LOW (ref 36.0–46.0)
Hemoglobin: 10.2 g/dL — ABNORMAL LOW (ref 12.0–15.0)
Immature Granulocytes: 2 %
Lymphocytes Relative: 13 %
Lymphs Abs: 0.9 10*3/uL (ref 0.7–4.0)
MCH: 29.5 pg (ref 26.0–34.0)
MCHC: 32.7 g/dL (ref 30.0–36.0)
MCV: 90.2 fL (ref 80.0–100.0)
Monocytes Absolute: 0.6 10*3/uL (ref 0.1–1.0)
Monocytes Relative: 9 %
Neutro Abs: 5.1 10*3/uL (ref 1.7–7.7)
Neutrophils Relative %: 74 %
Platelet Count: 207 10*3/uL (ref 150–400)
RBC: 3.46 MIL/uL — ABNORMAL LOW (ref 3.87–5.11)
RDW: 15.6 % — ABNORMAL HIGH (ref 11.5–15.5)
WBC Count: 7 10*3/uL (ref 4.0–10.5)
nRBC: 0.6 % — ABNORMAL HIGH (ref 0.0–0.2)

## 2020-01-08 LAB — CMP (CANCER CENTER ONLY)
ALT: 11 U/L (ref 0–44)
AST: 16 U/L (ref 15–41)
Albumin: 3.5 g/dL (ref 3.5–5.0)
Alkaline Phosphatase: 83 U/L (ref 38–126)
Anion gap: 6 (ref 5–15)
BUN: 18 mg/dL (ref 8–23)
CO2: 28 mmol/L (ref 22–32)
Calcium: 9.9 mg/dL (ref 8.9–10.3)
Chloride: 106 mmol/L (ref 98–111)
Creatinine: 1.16 mg/dL — ABNORMAL HIGH (ref 0.44–1.00)
GFR, Est AFR Am: 57 mL/min — ABNORMAL LOW (ref >60)
GFR, Estimated: 49 mL/min — ABNORMAL LOW (ref >60)
Glucose, Bld: 93 mg/dL (ref 70–99)
Potassium: 3.5 mmol/L (ref 3.5–5.1)
Sodium: 140 mmol/L (ref 135–145)
Total Bilirubin: 0.4 mg/dL (ref 0.3–1.2)
Total Protein: 7.3 g/dL (ref 6.5–8.1)

## 2020-01-08 LAB — MAGNESIUM: Magnesium: 1.5 mg/dL — ABNORMAL LOW (ref 1.7–2.4)

## 2020-01-08 MED ORDER — PALONOSETRON HCL INJECTION 0.25 MG/5ML
0.2500 mg | Freq: Once | INTRAVENOUS | Status: AC
  Administered 2020-01-08: 0.25 mg via INTRAVENOUS

## 2020-01-08 MED ORDER — SODIUM CHLORIDE 0.9 % IV SOLN
10.0000 mg | Freq: Once | INTRAVENOUS | Status: AC
  Administered 2020-01-08: 10 mg via INTRAVENOUS
  Filled 2020-01-08: qty 10

## 2020-01-08 MED ORDER — DIPHENHYDRAMINE HCL 50 MG/ML IJ SOLN
50.0000 mg | Freq: Once | INTRAMUSCULAR | Status: AC
  Administered 2020-01-08: 50 mg via INTRAVENOUS

## 2020-01-08 MED ORDER — SODIUM CHLORIDE 0.9 % IV SOLN
500.0000 mg | Freq: Once | INTRAVENOUS | Status: AC
  Administered 2020-01-08: 500 mg via INTRAVENOUS
  Filled 2020-01-08: qty 50

## 2020-01-08 MED ORDER — SODIUM CHLORIDE 0.9 % IV SOLN
175.0000 mg/m2 | Freq: Once | INTRAVENOUS | Status: AC
  Administered 2020-01-08: 318 mg via INTRAVENOUS
  Filled 2020-01-08: qty 53

## 2020-01-08 MED ORDER — PALONOSETRON HCL INJECTION 0.25 MG/5ML
INTRAVENOUS | Status: AC
  Filled 2020-01-08: qty 5

## 2020-01-08 MED ORDER — SODIUM CHLORIDE 0.9 % IV SOLN
150.0000 mg | Freq: Once | INTRAVENOUS | Status: AC
  Administered 2020-01-08: 150 mg via INTRAVENOUS
  Filled 2020-01-08: qty 150

## 2020-01-08 MED ORDER — FAMOTIDINE IN NACL 20-0.9 MG/50ML-% IV SOLN
INTRAVENOUS | Status: AC
  Filled 2020-01-08: qty 50

## 2020-01-08 MED ORDER — SODIUM CHLORIDE 0.9% FLUSH
10.0000 mL | INTRAVENOUS | Status: DC | PRN
  Administered 2020-01-08: 10 mL
  Filled 2020-01-08: qty 10

## 2020-01-08 MED ORDER — FAMOTIDINE IN NACL 20-0.9 MG/50ML-% IV SOLN
20.0000 mg | Freq: Once | INTRAVENOUS | Status: AC
  Administered 2020-01-08: 20 mg via INTRAVENOUS

## 2020-01-08 MED ORDER — SODIUM CHLORIDE 0.9% FLUSH
10.0000 mL | Freq: Once | INTRAVENOUS | Status: AC
  Administered 2020-01-08: 10 mL
  Filled 2020-01-08: qty 10

## 2020-01-08 MED ORDER — HEPARIN SOD (PORK) LOCK FLUSH 100 UNIT/ML IV SOLN
500.0000 [IU] | Freq: Once | INTRAVENOUS | Status: AC | PRN
  Administered 2020-01-08: 500 [IU]
  Filled 2020-01-08: qty 5

## 2020-01-08 MED ORDER — SODIUM CHLORIDE 0.9 % IV SOLN
Freq: Once | INTRAVENOUS | Status: AC
  Filled 2020-01-08: qty 250

## 2020-01-08 MED ORDER — DIPHENHYDRAMINE HCL 50 MG/ML IJ SOLN
INTRAMUSCULAR | Status: AC
  Filled 2020-01-08: qty 1

## 2020-01-08 NOTE — Assessment & Plan Note (Signed)
She has recent hyperglycemia due to dietary changes and steroids We discussed the importance of dietary modification

## 2020-01-08 NOTE — Assessment & Plan Note (Signed)
So far, she tolerated treatment well except for some mild aches and pain, mild anemia and fatigue We will proceed with treatment as scheduled After her fourth treatment, we will stop treatment with plan to repeat CT imaging in November

## 2020-01-08 NOTE — Assessment & Plan Note (Signed)

## 2020-01-08 NOTE — Progress Notes (Signed)
[place] OFFICE PROGRESS NOTE  Patient Care Team: [name], MD as PCP - General (Family Medicine) Awanda Mink Craige Cotta, RN as Oncology Nurse Navigator (Oncology)  ASSESSMENT & PLAN:  Endometrial cancer [facility]) So far, she tolerated treatment well except for some mild aches and pain, mild anemia and fatigue We will proceed with treatment as scheduled After her fourth treatment, we will stop treatment with plan to repeat CT imaging in November  Hyperglycemia, drug-induced She has recent hyperglycemia due to dietary changes and steroids We discussed the importance of dietary modification  Peripheral neuropathy due to chemotherapy [facility]) She has no residual neuropathy from prior chemo We will observe closely for now  Anemia due to antineoplastic chemotherapy This is likely due to recent treatment. The patient denies recent history of bleeding such as epistaxis, hematuria or hematochezia. She is asymptomatic from the anemia. I will observe for now.  She does not require transfusion now. I will continue the chemotherapy at current dose without dosage adjustment.  If the anemia gets progressive worse in the future, I might have to delay her treatment or adjust the chemotherapy dose.    No orders of the defined types were placed in this encounter.   All questions were answered. The patient knows to call the clinic with any problems, questions or concerns. The total time spent in the appointment was 20 minutes encounter with patients including review of chart and various tests results, discussions about plan of care and coordination of care plan   Heath Lark, MD 01/08/2020 10:47 AM  INTERVAL HISTORY: Please see below for problem oriented charting. 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 Possible 1.9 cm solid echogenic endometrial mass. In the setting of post-menopausal bleeding, endometrial sampling is indicated to exclude carcinoma. I 2. Enlarged uterus with at least 1 calcified fibroid at the fundus measuring 2.6 cm. 3. Heterogenous fluid within the endometrial canal, can be seen in the setting of cervical obstruction. 4. Nonvisualized ovaries   06/30/2019 Initial Biopsy   EMB: high grade adenocarcinoma   07/14/2019 Tumor Marker   CA-125: 354   07/21/2019 Imaging   CT A/P: IMPRESSION: 1. No gross extension of uterine carcinoma beyond the myometrium. 2. No pelvic lymphadenopathy. 3. No retroperitoneal periaortic adenopathy. 4. No visceral metastasis or skeletal metastasis. 5. Benign adenoma of the LEFT adrenal gland.  Benign renal cysts.   08/15/2019 Surgery   Robotic-assisted laparoscopic total hysterectomy with bilateral salpingoophorectomy, SLN injection, bilateral pelvic LND, mini-lap for specimen removal Modifier 22: significant obesity causing difficulty visualizing anatomy, increasing OR time    08/15/2019 Pathology Results   A. 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  - The surgical resection margins are negative for carcinoma.  - See oncology table below.   Myometrium: Leiomyomata.  Serosa: Unremarkable.  Bilateral adnexa: Benign ovaries and fallopian tubes with endometriosis.   B. LYMPH NODES, RIGHT PELVIC, RESECTION:  -  There is no evidence of carcinoma in 6 of 6 lymph nodes  (0/6).   C. LYMPH NODES, LEFT PELVIC, RESECTION:  - There is no evidence of carcinoma in 7 of 7 lymph nodes (0/7).    ONCOLOGY TABLE:   UTERUS, CARCINOMA OR CARCINOSARCOMA   Procedure: Total hysterectomy and bilateral salpingo-oophorectomy  Histologic type: Clear cell adenocarcinoma  Histologic Grade: High-grade  Myometrial invasion:       Depth of invasion: 19.5 mm       Myometrial thickness: 20 mm  Uterine Serosa Involvement: Not definitively identified  Cervical stromal involvement: Present  Extent of involvement of other organs: Confined to lower uterine segment  Lymphovascular invasion: Not identified  Regional Lymph Nodes:       Examined:        0 Sentinel                               13 non-sentinel                               13 total        Lymph nodes with metastasis: 0  Representative Tumor Block: A17  MMR / MSI testing: Can be ordered upon clinician request.  Pathologic Stage Classification (pTNM, AJCC 8th edition):  pT2, pN0  (FIGO stage II)  Comments: Dr. Claudette Laws has reviewed selected slides and concurs with  the phenotype of this tumor.  Additional studies can be performed upon  clinician request.    08/15/2019 Cancer Staging   Staging form: Corpus Uteri - Carcinoma and Carcinosarcoma, AJCC 8th Edition - Clinical stage from 08/15/2019: FIGO Stage II (cT2, cN0, cM0) - Signed by Lafonda Mosses, MD on 08/23/2019   09/18/2019 Imaging   Status post recent hysterectomy and bilateral salpingo-oophorectomy. Loculated fluid collections along the lateral pelvic sidewall may represent postoperative seroma or lymphocele. Superimposed infection or abscess are not excluded. Clinical correlation is recommended.   09/18/2019 Genetic Testing   PNegative genetic testing:  No pathogenic variants detected on the [employer] Common Hereditary Cancers Panel. The report date is 09/18/2019.  The Common Hereditary Cancers Panel offered by [employer] includes sequencing and/or deletion  duplication testing of the following 48 genes: APC, ATM, AXIN2, BARD1, BMPR1A, BRCA1, BRCA2, BRIP1, CDH1, CDK4, CDKN2A (p14ARF), CDKN2A (p16INK4a), CHEK2, CTNNA1, DICER1, EPCAM (Deletion/duplication testing only), GREM1 (promoter region deletion/duplication testing only), KIT, MEN1, MLH1, MSH2, MSH3, MSH6, MUTYH, NBN, NF1, NHTL1, PALB2, PDGFRA, PMS2, POLD1, POLE, PTEN, RAD50, RAD51C, RAD51D, RNF43, SDHB, SDHC, SDHD, SMAD4, SMARCA4. STK11, TP53, TSC1, TSC2, and VHL.  The following genes were evaluated for sequence changes only: SDHA and HOXB13 c.251G>A variant only.    09/20/2019 Procedure   Ultrasound and fluoroscopically guided right internal jugular single lumen power port catheter insertion. Tip in the SVC/RA junction. Catheter ready for use.   12/18/2019 -  Chemotherapy   The patient had palonosetron (ALOXI) injection 0.25 mg, 0.25 mg, Intravenous,  Once, 1 of 4 cycles Administration: 0.25 mg (12/18/2019) CARBOplatin (PARAPLATIN) 500 mg in sodium chloride 0.9 % 250 mL chemo infusion, 500 mg (91.3 % of original dose 550.2 mg), Intravenous,  Once, 1 of 4 cycles Dose modification:   (original dose 550.2 mg, Cycle 1) Administration: 500 mg (12/18/2019) fosaprepitant (EMEND) 150 mg in sodium chloride 0.9 % 145 mL IVPB, 150 mg, Intravenous,  Once, 1 of 4 cycles Administration: 150 mg (12/18/2019) PACLitaxel (  TAXOL) 318 mg in sodium chloride 0.9 % 500 mL chemo infusion (> 24m/m2), 175 mg/m2 = 318 mg, Intravenous,  Once, 1 of 4 cycles Administration: 318 mg (12/18/2019)  for chemotherapy treatment.      REVIEW OF SYSTEMS:   Constitutional: Denies fevers, chills or abnormal weight loss Eyes: Denies blurriness of vision Ears, nose, mouth, throat, and face: Denies mucositis or sore throat Respiratory: Denies cough, dyspnea or wheezes Cardiovascular: Denies palpitation, chest discomfort or lower extremity swelling Gastrointestinal:  Denies nausea, heartburn or change in bowel habits Skin: Denies abnormal  skin rashes Lymphatics: Denies new lymphadenopathy or easy bruising Neurological:Denies numbness, tingling or new weaknesses Behavioral/Psych: Mood is stable, no new changes  All other systems were reviewed with the patient and are negative.  I have reviewed the past medical history, past surgical history, social history and family history with the patient and they are unchanged from previous note.  ALLERGIES:  is allergic to lisinopril and saxenda [liraglutide -weight management].  MEDICATIONS:  Current Outpatient Medications  Medication Sig Dispense Refill  . acetaminophen (TYLENOL) 500 MG tablet Take 500 mg by mouth every 6 (six) hours as needed.    .Marland KitchenamLODipine (NORVASC) 2.5 MG tablet Take 2.5 mg by mouth daily.    .Marland Kitchendexamethasone (DECADRON) 4 MG tablet Take 2 tabs at the night before and 2 tab the morning of chemotherapy, every 3 weeks, by mouth 4 tablet 4  . hydrochlorothiazide (HYDRODIURIL) 25 MG tablet Take 25 mg by mouth daily.    .Marland Kitchenlidocaine-prilocaine (EMLA) cream Apply to affected area once 30 g 3  . meloxicam (MOBIC) 15 MG tablet Take 15 mg by mouth daily.    . Multiple Vitamin (MULTIVITAMIN) tablet Take 1 tablet by mouth daily.    . ondansetron (ZOFRAN) 8 MG tablet Take 1 tablet (8 mg total) by mouth 2 (two) times daily as needed. Start on the third day after chemotherapy. 30 tablet 1  . oxyCODONE (OXY IR/ROXICODONE) 5 MG immediate release tablet Take 1 tablet (5 mg total) by mouth every 4 (four) hours as needed for severe pain. 30 tablet 0  . prochlorperazine (COMPAZINE) 10 MG tablet Take 1 tablet (10 mg total) by mouth every 6 (six) hours as needed (Nausea or vomiting). 30 tablet 1  . rosuvastatin (CRESTOR) 20 MG tablet Take 20 mg by mouth daily.     No current facility-administered medications for this visit.    PHYSICAL EXAMINATION: ECOG PERFORMANCE STATUS: 1 - Symptomatic but completely ambulatory  Vitals:   01/08/20 1038  BP: 132/68  Pulse: 76  Resp: 18  Temp:  97.7 F (36.5 C)  SpO2: 100%   Filed Weights   01/08/20 1038  Weight: 273 lb 12.8 oz (124.2 kg)    GENERAL:alert, no distress and comfortable SKIN: skin color, texture, turgor are normal, no rashes or significant lesions EYES: normal, Conjunctiva are pink and non-injected, sclera clear OROPHARYNX:no exudate, no erythema and lips, buccal mucosa, and tongue normal  NECK: supple, thyroid normal size, non-tender, without nodularity LYMPH:  no palpable lymphadenopathy in the cervical, axillary or inguinal LUNGS: clear to auscultation and percussion with normal breathing effort HEART: regular rate & rhythm and no murmurs and no lower extremity edema ABDOMEN:abdomen soft, non-tender and normal bowel sounds Musculoskeletal:no cyanosis of digits and no clubbing  NEURO: alert & oriented x 3 with fluent speech, no focal motor/sensory deficits  LABORATORY DATA:  I have reviewed the data as listed    Component Value Date/Time   NA 139  12/18/2019 0806   K 4.0 12/18/2019 0806   CL 104 12/18/2019 0806   CO2 24 12/18/2019 0806   GLUCOSE 236 (H) 12/18/2019 0806   BUN 16 12/18/2019 0806   CREATININE 1.17 (H) 12/18/2019 0806   CALCIUM 10.2 12/18/2019 0806   PROT 7.8 12/18/2019 0806   ALBUMIN 3.7 12/18/2019 0806   AST 18 12/18/2019 0806   ALT 20 12/18/2019 0806   ALKPHOS 81 12/18/2019 0806   BILITOT 0.6 12/18/2019 0806   GFRNONAA 49 (L) 12/18/2019 0806   GFRAA 57 (L) 12/18/2019 0806    No results found for: SPEP, UPEP  Lab Results  Component Value Date   WBC 7.0 01/08/2020   NEUTROABS 5.1 01/08/2020   HGB 10.2 (L) 01/08/2020   HCT 31.2 (L) 01/08/2020   MCV 90.2 01/08/2020   PLT 207 01/08/2020      Chemistry      Component Value Date/Time   NA 139 12/18/2019 0806   K 4.0 12/18/2019 0806   CL 104 12/18/2019 0806   CO2 24 12/18/2019 0806   BUN 16 12/18/2019 0806   CREATININE 1.17 (H) 12/18/2019 0806      Component Value Date/Time   CALCIUM 10.2 12/18/2019 0806   ALKPHOS 81  12/18/2019 0806   AST 18 12/18/2019 0806   ALT 20 12/18/2019 0806   BILITOT 0.6 12/18/2019 0806

## 2020-01-08 NOTE — Telephone Encounter (Signed)
Called [name] and advised her of message from Dr. Alvy Bimler and to keep her appointments today.  She verbalized agreement.

## 2020-01-08 NOTE — Patient Instructions (Signed)
[facility] Discharge Instructions for Patients Receiving Chemotherapy  Today you received the following chemotherapy agents: Paclitaxel and Carboplatin  To help prevent nausea and vomiting after your treatment, we encourage you to take your nausea medication as prescribed.    If you develop nausea and vomiting that is not controlled by your nausea medication, call the clinic.   BELOW ARE SYMPTOMS THAT SHOULD BE REPORTED IMMEDIATELY:  *FEVER GREATER THAN 100.5 F  *CHILLS WITH OR WITHOUT FEVER  NAUSEA AND VOMITING THAT IS NOT CONTROLLED WITH YOUR NAUSEA MEDICATION  *UNUSUAL SHORTNESS OF BREATH  *UNUSUAL BRUISING OR BLEEDING  TENDERNESS IN MOUTH AND THROAT WITH OR WITHOUT PRESENCE OF ULCERS  *URINARY PROBLEMS  *BOWEL PROBLEMS  UNUSUAL RASH Items with * indicate a potential emergency and should be followed up as soon as possible.  Feel free to call the clinic should you have any questions or concerns. The clinic phone number is [phone].  Please show the CHEMO ALERT CARD at check-in to the Emergency Department and triage nurse.   

## 2020-01-08 NOTE — Assessment & Plan Note (Signed)
She has no residual neuropathy from prior chemo We will observe closely for now

## 2020-01-08 NOTE — Telephone Encounter (Signed)
Don't worry about it

## 2020-01-08 NOTE — Telephone Encounter (Signed)
Brittney Lamb left a message that she was not able to take decadron before chemotherapy.  She was not able to pick it up at the [facility] yesterday because it was closed.  She is not sure what to do.

## 2020-01-29 ENCOUNTER — Inpatient Hospital Stay: Payer: Self-pay | Attending: Hematology and Oncology | Admitting: Hematology and Oncology

## 2020-01-29 ENCOUNTER — Inpatient Hospital Stay: Payer: Self-pay

## 2020-01-29 ENCOUNTER — Encounter: Payer: Self-pay | Admitting: Hematology and Oncology

## 2020-01-29 ENCOUNTER — Other Ambulatory Visit: Payer: Self-pay | Admitting: Hematology and Oncology

## 2020-01-29 ENCOUNTER — Other Ambulatory Visit: Payer: Self-pay

## 2020-01-29 DIAGNOSIS — Z23 Encounter for immunization: Secondary | ICD-10-CM | POA: Insufficient documentation

## 2020-01-29 DIAGNOSIS — C541 Malignant neoplasm of endometrium: Secondary | ICD-10-CM

## 2020-01-29 DIAGNOSIS — T451X5A Adverse effect of antineoplastic and immunosuppressive drugs, initial encounter: Secondary | ICD-10-CM

## 2020-01-29 DIAGNOSIS — Z95828 Presence of other vascular implants and grafts: Secondary | ICD-10-CM

## 2020-01-29 DIAGNOSIS — G620 Drug-induced polyneuropathy: Secondary | ICD-10-CM

## 2020-01-29 DIAGNOSIS — Z5111 Encounter for antineoplastic chemotherapy: Secondary | ICD-10-CM | POA: Insufficient documentation

## 2020-01-29 DIAGNOSIS — D6481 Anemia due to antineoplastic chemotherapy: Secondary | ICD-10-CM

## 2020-01-29 LAB — CBC WITH DIFFERENTIAL (CANCER CENTER ONLY)
Abs Immature Granulocytes: 0.18 10*3/uL — ABNORMAL HIGH (ref 0.00–0.07)
Basophils Absolute: 0 10*3/uL (ref 0.0–0.1)
Basophils Relative: 0 %
Eosinophils Absolute: 0 10*3/uL (ref 0.0–0.5)
Eosinophils Relative: 0 %
HCT: 34.2 % — ABNORMAL LOW (ref 36.0–46.0)
Hemoglobin: 11.2 g/dL — ABNORMAL LOW (ref 12.0–15.0)
Immature Granulocytes: 2 %
Lymphocytes Relative: 9 %
Lymphs Abs: 0.7 10*3/uL (ref 0.7–4.0)
MCH: 29.3 pg (ref 26.0–34.0)
MCHC: 32.7 g/dL (ref 30.0–36.0)
MCV: 89.5 fL (ref 80.0–100.0)
Monocytes Absolute: 0.2 10*3/uL (ref 0.1–1.0)
Monocytes Relative: 2 %
Neutro Abs: 6.4 10*3/uL (ref 1.7–7.7)
Neutrophils Relative %: 87 %
Platelet Count: 226 10*3/uL (ref 150–400)
RBC: 3.82 MIL/uL — ABNORMAL LOW (ref 3.87–5.11)
RDW: 14.6 % (ref 11.5–15.5)
WBC Count: 7.4 10*3/uL (ref 4.0–10.5)
nRBC: 0.4 % — ABNORMAL HIGH (ref 0.0–0.2)

## 2020-01-29 LAB — CMP (CANCER CENTER ONLY)
ALT: 17 U/L (ref 0–44)
AST: 21 U/L (ref 15–41)
Albumin: 3.8 g/dL (ref 3.5–5.0)
Alkaline Phosphatase: 98 U/L (ref 38–126)
Anion gap: 7 (ref 5–15)
BUN: 15 mg/dL (ref 8–23)
CO2: 27 mmol/L (ref 22–32)
Calcium: 10.4 mg/dL — ABNORMAL HIGH (ref 8.9–10.3)
Chloride: 105 mmol/L (ref 98–111)
Creatinine: 1.12 mg/dL — ABNORMAL HIGH (ref 0.44–1.00)
GFR, Est AFR Am: 60 mL/min — ABNORMAL LOW (ref >60)
GFR, Estimated: 52 mL/min — ABNORMAL LOW (ref >60)
Glucose, Bld: 144 mg/dL — ABNORMAL HIGH (ref 70–99)
Potassium: 4.2 mmol/L (ref 3.5–5.1)
Sodium: 139 mmol/L (ref 135–145)
Total Bilirubin: 0.5 mg/dL (ref 0.3–1.2)
Total Protein: 8.3 g/dL — ABNORMAL HIGH (ref 6.5–8.1)

## 2020-01-29 LAB — MAGNESIUM: Magnesium: 1.4 mg/dL — CL (ref 1.7–2.4)

## 2020-01-29 MED ORDER — FAMOTIDINE IN NACL 20-0.9 MG/50ML-% IV SOLN
INTRAVENOUS | Status: AC
  Filled 2020-01-29: qty 50

## 2020-01-29 MED ORDER — MAGNESIUM OXIDE 400 (241.3 MG) MG PO TABS: 400.0000 mg | ORAL_TABLET | Freq: Every day | ORAL | 1 refills | Status: DC

## 2020-01-29 MED ORDER — DIPHENHYDRAMINE HCL 50 MG/ML IJ SOLN
INTRAMUSCULAR | Status: AC
  Filled 2020-01-29: qty 1

## 2020-01-29 MED ORDER — PALONOSETRON HCL INJECTION 0.25 MG/5ML
INTRAVENOUS | Status: AC
  Filled 2020-01-29: qty 5

## 2020-01-29 MED ORDER — PALONOSETRON HCL INJECTION 0.25 MG/5ML
0.2500 mg | Freq: Once | INTRAVENOUS | Status: AC
  Administered 2020-01-29: 0.25 mg via INTRAVENOUS

## 2020-01-29 MED ORDER — SODIUM CHLORIDE 0.9 % IV SOLN
10.0000 mg | Freq: Once | INTRAVENOUS | Status: AC
  Administered 2020-01-29: 10 mg via INTRAVENOUS
  Filled 2020-01-29: qty 10

## 2020-01-29 MED ORDER — DIPHENHYDRAMINE HCL 50 MG/ML IJ SOLN
50.0000 mg | Freq: Once | INTRAMUSCULAR | Status: AC
  Administered 2020-01-29: 50 mg via INTRAVENOUS

## 2020-01-29 MED ORDER — SODIUM CHLORIDE 0.9 % IV SOLN
500.0000 mg | Freq: Once | INTRAVENOUS | Status: AC
  Administered 2020-01-29: 500 mg via INTRAVENOUS
  Filled 2020-01-29: qty 50

## 2020-01-29 MED ORDER — SODIUM CHLORIDE 0.9% FLUSH
10.0000 mL | Freq: Once | INTRAVENOUS | Status: AC
  Administered 2020-01-29: 10 mL via INTRAVENOUS
  Filled 2020-01-29: qty 10

## 2020-01-29 MED ORDER — HEPARIN SOD (PORK) LOCK FLUSH 100 UNIT/ML IV SOLN
500.0000 [IU] | Freq: Once | INTRAVENOUS | Status: AC | PRN
  Administered 2020-01-29: 500 [IU]
  Filled 2020-01-29: qty 5

## 2020-01-29 MED ORDER — SODIUM CHLORIDE 0.9 % IV SOLN
Freq: Once | INTRAVENOUS | Status: AC
  Filled 2020-01-29: qty 250

## 2020-01-29 MED ORDER — SODIUM CHLORIDE 0.9 % IV SOLN
150.0000 mg | Freq: Once | INTRAVENOUS | Status: AC
  Administered 2020-01-29: 150 mg via INTRAVENOUS
  Filled 2020-01-29: qty 150

## 2020-01-29 MED ORDER — SODIUM CHLORIDE 0.9% FLUSH
10.0000 mL | INTRAVENOUS | Status: DC | PRN
  Administered 2020-01-29: 10 mL
  Filled 2020-01-29: qty 10

## 2020-01-29 MED ORDER — SODIUM CHLORIDE 0.9 % IV SOLN
175.0000 mg/m2 | Freq: Once | INTRAVENOUS | Status: AC
  Administered 2020-01-29: 318 mg via INTRAVENOUS
  Filled 2020-01-29: qty 53

## 2020-01-29 MED ORDER — FAMOTIDINE IN NACL 20-0.9 MG/50ML-% IV SOLN
20.0000 mg | Freq: Once | INTRAVENOUS | Status: AC
  Administered 2020-01-29: 20 mg via INTRAVENOUS

## 2020-01-29 NOTE — Patient Instructions (Signed)
[place] Discharge Instructions for Patients Receiving Chemotherapy  Today you received the following chemotherapy agents: Paclitaxel and Carboplatin   To help prevent nausea and vomiting after your treatment, we encourage you to take your nausea medication as prescribed.    If you develop nausea and vomiting that is not controlled by your nausea medication, call the clinic.   BELOW ARE SYMPTOMS THAT SHOULD BE REPORTED IMMEDIATELY:  *FEVER GREATER THAN 100.5 F  *CHILLS WITH OR WITHOUT FEVER  NAUSEA AND VOMITING THAT IS NOT CONTROLLED WITH YOUR NAUSEA MEDICATION  *UNUSUAL SHORTNESS OF BREATH  *UNUSUAL BRUISING OR BLEEDING  TENDERNESS IN MOUTH AND THROAT WITH OR WITHOUT PRESENCE OF ULCERS  *URINARY PROBLEMS  *BOWEL PROBLEMS  UNUSUAL RASH Items with * indicate a potential emergency and should be followed up as soon as possible.  Feel free to call the clinic should you have any questions or concerns. The clinic phone number is [phone].  Please show the [facility] at check-in to the Emergency Department and triage nurse.   Hypomagnesemia Hypomagnesemia is a condition in which the level of magnesium in the blood is low. Magnesium is a mineral that is found in many foods. It is used in many different processes in the body. Hypomagnesemia can affect every organ in the body. In severe cases, it can cause life-threatening problems. What are the causes? This condition may be caused by:  Not getting enough magnesium in your diet.  Malnutrition.  Problems with absorbing magnesium from the intestines.  Dehydration.  Alcohol abuse.  Vomiting.  Severe or chronic diarrhea.  Some medicines, including medicines that make you urinate more (diuretics).  Certain diseases, such as kidney disease, diabetes, celiac disease, and overactive thyroid. What are the signs or symptoms? Symptoms of this condition include:  Loss of appetite.  Nausea and  vomiting.  Involuntary shaking or trembling of a body part (tremor).  Muscle weakness.  Tingling in the arms and legs.  Sudden tightening of muscles (muscle spasms).  Confusion.  Psychiatric issues, such as depression, irritability, or psychosis.  A feeling of fluttering of the heart.  Seizures. These symptoms are more severe if magnesium levels drop suddenly. How is this diagnosed? This condition may be diagnosed based on:  Your symptoms and medical history.  A physical exam.  Blood and urine tests. How is this treated? Treatment depends on the cause and the severity of the condition. It may be treated with:  A magnesium supplement. This can be taken in pill form. If the condition is severe, magnesium is usually given through an IV.  Changes to your diet. You may be directed to eat foods that have a lot of magnesium, such as green leafy vegetables, peas, beans, and nuts.  Stopping any intake of alcohol. Follow these instructions at home:      Make sure that your diet includes foods with magnesium. Foods that have a lot of magnesium in them include: ? Green leafy vegetables, such as spinach and broccoli. ? Beans and peas. ? Nuts and seeds, such as almonds and sunflower seeds. ? Whole grains, such as whole grain bread and fortified cereals.  Take magnesium supplements if your health care provider tells you to do that. Take them as directed.  Take over-the-counter and prescription medicines only as told by your health care provider.  Have your magnesium levels monitored as told by your health care provider.  When you are active, drink fluids that contain electrolytes.  Avoid drinking alcohol.  Keep all follow-up visits as told by your health care provider. This is important. Contact a health care provider if:  You get worse instead of better.  Your symptoms return. Get help right away if you:  Develop severe muscle weakness.  Have trouble  breathing.  Feel that your heart is racing. Summary  Hypomagnesemia is a condition in which the level of magnesium in the blood is low.  Hypomagnesemia can affect every organ in the body.  Treatment may include eating more foods that contain magnesium, taking magnesium supplements, and not drinking alcohol.  Have your magnesium levels monitored as told by your health care provider. This information is not intended to replace advice given to you by your health care provider. Make sure you discuss any questions you have with your health care provider. Document Revised: 04/16/2017 Document Reviewed: 04/05/2017 Elsevier Patient Education  2020 [employer].

## 2020-01-29 NOTE — Progress Notes (Signed)
Reported magnesium 1.4 to Dr. Alvy Bimler.

## 2020-01-29 NOTE — Progress Notes (Signed)
   Covid-19 Vaccination Clinic  Name:  [name] L Stapleton    MRN: [id number] DOB: 1955/10/05  01/29/2020  Ms. Tonner was observed post Covid-19 immunization for 15 minutes without incident. She was provided with Vaccine Information Sheet and instruction to access the V-Safe system.   Ms. Sinor was instructed to call [phone] with any severe reactions post vaccine: Marland Kitchen Difficulty breathing  . Swelling of face and throat  . A fast heartbeat  . A bad rash all over body  . Dizziness and weakness

## 2020-01-29 NOTE — Assessment & Plan Note (Addendum)
So far, she tolerated treatment well except for some mild aches and pain, mild anemia and fatigue We will proceed with treatment as scheduled After her fourth treatment, we will stop treatment with plan to repeat CT imaging studies

## 2020-01-29 NOTE — Progress Notes (Signed)
Brittney Lamb OFFICE PROGRESS NOTE  Patient Care Team: [name], MD as PCP - General (Family Medicine) Brittney Lamb Brittney Cotta, RN as Oncology Nurse Navigator (Oncology)  ASSESSMENT & PLAN:  Endometrial cancer [facility]) So far, she tolerated treatment well except for some mild aches and pain, mild anemia and fatigue We will proceed with treatment as scheduled After her fourth treatment, we will stop treatment with plan to repeat CT imaging studies  Anemia due to antineoplastic chemotherapy This is likely due to recent treatment. The patient denies recent history of bleeding such as epistaxis, hematuria or hematochezia. She is asymptomatic from the anemia. I will observe for now.  She does not require transfusion now. I will continue the chemotherapy at current dose without dosage adjustment.  If the anemia gets progressive worse in the future, I might have to delay her treatment or adjust the chemotherapy dose.   Peripheral neuropathy due to chemotherapy [facility]) She has no residual neuropathy from prior chemo We will observe closely for now  Hypomagnesemia I recommend low-dose oral magnesium supplement   No orders of the defined types were placed in this encounter.   All questions were answered. The patient knows to call the clinic with any problems, questions or concerns. The total time spent in the appointment was 20 minutes encounter with patients including review of chart and various tests results, discussions about plan of care and coordination of care plan   Brittney Lark, MD 01/29/2020 12:09 PM  INTERVAL HISTORY: Please see below for problem oriented charting. 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 Possible 1.9 cm solid echogenic endometrial mass. In the setting of post-menopausal bleeding, endometrial sampling is indicated to exclude carcinoma. I 2. Enlarged uterus with at least 1 calcified fibroid at the fundus measuring 2.6 cm. 3. Heterogenous fluid within the endometrial canal, can be seen in the setting of cervical obstruction. 4. Nonvisualized ovaries   06/30/2019 Initial Biopsy   EMB: high grade adenocarcinoma   07/14/2019 Tumor Marker   CA-125: 354   07/21/2019 Imaging   CT A/P: IMPRESSION: 1. No gross extension of uterine carcinoma beyond the myometrium. 2. No pelvic lymphadenopathy. 3. No retroperitoneal periaortic adenopathy. 4. No visceral metastasis or skeletal metastasis. 5. Benign adenoma of the LEFT adrenal gland.  Benign renal cysts.   08/15/2019 Surgery   Robotic-assisted laparoscopic total hysterectomy with bilateral salpingoophorectomy, SLN injection, bilateral pelvic LND, mini-lap for specimen removal Modifier 22: significant obesity causing difficulty visualizing anatomy, increasing OR time    08/15/2019 Pathology Results   A. 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  - The surgical resection margins are negative for carcinoma.  - See oncology table below.   Myometrium: Leiomyomata.  Serosa: Unremarkable.  Bilateral adnexa: Benign ovaries and fallopian tubes with endometriosis.   B. LYMPH NODES, RIGHT PELVIC, RESECTION:  - There is no evidence of carcinoma in 6 of 6 lymph nodes (0/6).   C. LYMPH NODES, LEFT PELVIC, RESECTION:  - There is  no evidence of carcinoma in 7 of 7 lymph nodes (0/7).    ONCOLOGY TABLE:    UTERUS, CARCINOMA OR CARCINOSARCOMA   Procedure: Total hysterectomy and bilateral salpingo-oophorectomy  Histologic type: Clear cell adenocarcinoma  Histologic Grade: High-grade  Myometrial invasion:       Depth of invasion: 19.5 mm       Myometrial thickness: 20 mm  Uterine Serosa Involvement: Not definitively identified  Cervical stromal involvement: Present  Extent of involvement of other organs: Confined to lower uterine segment  Lymphovascular invasion: Not identified  Regional Lymph Nodes:       Examined:        0 Sentinel                               13 non-sentinel                               13 total        Lymph nodes with metastasis: 0  Representative Tumor Block: A17  MMR / MSI testing: Can be ordered upon clinician request.  Pathologic Stage Classification (pTNM, AJCC 8th edition):  pT2, pN0  (FIGO stage II)  Comments: Dr. Claudette Laws has reviewed selected slides and concurs with  the phenotype of this tumor.  Additional studies can be performed upon  clinician request.    08/15/2019 Cancer Staging   Staging form: Corpus Uteri - Carcinoma and Carcinosarcoma, AJCC 8th Edition - Clinical stage from 08/15/2019: FIGO Stage II (cT2, cN0, cM0) - Signed by Lafonda Mosses, MD on 08/23/2019   09/18/2019 Imaging   Status post recent hysterectomy and bilateral salpingo-oophorectomy. Loculated fluid collections along the lateral pelvic sidewall may represent postoperative seroma or lymphocele. Superimposed infection or abscess are not excluded. Clinical correlation is recommended.   09/18/2019 Genetic Testing   PNegative genetic testing:  No pathogenic variants detected on the [employer] Common Hereditary Cancers Panel. The report date is 09/18/2019.  The Common Hereditary Cancers Panel offered by [facility] includes sequencing and/or deletion duplication testing of the following 48 genes: APC, ATM, AXIN2, BARD1, BMPR1A, BRCA1, BRCA2, BRIP1, CDH1, CDK4, CDKN2A (p14ARF), CDKN2A  (p16INK4a), CHEK2, CTNNA1, DICER1, EPCAM (Deletion/duplication testing only), GREM1 (promoter region deletion/duplication testing only), KIT, MEN1, MLH1, MSH2, MSH3, MSH6, MUTYH, NBN, NF1, NHTL1, PALB2, PDGFRA, PMS2, POLD1, POLE, PTEN, RAD50, RAD51C, RAD51D, RNF43, SDHB, SDHC, SDHD, SMAD4, SMARCA4. STK11, TP53, TSC1, TSC2, and VHL.  The following genes were evaluated for sequence changes only: SDHA and HOXB13 c.251G>A variant only.    09/20/2019 Procedure   Ultrasound and fluoroscopically guided right internal jugular single lumen power port catheter insertion. Tip in the SVC/RA junction. Catheter ready for use.   12/18/2019 -  Chemotherapy   The patient had palonosetron (ALOXI) injection 0.25 mg, 0.25 mg, Intravenous,  Once, 3 of 4 cycles Administration: 0.25 mg (12/18/2019), 0.25 mg (01/08/2020) CARBOplatin (PARAPLATIN) 500 mg in sodium chloride 0.9 % 250 mL chemo infusion, 500 mg (91.3 % of original dose 550.2 mg), Intravenous,  Once, 3 of 4 cycles Dose modification:   (original dose 550.2 mg, Cycle 1) Administration: 500 mg (12/18/2019), 500 mg (01/08/2020) fosaprepitant (EMEND) 150 mg in sodium chloride 0.9 % 145 mL IVPB, 150 mg, Intravenous,  Once, 3 of 4 cycles Administration: 150 mg (12/18/2019), 150 mg (01/08/2020) PACLitaxel (TAXOL) 318 mg in sodium chloride 0.9 % 500 mL chemo infusion (> 8m/m2), 175 mg/m2 =  318 mg, Intravenous,  Once, 3 of 4 cycles Administration: 318 mg (12/18/2019), 318 mg (01/08/2020)  for chemotherapy treatment.      REVIEW OF SYSTEMS:   Constitutional: Denies fevers, chills or abnormal weight loss Eyes: Denies blurriness of vision Ears, nose, mouth, throat, and face: Denies mucositis or sore throat Respiratory: Denies cough, dyspnea or wheezes Cardiovascular: Denies palpitation, chest discomfort or lower extremity swelling Gastrointestinal:  Denies nausea, heartburn or change in bowel habits Skin: Denies abnormal skin rashes Lymphatics: Denies new lymphadenopathy or  easy bruising Behavioral/Psych: Mood is stable, no new changes  All other systems were reviewed with the patient and are negative.  I have reviewed the past medical history, past surgical history, social history and family history with the patient and they are unchanged from previous note.  ALLERGIES:  is allergic to lisinopril and saxenda [liraglutide -weight management].  MEDICATIONS:  Current Outpatient Medications  Medication Sig Dispense Refill  . acetaminophen (TYLENOL) 500 MG tablet Take 500 mg by mouth every 6 (six) hours as needed.    Marland Kitchen amLODipine (NORVASC) 2.5 MG tablet Take 2.5 mg by mouth daily.    Marland Kitchen dexamethasone (DECADRON) 4 MG tablet Take 2 tabs at the night before and 2 tab the morning of chemotherapy, every 3 weeks, by mouth 4 tablet 4  . hydrochlorothiazide (HYDRODIURIL) 25 MG tablet Take 25 mg by mouth daily.    Marland Kitchen lidocaine-prilocaine (EMLA) cream Apply to affected area once 30 g 3  . magnesium oxide (MAG-OX) 400 (241.3 Mg) MG tablet Take 1 tablet (400 mg total) by mouth daily. 30 tablet 1  . meloxicam (MOBIC) 15 MG tablet Take 15 mg by mouth daily.    . Multiple Vitamin (MULTIVITAMIN) tablet Take 1 tablet by mouth daily.    . ondansetron (ZOFRAN) 8 MG tablet Take 1 tablet (8 mg total) by mouth 2 (two) times daily as needed. Start on the third day after chemotherapy. 30 tablet 1  . oxyCODONE (OXY IR/ROXICODONE) 5 MG immediate release tablet Take 1 tablet (5 mg total) by mouth every 4 (four) hours as needed for severe pain. 30 tablet 0  . prochlorperazine (COMPAZINE) 10 MG tablet Take 1 tablet (10 mg total) by mouth every 6 (six) hours as needed (Nausea or vomiting). 30 tablet 1  . rosuvastatin (CRESTOR) 20 MG tablet Take 20 mg by mouth daily.     No current facility-administered medications for this visit.   Facility-Administered Medications Ordered in Other Visits  Medication Dose Route Frequency Provider Last Rate Last Admin  . CARBOplatin (PARAPLATIN) 500 mg in  sodium chloride 0.9 % 250 mL chemo infusion  500 mg Intravenous Once Alvy Bimler, Jessikah Dicker, MD      . fosaprepitant (EMEND) 150 mg in sodium chloride 0.9 % 145 mL IVPB  150 mg Intravenous Once Alvy Bimler, Shaolin Armas, MD      . heparin lock flush 100 unit/mL  500 Units Intracatheter Once PRN Alvy Bimler, Jayveon Convey, MD      . PACLitaxel (TAXOL) 318 mg in sodium chloride 0.9 % 500 mL chemo infusion (> 36m/m2)  175 mg/m2 (Treatment Plan Adjusted) Intravenous Once Cadarius Nevares, MD      . sodium chloride flush (NS) 0.9 % injection 10 mL  10 mL Intravenous Once GAlvy Bimler Susen Haskew, MD        PHYSICAL EXAMINATION: ECOG PERFORMANCE STATUS: 1 - Symptomatic but completely ambulatory  Vitals:   01/29/20 1041  BP: (!) 148/79  Pulse: 70  Resp: 16  Temp: (!) 97.5 F (36.4 C)  SpO2: 100%   Filed Weights   01/29/20 1041  Weight: 273 lb 6.4 oz (124 kg)    GENERAL:alert, no distress and comfortable SKIN: skin color, texture, turgor are normal, no rashes or significant lesions EYES: normal, Conjunctiva are pink and non-injected, sclera clear OROPHARYNX:no exudate, no erythema and lips, buccal mucosa, and tongue normal  NECK: supple, thyroid normal size, non-tender, without nodularity LYMPH:  no palpable lymphadenopathy in the cervical, axillary or inguinal LUNGS: clear to auscultation and percussion with normal breathing effort HEART: regular rate & rhythm and no murmurs with mild bilateral lower extremity edema ABDOMEN:abdomen soft, non-tender and normal bowel sounds Musculoskeletal:no cyanosis of digits and no clubbing  NEURO: alert & oriented x 3 with fluent speech, no focal motor/sensory deficits  LABORATORY DATA:  I have reviewed the data as listed    Component Value Date/Time   NA 139 01/29/2020 1025   K 4.2 01/29/2020 1025   CL 105 01/29/2020 1025   CO2 27 01/29/2020 1025   GLUCOSE 144 (H) 01/29/2020 1025   BUN 15 01/29/2020 1025   CREATININE 1.12 (H) 01/29/2020 1025   CALCIUM 10.4 (H) 01/29/2020 1025   PROT 8.3 (H)  01/29/2020 1025   ALBUMIN 3.8 01/29/2020 1025   AST 21 01/29/2020 1025   ALT 17 01/29/2020 1025   ALKPHOS 98 01/29/2020 1025   BILITOT 0.5 01/29/2020 1025   GFRNONAA 52 (L) 01/29/2020 1025   GFRAA 60 (L) 01/29/2020 1025    No results found for: SPEP, UPEP  Lab Results  Component Value Date   WBC 7.4 01/29/2020   NEUTROABS 6.4 01/29/2020   HGB 11.2 (L) 01/29/2020   HCT 34.2 (L) 01/29/2020   MCV 89.5 01/29/2020   PLT 226 01/29/2020      Chemistry      Component Value Date/Time   NA 139 01/29/2020 1025   K 4.2 01/29/2020 1025   CL 105 01/29/2020 1025   CO2 27 01/29/2020 1025   BUN 15 01/29/2020 1025   CREATININE 1.12 (H) 01/29/2020 1025      Component Value Date/Time   CALCIUM 10.4 (H) 01/29/2020 1025   ALKPHOS 98 01/29/2020 1025   AST 21 01/29/2020 1025   ALT 17 01/29/2020 1025   BILITOT 0.5 01/29/2020 1025

## 2020-01-29 NOTE — Patient Instructions (Signed)

## 2020-01-29 NOTE — Assessment & Plan Note (Signed)
She has no residual neuropathy from prior chemo We will observe closely for now

## 2020-01-29 NOTE — Assessment & Plan Note (Signed)
I recommend low-dose oral magnesium supplement

## 2020-01-29 NOTE — Assessment & Plan Note (Signed)

## 2020-02-19 ENCOUNTER — Inpatient Hospital Stay: Payer: Self-pay | Attending: Hematology and Oncology

## 2020-02-19 ENCOUNTER — Encounter: Payer: Self-pay | Admitting: Hematology and Oncology

## 2020-02-19 ENCOUNTER — Other Ambulatory Visit: Payer: Self-pay

## 2020-02-19 ENCOUNTER — Inpatient Hospital Stay (HOSPITAL_BASED_OUTPATIENT_CLINIC_OR_DEPARTMENT_OTHER): Payer: Medicaid Other | Admitting: Hematology and Oncology

## 2020-02-19 ENCOUNTER — Telehealth: Payer: Self-pay | Admitting: *Deleted

## 2020-02-19 ENCOUNTER — Inpatient Hospital Stay: Payer: Medicaid Other

## 2020-02-19 ENCOUNTER — Inpatient Hospital Stay: Payer: Self-pay

## 2020-02-19 VITALS — BP 153/91 | HR 93 | Temp 97.5°F | Resp 18 | Ht 61.0 in | Wt 277.6 lb

## 2020-02-19 VITALS — BP 137/83

## 2020-02-19 DIAGNOSIS — Z5111 Encounter for antineoplastic chemotherapy: Secondary | ICD-10-CM | POA: Insufficient documentation

## 2020-02-19 DIAGNOSIS — C541 Malignant neoplasm of endometrium: Secondary | ICD-10-CM

## 2020-02-19 DIAGNOSIS — D6481 Anemia due to antineoplastic chemotherapy: Secondary | ICD-10-CM

## 2020-02-19 DIAGNOSIS — G620 Drug-induced polyneuropathy: Secondary | ICD-10-CM

## 2020-02-19 DIAGNOSIS — T451X5A Adverse effect of antineoplastic and immunosuppressive drugs, initial encounter: Secondary | ICD-10-CM

## 2020-02-19 DIAGNOSIS — K5909 Other constipation: Secondary | ICD-10-CM

## 2020-02-19 LAB — CBC WITH DIFFERENTIAL (CANCER CENTER ONLY)
Abs Immature Granulocytes: 0.16 10*3/uL — ABNORMAL HIGH (ref 0.00–0.07)
Basophils Absolute: 0 10*3/uL (ref 0.0–0.1)
Basophils Relative: 0 %
Eosinophils Absolute: 0 10*3/uL (ref 0.0–0.5)
Eosinophils Relative: 0 %
HCT: 32.3 % — ABNORMAL LOW (ref 36.0–46.0)
Hemoglobin: 10.3 g/dL — ABNORMAL LOW (ref 12.0–15.0)
Immature Granulocytes: 2 %
Lymphocytes Relative: 10 %
Lymphs Abs: 0.8 10*3/uL (ref 0.7–4.0)
MCH: 29.2 pg (ref 26.0–34.0)
MCHC: 31.9 g/dL (ref 30.0–36.0)
MCV: 91.5 fL (ref 80.0–100.0)
Monocytes Absolute: 0.2 10*3/uL (ref 0.1–1.0)
Monocytes Relative: 2 %
Neutro Abs: 7.1 10*3/uL (ref 1.7–7.7)
Neutrophils Relative %: 86 %
Platelet Count: 239 10*3/uL (ref 150–400)
RBC: 3.53 MIL/uL — ABNORMAL LOW (ref 3.87–5.11)
RDW: 14.6 % (ref 11.5–15.5)
WBC Count: 8.3 10*3/uL (ref 4.0–10.5)
nRBC: 0.5 % — ABNORMAL HIGH (ref 0.0–0.2)

## 2020-02-19 LAB — CMP (CANCER CENTER ONLY)
ALT: 18 U/L (ref 0–44)
AST: 21 U/L (ref 15–41)
Albumin: 3.6 g/dL (ref 3.5–5.0)
Alkaline Phosphatase: 85 U/L (ref 38–126)
Anion gap: 6 (ref 5–15)
BUN: 20 mg/dL (ref 8–23)
CO2: 28 mmol/L (ref 22–32)
Calcium: 10.1 mg/dL (ref 8.9–10.3)
Chloride: 104 mmol/L (ref 98–111)
Creatinine: 1.12 mg/dL — ABNORMAL HIGH (ref 0.44–1.00)
GFR, Est AFR Am: 60 mL/min — ABNORMAL LOW (ref >60)
GFR, Estimated: 52 mL/min — ABNORMAL LOW (ref >60)
Glucose, Bld: 155 mg/dL — ABNORMAL HIGH (ref 70–99)
Potassium: 4 mmol/L (ref 3.5–5.1)
Sodium: 138 mmol/L (ref 135–145)
Total Bilirubin: 0.4 mg/dL (ref 0.3–1.2)
Total Protein: 7.8 g/dL (ref 6.5–8.1)

## 2020-02-19 LAB — MAGNESIUM: Magnesium: 1.3 mg/dL — CL (ref 1.7–2.4)

## 2020-02-19 MED ORDER — PALONOSETRON HCL INJECTION 0.25 MG/5ML
0.2500 mg | Freq: Once | INTRAVENOUS | Status: AC
  Administered 2020-02-19: 0.25 mg via INTRAVENOUS

## 2020-02-19 MED ORDER — SODIUM CHLORIDE 0.9 % IV SOLN
150.0000 mg | Freq: Once | INTRAVENOUS | Status: AC
  Administered 2020-02-19: 150 mg via INTRAVENOUS
  Filled 2020-02-19: qty 150

## 2020-02-19 MED ORDER — FAMOTIDINE IN NACL 20-0.9 MG/50ML-% IV SOLN
20.0000 mg | Freq: Once | INTRAVENOUS | Status: AC
  Administered 2020-02-19: 20 mg via INTRAVENOUS

## 2020-02-19 MED ORDER — FAMOTIDINE IN NACL 20-0.9 MG/50ML-% IV SOLN
INTRAVENOUS | Status: AC
  Filled 2020-02-19: qty 50

## 2020-02-19 MED ORDER — ACETAMINOPHEN 325 MG PO TABS
ORAL_TABLET | ORAL | Status: AC
  Filled 2020-02-19: qty 2

## 2020-02-19 MED ORDER — SODIUM CHLORIDE 0.9 % IV SOLN
10.0000 mg | Freq: Once | INTRAVENOUS | Status: AC
  Administered 2020-02-19: 10 mg via INTRAVENOUS
  Filled 2020-02-19: qty 10

## 2020-02-19 MED ORDER — SODIUM CHLORIDE 0.9% FLUSH
10.0000 mL | Freq: Once | INTRAVENOUS | Status: AC
  Administered 2020-02-19: 10 mL
  Filled 2020-02-19: qty 10

## 2020-02-19 MED ORDER — HEPARIN SOD (PORK) LOCK FLUSH 100 UNIT/ML IV SOLN
500.0000 [IU] | Freq: Once | INTRAVENOUS | Status: AC | PRN
  Administered 2020-02-19: 500 [IU]
  Filled 2020-02-19: qty 5

## 2020-02-19 MED ORDER — SODIUM CHLORIDE 0.9 % IV SOLN
Freq: Once | INTRAVENOUS | Status: AC
  Filled 2020-02-19: qty 250

## 2020-02-19 MED ORDER — PALONOSETRON HCL INJECTION 0.25 MG/5ML
INTRAVENOUS | Status: AC
  Filled 2020-02-19: qty 5

## 2020-02-19 MED ORDER — DIPHENHYDRAMINE HCL 50 MG/ML IJ SOLN
50.0000 mg | Freq: Once | INTRAMUSCULAR | Status: AC
  Administered 2020-02-19: 50 mg via INTRAVENOUS

## 2020-02-19 MED ORDER — SODIUM CHLORIDE 0.9% FLUSH
10.0000 mL | INTRAVENOUS | Status: DC | PRN
  Administered 2020-02-19: 10 mL
  Filled 2020-02-19: qty 10

## 2020-02-19 MED ORDER — DIPHENHYDRAMINE HCL 50 MG/ML IJ SOLN
INTRAMUSCULAR | Status: AC
  Filled 2020-02-19: qty 1

## 2020-02-19 MED ORDER — SODIUM CHLORIDE 0.9 % IV SOLN
175.0000 mg/m2 | Freq: Once | INTRAVENOUS | Status: AC
  Administered 2020-02-19: 318 mg via INTRAVENOUS
  Filled 2020-02-19: qty 53

## 2020-02-19 MED ORDER — SODIUM CHLORIDE 0.9 % IV SOLN
500.0000 mg | Freq: Once | INTRAVENOUS | Status: AC
  Administered 2020-02-19: 500 mg via INTRAVENOUS
  Filled 2020-02-19: qty 50

## 2020-02-19 NOTE — Assessment & Plan Note (Signed)
This is likely due to recent treatment. The patient denies recent history of bleeding such as epistaxis, hematuria or hematochezia. She is asymptomatic from the anemia. I will observe for now.   

## 2020-02-19 NOTE — Assessment & Plan Note (Signed)
So far, she tolerated treatment well except for some mild aches and pain, mild anemia, constipation and fatigue We will proceed with treatment as scheduled After today's treatment, I will order CT imaging next month and she will see her GYN surgeon for further follow-up If CT imaging confirmed no evidence of disease, I recommend port removal

## 2020-02-19 NOTE — Patient Instructions (Signed)
[facility] Discharge Instructions for Patients Receiving Chemotherapy  Today you received the following chemotherapy agents: Paclitaxel (Taxol) and Carboplatin.  To help prevent nausea and vomiting after your treatment, we encourage you to take your nausea medication as directed by your MD.   If you develop nausea and vomiting that is not controlled by your nausea medication, call the clinic.   BELOW ARE SYMPTOMS THAT SHOULD BE REPORTED IMMEDIATELY:  *FEVER GREATER THAN 100.5 F  *CHILLS WITH OR WITHOUT FEVER  NAUSEA AND VOMITING THAT IS NOT CONTROLLED WITH YOUR NAUSEA MEDICATION  *UNUSUAL SHORTNESS OF BREATH  *UNUSUAL BRUISING OR BLEEDING  TENDERNESS IN MOUTH AND THROAT WITH OR WITHOUT PRESENCE OF ULCERS  *URINARY PROBLEMS  *BOWEL PROBLEMS  UNUSUAL RASH Items with * indicate a potential emergency and should be followed up as soon as possible.  Feel free to call the clinic should you have any questions or concerns. The clinic phone number is [phone].  Please show the CHEMO ALERT CARD at check-in to the Emergency Department and triage nurse.    

## 2020-02-19 NOTE — Telephone Encounter (Signed)
Received call from lab. Pt's Mg+ is 1.3  Pt is seeing Dr. Alvy Bimler today

## 2020-02-19 NOTE — Assessment & Plan Note (Addendum)
She has very mild grade 1 neuropathy only Observe for now It will improve in the future once chemotherapy is discontinued

## 2020-02-19 NOTE — Progress Notes (Signed)
Reported critical Magnesium 1.3 to Dr. Alvy Bimler.

## 2020-02-19 NOTE — Assessment & Plan Note (Signed)
We discussed the importance of taking aggressive laxatives while on treatment

## 2020-02-19 NOTE — Progress Notes (Signed)
[place] OFFICE PROGRESS NOTE  Patient Care Team: [name], MD as PCP - General (Family Medicine) Awanda Mink Craige Cotta, RN as Oncology Nurse Navigator (Oncology)  ASSESSMENT & PLAN:  Endometrial cancer [facility]) So far, she tolerated treatment well except for some mild aches and pain, mild anemia, constipation and fatigue We will proceed with treatment as scheduled After today's treatment, I will order CT imaging next month and she will see her GYN surgeon for further follow-up If CT imaging confirmed no evidence of disease, I recommend port removal  Anemia due to antineoplastic chemotherapy This is likely due to recent treatment. The patient denies recent history of bleeding such as epistaxis, hematuria or hematochezia. She is asymptomatic from the anemia. I will observe for now.    Other constipation We discussed the importance of taking aggressive laxatives while on treatment  Peripheral neuropathy due to chemotherapy [facility]) She has very mild grade 1 neuropathy only Observe for now It will improve in the future once chemotherapy is discontinued  Hypomagnesemia I recommend her to continue on low-dose oral magnesium supplement   Orders Placed This Encounter  Procedures  . CT ABDOMEN PELVIS W CONTRAST    Standing Status:   Future    Standing Expiration Date:   02/18/2021    Order Specific Question:   If indicated for the ordered procedure, I authorize the administration of contrast media per Radiology protocol    Answer:   Yes    Order Specific Question:   Preferred imaging location?    Answer:   [facility]    Order Specific Question:   Radiology Contrast Protocol - do NOT remove file path    Answer:   [email or web address]    All questions were answered. The patient knows to call the clinic with any problems, questions or concerns. The total time spent in the appointment was 30 minutes encounter with patients  including review of chart and various tests results, discussions about plan of care and coordination of care plan   Heath Lark, MD 02/19/2020 10:20 AM  INTERVAL HISTORY: Please see below for problem oriented charting. 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 Possible 1.9 cm solid echogenic endometrial mass. In the setting of post-menopausal bleeding, endometrial sampling is indicated to exclude carcinoma. I 2. Enlarged uterus with at least 1 calcified fibroid at the fundus measuring 2.6 cm. 3. Heterogenous fluid within the endometrial canal, can be seen in the setting of cervical obstruction. 4. Nonvisualized ovaries   06/30/2019 Initial Biopsy   EMB: high grade adenocarcinoma   07/14/2019 Tumor Marker   CA-125: 354   07/21/2019 Imaging   CT A/P: IMPRESSION: 1. No gross extension of uterine carcinoma beyond the myometrium. 2. No pelvic lymphadenopathy. 3. No retroperitoneal periaortic adenopathy. 4. No visceral metastasis or skeletal metastasis. 5. Benign adenoma of the LEFT adrenal gland.  Benign renal cysts.   08/15/2019 Surgery   Robotic-assisted laparoscopic total hysterectomy with bilateral salpingoophorectomy, SLN injection, bilateral pelvic LND, mini-lap for specimen removal Modifier 22: significant obesity causing difficulty visualizing anatomy, increasing OR time    08/15/2019 Pathology  Results   A. 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  - The surgical resection margins are negative for carcinoma.  - See oncology table below.   Myometrium: Leiomyomata.  Serosa: Unremarkable.  Bilateral adnexa: Benign ovaries and fallopian tubes with endometriosis.   B. LYMPH NODES, RIGHT PELVIC, RESECTION:  - There is no evidence of carcinoma in 6 of 6 lymph nodes (0/6).   C. LYMPH NODES, LEFT PELVIC, RESECTION:  - There is no evidence of carcinoma in 7 of 7 lymph nodes (0/7).    ONCOLOGY TABLE:   UTERUS, CARCINOMA OR CARCINOSARCOMA   Procedure: Total hysterectomy and bilateral salpingo-oophorectomy  Histologic type: Clear cell adenocarcinoma  Histologic Grade: High-grade  Myometrial invasion:       Depth of invasion: 19.5 mm       Myometrial thickness: 20 mm  Uterine Serosa Involvement: Not definitively identified  Cervical stromal involvement: Present  Extent of involvement of other organs: Confined to lower uterine segment  Lymphovascular invasion: Not identified  Regional Lymph Nodes:       Examined:        0 Sentinel                               13 non-sentinel                               13 total        Lymph nodes with metastasis: 0  Representative Tumor Block: A17  MMR / MSI testing: Can be ordered upon clinician request.  Pathologic Stage Classification (pTNM, AJCC 8th edition):  pT2, pN0  (FIGO stage II)  Comments: Dr. Claudette Laws has reviewed selected slides and concurs with  the phenotype of this tumor.  Additional studies can be performed upon  clinician request.    08/15/2019 Cancer Staging   Staging form: Corpus Uteri - Carcinoma and Carcinosarcoma, AJCC 8th Edition - Clinical stage from 08/15/2019: FIGO Stage II (cT2, cN0, cM0) - Signed by Lafonda Mosses, MD on 08/23/2019   09/18/2019 Imaging   Status post recent hysterectomy and bilateral salpingo-oophorectomy. Loculated fluid  collections along the lateral pelvic sidewall may represent postoperative seroma or lymphocele. Superimposed infection or abscess are not excluded. Clinical correlation is recommended.   09/18/2019 Genetic Testing   PNegative genetic testing:  No pathogenic variants detected on the [employer] Common Hereditary Cancers Panel. The report date is 09/18/2019.  The Common Hereditary Cancers Panel offered by [employer] includes sequencing and/or deletion duplication testing of the following 48 genes: APC, ATM, AXIN2, BARD1, BMPR1A, BRCA1, BRCA2, BRIP1, CDH1, CDK4, CDKN2A (p14ARF), CDKN2A (p16INK4a), CHEK2, CTNNA1, DICER1, EPCAM (Deletion/duplication testing only), GREM1 (promoter region deletion/duplication testing only), KIT, MEN1, MLH1, MSH2, MSH3, MSH6, MUTYH, NBN, NF1, NHTL1, PALB2, PDGFRA, PMS2, POLD1, POLE, PTEN, RAD50, RAD51C, RAD51D, RNF43, SDHB, SDHC, SDHD, SMAD4, SMARCA4. STK11, TP53, TSC1, TSC2, and VHL.  The following genes were evaluated for sequence changes only: SDHA and HOXB13 c.251G>A variant only.    09/20/2019 Procedure   Ultrasound and fluoroscopically guided right internal jugular single lumen power port catheter insertion. Tip in the SVC/RA junction. Catheter ready for use.   12/18/2019 -  Chemotherapy   The patient had palonosetron (ALOXI) injection 0.25 mg, 0.25  mg, Intravenous,  Once, 4 of 4 cycles Administration: 0.25 mg (12/18/2019), 0.25 mg (01/08/2020), 0.25 mg (01/29/2020) CARBOplatin (PARAPLATIN) 500 mg in sodium chloride 0.9 % 250 mL chemo infusion, 500 mg (91.3 % of original dose 550.2 mg), Intravenous,  Once, 4 of 4 cycles Dose modification:   (original dose 550.2 mg, Cycle 1) Administration: 500 mg (12/18/2019), 500 mg (01/08/2020), 500 mg (01/29/2020) fosaprepitant (EMEND) 150 mg in sodium chloride 0.9 % 145 mL IVPB, 150 mg, Intravenous,  Once, 4 of 4 cycles Administration: 150 mg (12/18/2019), 150 mg (01/08/2020), 150 mg (01/29/2020) PACLitaxel (TAXOL) 318 mg in sodium chloride 0.9 % 500 mL  chemo infusion (> 54m/m2), 175 mg/m2 = 318 mg, Intravenous,  Once, 4 of 4 cycles Administration: 318 mg (12/18/2019), 318 mg (01/08/2020), 318 mg (01/29/2020)  for chemotherapy treatment.      REVIEW OF SYSTEMS:   Constitutional: Denies fevers, chills or abnormal weight loss Eyes: Denies blurriness of vision Ears, nose, mouth, throat, and face: Denies mucositis or sore throat Respiratory: Denies cough, dyspnea or wheezes Cardiovascular: Denies palpitation, chest discomfort or lower extremity swelling Skin: Denies abnormal skin rashes Lymphatics: Denies new lymphadenopathy or easy bruising Behavioral/Psych: Mood is stable, no new changes  All other systems were reviewed with the patient and are negative.  I have reviewed the past medical history, past surgical history, social history and family history with the patient and they are unchanged from previous note.  ALLERGIES:  is allergic to lisinopril and saxenda [liraglutide -weight management].  MEDICATIONS:  Current Outpatient Medications  Medication Sig Dispense Refill  . acetaminophen (TYLENOL) 500 MG tablet Take 500 mg by mouth every 6 (six) hours as needed.    .Marland KitchenamLODipine (NORVASC) 2.5 MG tablet Take 2.5 mg by mouth daily.    .Marland Kitchendexamethasone (DECADRON) 4 MG tablet Take 2 tabs at the night before and 2 tab the morning of chemotherapy, every 3 weeks, by mouth 4 tablet 4  . hydrochlorothiazide (HYDRODIURIL) 25 MG tablet Take 25 mg by mouth daily.    .Marland Kitchenlidocaine-prilocaine (EMLA) cream Apply to affected area once 30 g 3  . magnesium oxide (MAG-OX) 400 (241.3 Mg) MG tablet Take 1 tablet (400 mg total) by mouth daily. 30 tablet 1  . meloxicam (MOBIC) 15 MG tablet Take 15 mg by mouth daily.    . Multiple Vitamin (MULTIVITAMIN) tablet Take 1 tablet by mouth daily.    . ondansetron (ZOFRAN) 8 MG tablet Take 1 tablet (8 mg total) by mouth 2 (two) times daily as needed. Start on the third day after chemotherapy. 30 tablet 1  . oxyCODONE (OXY  IR/ROXICODONE) 5 MG immediate release tablet Take 1 tablet (5 mg total) by mouth every 4 (four) hours as needed for severe pain. 30 tablet 0  . prochlorperazine (COMPAZINE) 10 MG tablet Take 1 tablet (10 mg total) by mouth every 6 (six) hours as needed (Nausea or vomiting). 30 tablet 1  . rosuvastatin (CRESTOR) 20 MG tablet Take 20 mg by mouth daily.     No current facility-administered medications for this visit.   Facility-Administered Medications Ordered in Other Visits  Medication Dose Route Frequency Provider Last Rate Last Admin  . CARBOplatin (PARAPLATIN) 520 mg in sodium chloride 0.9 % 250 mL chemo infusion  520 mg Intravenous Once GAlvy Bimler Brenleigh Collet, MD      . dexamethasone (DECADRON) 10 mg in sodium chloride 0.9 % 50 mL IVPB  10 mg Intravenous Once GAlvy Bimler Dois Juarbe, MD      . diphenhydrAMINE (BENADRYL)  injection 50 mg  50 mg Intravenous Once Alvy Bimler, Nayali Talerico, MD      . famotidine (PEPCID) IVPB 20 mg premix  20 mg Intravenous Once Alvy Bimler, Keana Dueitt, MD      . fosaprepitant (EMEND) 150 mg in sodium chloride 0.9 % 145 mL IVPB  150 mg Intravenous Once Alvy Bimler, Rahmel Nedved, MD      . heparin lock flush 100 unit/mL  500 Units Intracatheter Once PRN Alvy Bimler, Sircharles Holzheimer, MD      . PACLitaxel (TAXOL) 318 mg in sodium chloride 0.9 % 500 mL chemo infusion (> 6m/m2)  175 mg/m2 (Treatment Plan Adjusted) Intravenous Once GAlvy Bimler Jayton Popelka, MD      . palonosetron (ALOXI) injection 0.25 mg  0.25 mg Intravenous Once Raeleen Winstanley, MD      . sodium chloride flush (NS) 0.9 % injection 10 mL  10 mL Intracatheter PRN GAlvy Bimler Anasophia Pecor, MD        PHYSICAL EXAMINATION: ECOG PERFORMANCE STATUS: 1 - Symptomatic but completely ambulatory  Vitals:   02/19/20 0934  BP: (!) 153/91  Pulse: 93  Resp: 18  Temp: (!) 97.5 F (36.4 C)  SpO2: 100%   Filed Weights   02/19/20 0934  Weight: 277 lb 9.6 oz (125.9 kg)    GENERAL:alert, no distress and comfortable SKIN: skin color, texture, turgor are normal, no rashes or significant lesions EYES: normal,  Conjunctiva are pink and non-injected, sclera clear OROPHARYNX:no exudate, no erythema and lips, buccal mucosa, and tongue normal  NECK: supple, thyroid normal size, non-tender, without nodularity LYMPH:  no palpable lymphadenopathy in the cervical, axillary or inguinal LUNGS: clear to auscultation and percussion with normal breathing effort HEART: regular rate & rhythm and no murmurs and no lower extremity edema ABDOMEN:abdomen soft, non-tender and normal bowel sounds Musculoskeletal:no cyanosis of digits and no clubbing  NEURO: alert & oriented x 3 with fluent speech, no focal motor/sensory deficits  LABORATORY DATA:  I have reviewed the data as listed    Component Value Date/Time   NA 138 02/19/2020 0914   K 4.0 02/19/2020 0914   CL 104 02/19/2020 0914   CO2 28 02/19/2020 0914   GLUCOSE 155 (H) 02/19/2020 0914   BUN 20 02/19/2020 0914   CREATININE 1.12 (H) 02/19/2020 0914   CALCIUM 10.1 02/19/2020 0914   PROT 7.8 02/19/2020 0914   ALBUMIN 3.6 02/19/2020 0914   AST 21 02/19/2020 0914   ALT 18 02/19/2020 0914   ALKPHOS 85 02/19/2020 0914   BILITOT 0.4 02/19/2020 0914   GFRNONAA 52 (L) 02/19/2020 0914   GFRAA 60 (L) 02/19/2020 0914    No results found for: SPEP, UPEP  Lab Results  Component Value Date   WBC 8.3 02/19/2020   NEUTROABS 7.1 02/19/2020   HGB 10.3 (L) 02/19/2020   HCT 32.3 (L) 02/19/2020   MCV 91.5 02/19/2020   PLT 239 02/19/2020      Chemistry      Component Value Date/Time   NA 138 02/19/2020 0914   K 4.0 02/19/2020 0914   CL 104 02/19/2020 0914   CO2 28 02/19/2020 0914   BUN 20 02/19/2020 0914   CREATININE 1.12 (H) 02/19/2020 0914      Component Value Date/Time   CALCIUM 10.1 02/19/2020 0914   ALKPHOS 85 02/19/2020 0914   AST 21 02/19/2020 0914   ALT 18 02/19/2020 0914   BILITOT 0.4 02/19/2020 0914

## 2020-02-19 NOTE — Assessment & Plan Note (Signed)
I recommend her to continue on low-dose oral magnesium supplement

## 2020-03-05 ENCOUNTER — Ambulatory Visit: Payer: 59 | Admitting: Gynecologic Oncology

## 2020-03-19 ENCOUNTER — Encounter (HOSPITAL_COMMUNITY): Payer: Self-pay

## 2020-03-19 ENCOUNTER — Other Ambulatory Visit: Payer: Self-pay

## 2020-03-19 ENCOUNTER — Ambulatory Visit (HOSPITAL_COMMUNITY)
Admission: RE | Admit: 2020-03-19 | Discharge: 2020-03-19 | Disposition: A | Discharge status: Home / Self Care | Payer: Self-pay | Source: Ambulatory Visit | Attending: Hematology and Oncology | Admitting: Hematology and Oncology

## 2020-03-19 ENCOUNTER — Inpatient Hospital Stay: Payer: Self-pay | Attending: Hematology and Oncology

## 2020-03-19 ENCOUNTER — Inpatient Hospital Stay: Payer: Self-pay

## 2020-03-19 DIAGNOSIS — C541 Malignant neoplasm of endometrium: Secondary | ICD-10-CM | POA: Insufficient documentation

## 2020-03-19 DIAGNOSIS — Z923 Personal history of irradiation: Secondary | ICD-10-CM | POA: Insufficient documentation

## 2020-03-19 DIAGNOSIS — Z79899 Other long term (current) drug therapy: Secondary | ICD-10-CM | POA: Insufficient documentation

## 2020-03-19 DIAGNOSIS — K219 Gastro-esophageal reflux disease without esophagitis: Secondary | ICD-10-CM | POA: Insufficient documentation

## 2020-03-19 DIAGNOSIS — Z90722 Acquired absence of ovaries, bilateral: Secondary | ICD-10-CM | POA: Insufficient documentation

## 2020-03-19 DIAGNOSIS — I10 Essential (primary) hypertension: Secondary | ICD-10-CM | POA: Insufficient documentation

## 2020-03-19 DIAGNOSIS — Z9221 Personal history of antineoplastic chemotherapy: Secondary | ICD-10-CM | POA: Insufficient documentation

## 2020-03-19 DIAGNOSIS — Z6841 Body Mass Index (BMI) 40.0 and over, adult: Secondary | ICD-10-CM | POA: Insufficient documentation

## 2020-03-19 DIAGNOSIS — Z90710 Acquired absence of both cervix and uterus: Secondary | ICD-10-CM | POA: Insufficient documentation

## 2020-03-19 LAB — CBC WITH DIFFERENTIAL (CANCER CENTER ONLY)
Abs Immature Granulocytes: 0.02 10*3/uL (ref 0.00–0.07)
Basophils Absolute: 0 10*3/uL (ref 0.0–0.1)
Basophils Relative: 0 %
Eosinophils Absolute: 0.3 10*3/uL (ref 0.0–0.5)
Eosinophils Relative: 5 %
HCT: 31.9 % — ABNORMAL LOW (ref 36.0–46.0)
Hemoglobin: 10 g/dL — ABNORMAL LOW (ref 12.0–15.0)
Immature Granulocytes: 0 %
Lymphocytes Relative: 17 %
Lymphs Abs: 0.9 10*3/uL (ref 0.7–4.0)
MCH: 28.9 pg (ref 26.0–34.0)
MCHC: 31.3 g/dL (ref 30.0–36.0)
MCV: 92.2 fL (ref 80.0–100.0)
Monocytes Absolute: 0.7 10*3/uL (ref 0.1–1.0)
Monocytes Relative: 12 %
Neutro Abs: 3.6 10*3/uL (ref 1.7–7.7)
Neutrophils Relative %: 66 %
Platelet Count: 160 10*3/uL (ref 150–400)
RBC: 3.46 MIL/uL — ABNORMAL LOW (ref 3.87–5.11)
RDW: 15.8 % — ABNORMAL HIGH (ref 11.5–15.5)
WBC Count: 5.5 10*3/uL (ref 4.0–10.5)
nRBC: 0 % (ref 0.0–0.2)

## 2020-03-19 LAB — CMP (CANCER CENTER ONLY)
ALT: 14 U/L (ref 0–44)
AST: 18 U/L (ref 15–41)
Albumin: 3.6 g/dL (ref 3.5–5.0)
Alkaline Phosphatase: 76 U/L (ref 38–126)
Anion gap: 8 (ref 5–15)
BUN: 16 mg/dL (ref 8–23)
CO2: 28 mmol/L (ref 22–32)
Calcium: 9.2 mg/dL (ref 8.9–10.3)
Chloride: 105 mmol/L (ref 98–111)
Creatinine: 1.09 mg/dL — ABNORMAL HIGH (ref 0.44–1.00)
GFR, Estimated: 56 mL/min — ABNORMAL LOW (ref >60)
Glucose, Bld: 86 mg/dL (ref 70–99)
Potassium: 3.8 mmol/L (ref 3.5–5.1)
Sodium: 141 mmol/L (ref 135–145)
Total Bilirubin: 0.5 mg/dL (ref 0.3–1.2)
Total Protein: 7.3 g/dL (ref 6.5–8.1)

## 2020-03-19 LAB — MAGNESIUM: Magnesium: 1.6 mg/dL — ABNORMAL LOW (ref 1.7–2.4)

## 2020-03-19 MED ORDER — SODIUM CHLORIDE 0.9% FLUSH
10.0000 mL | Freq: Once | INTRAVENOUS | Status: AC
  Administered 2020-03-19: 10 mL
  Filled 2020-03-19: qty 10

## 2020-03-19 MED ORDER — HEPARIN SOD (PORK) LOCK FLUSH 100 UNIT/ML IV SOLN
500.0000 [IU] | Freq: Once | INTRAVENOUS | Status: AC
  Administered 2020-03-19: 500 [IU] via INTRAVENOUS

## 2020-03-19 MED ORDER — HEPARIN SOD (PORK) LOCK FLUSH 100 UNIT/ML IV SOLN
INTRAVENOUS | Status: AC
  Filled 2020-03-19: qty 5

## 2020-03-19 MED ORDER — IOHEXOL 300 MG/ML  SOLN
100.0000 mL | Freq: Once | INTRAMUSCULAR | Status: AC | PRN
  Administered 2020-03-19: 100 mL via INTRAVENOUS

## 2020-03-21 ENCOUNTER — Inpatient Hospital Stay (HOSPITAL_BASED_OUTPATIENT_CLINIC_OR_DEPARTMENT_OTHER): Payer: Self-pay | Admitting: Gynecologic Oncology

## 2020-03-21 ENCOUNTER — Inpatient Hospital Stay: Payer: Self-pay

## 2020-03-21 ENCOUNTER — Other Ambulatory Visit: Payer: Self-pay | Admitting: Hematology and Oncology

## 2020-03-21 ENCOUNTER — Other Ambulatory Visit: Payer: Self-pay

## 2020-03-21 ENCOUNTER — Encounter: Payer: Self-pay | Admitting: Gynecologic Oncology

## 2020-03-21 VITALS — BP 147/62 | HR 76 | Temp 98.4°F | Resp 18 | Wt 276.4 lb

## 2020-03-21 DIAGNOSIS — Z90710 Acquired absence of both cervix and uterus: Secondary | ICD-10-CM

## 2020-03-21 DIAGNOSIS — C541 Malignant neoplasm of endometrium: Secondary | ICD-10-CM

## 2020-03-21 DIAGNOSIS — Z9221 Personal history of antineoplastic chemotherapy: Secondary | ICD-10-CM

## 2020-03-21 DIAGNOSIS — Z90722 Acquired absence of ovaries, bilateral: Secondary | ICD-10-CM

## 2020-03-21 NOTE — Patient Instructions (Signed)
Your exam is normal today! You will see Dr. Sondra Come in 3 months. Around the time you see him, please call my clinic [phone]) to schedule a follow-up visit with me for early May. If you develop any new symptoms between visits (vaginal bleeding, pelvic pain, weight loss, change to your bowel movements), please call to be seen sooner.

## 2020-03-21 NOTE — Progress Notes (Signed)
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 Possible 1.9 cm solid echogenic endometrial mass. In the setting of post-menopausal bleeding, endometrial sampling is indicated to exclude carcinoma. I 2. Enlarged uterus with at least 1 calcified fibroid at the fundus measuring 2.6 cm. 3. Heterogenous fluid within the endometrial canal, can be seen in the setting of cervical obstruction. 4. Nonvisualized ovaries   06/30/2019 Initial Biopsy   EMB: high grade adenocarcinoma   07/14/2019 Tumor Marker   CA-125: 354   07/21/2019 Imaging   CT A/P: IMPRESSION: 1. No gross extension of uterine carcinoma beyond the myometrium. 2. No pelvic lymphadenopathy. 3. No retroperitoneal periaortic adenopathy. 4. No visceral metastasis or skeletal metastasis. 5. Benign adenoma of the LEFT adrenal gland.  Benign renal cysts.   08/15/2019 Surgery   Robotic-assisted laparoscopic total hysterectomy with bilateral salpingoophorectomy, SLN injection, bilateral pelvic LND, mini-lap for specimen removal Modifier 22: significant obesity causing difficulty visualizing anatomy, increasing OR time    08/15/2019 Pathology Results   A. 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  - The surgical resection margins are negative for carcinoma.  - See oncology table below.   Myometrium: Leiomyomata.  Serosa: Unremarkable.  Bilateral adnexa: Benign ovaries  and fallopian tubes with endometriosis.   B. LYMPH NODES, RIGHT PELVIC, RESECTION:  - There is no evidence of carcinoma in 6 of 6 lymph nodes (0/6).   C. LYMPH NODES, LEFT PELVIC, RESECTION:  - There is no evidence of carcinoma in 7 of 7 lymph nodes (0/7).    ONCOLOGY TABLE:   UTERUS, CARCINOMA OR CARCINOSARCOMA   Procedure: Total hysterectomy and bilateral salpingo-oophorectomy  Histologic type: Clear cell adenocarcinoma  Histologic Grade: High-grade  Myometrial invasion:       Depth of invasion: 19.5 mm       Myometrial thickness: 20 mm  Uterine Serosa Involvement: Not definitively identified  Cervical stromal involvement: Present  Extent of involvement of other organs: Confined to lower uterine segment  Lymphovascular invasion: Not identified  Regional Lymph Nodes:       Examined:        0 Sentinel                               13 non-sentinel                               13 total        Lymph nodes with metastasis: 0  Representative Tumor Block: A17  MMR / MSI testing: Can be ordered upon clinician request.  Pathologic Stage Classification (pTNM, AJCC 8th edition):  pT2, pN0  (FIGO stage II)  Comments: Dr. Claudette Laws has reviewed selected slides and concurs with  the phenotype of this tumor.  Additional studies can be performed upon  clinician request.    08/15/2019 Cancer Staging   Staging form: Corpus Uteri - Carcinoma and Carcinosarcoma, AJCC  8th Edition - Clinical stage from 08/15/2019: FIGO Stage II (cT2, cN0, cM0) - Signed by Lafonda Mosses, MD on 08/23/2019   09/18/2019 Imaging   Status post recent hysterectomy and bilateral salpingo-oophorectomy. Loculated fluid collections along the lateral pelvic sidewall may represent postoperative seroma or lymphocele. Superimposed infection or abscess are not excluded. Clinical correlation is recommended.   09/18/2019 Genetic Testing   PNegative genetic testing:  No pathogenic variants detected on the [facility] Common  Hereditary Cancers Panel. The report date is 09/18/2019.  The Common Hereditary Cancers Panel offered by [facility] includes sequencing and/or deletion duplication testing of the following 48 genes: APC, ATM, AXIN2, BARD1, BMPR1A, BRCA1, BRCA2, BRIP1, CDH1, CDK4, CDKN2A (p14ARF), CDKN2A (p16INK4a), CHEK2, CTNNA1, DICER1, EPCAM (Deletion/duplication testing only), GREM1 (promoter region deletion/duplication testing only), KIT, MEN1, MLH1, MSH2, MSH3, MSH6, MUTYH, NBN, NF1, NHTL1, PALB2, PDGFRA, PMS2, POLD1, POLE, PTEN, RAD50, RAD51C, RAD51D, RNF43, SDHB, SDHC, SDHD, SMAD4, SMARCA4. STK11, TP53, TSC1, TSC2, and VHL.  The following genes were evaluated for sequence changes only: SDHA and HOXB13 c.251G>A variant only.    09/20/2019 Procedure   Ultrasound and fluoroscopically guided right internal jugular single lumen power port catheter insertion. Tip in the SVC/RA junction. Catheter ready for use.   09/25/2019 - 10/23/2019 Chemotherapy   The patient had cisplatin for chemotherapy treatment.     09/26/2019 - 11/27/2019 Radiation Therapy   Site Technique Total Dose (Gy) Dose per Fx (Gy) Completed Fx Beam Energies  Vagina: Pelvis HDR-brachy 18/18 6 3/3 Ir-192  Uterus: Uterus IMRT 45/45 1.8 25/25 6X      12/18/2019 - 02/19/2020 Chemotherapy   The patient had carboplatin and taxol   03/19/2020 Imaging   1. Status post hysterectomy with bilateral salpingo oophorectomy since prior abdomen/pelvis CT of 07/21/2019. 2. Since pelvis CT 09/18/2019, interval decrease in size of the low-density lesions along the pelvic sidewalls bilaterally, compatible with resolving postoperative seromas. 3. 9 mm soft tissue nodule along the posterior right peritoneum, not definitely seen on prior imaging. Close attention on follow-up recommended. 4. Stable 12 mm left adrenal nodule, consistent with benign etiology.       Interval History: Patient presents today overall feeling very well.  She notes having minimal side effects  related to chemotherapy.  Since completing treatment, she denies any nausea or emesis.  She reports a good appetite.  She endorses regular bowel function now without the use of any medications.  She denies any urinary symptoms.  She denies vaginal bleeding or discharge.  Denies any abdominal or pelvic pain.  She has been using her vaginal dilator 3 times a week.  Past Medical/Surgical History: Past Medical History:  Diagnosis Date  . Arthritis    Spinal - inflammatory  . BMI 50.0-59.9, adult ([place])   . Endometrial cancer ([place])   . Family history of brain cancer   . Family history of cervical cancer   . Family history of colon cancer   . GERD (gastroesophageal reflux disease)   . Hypertension   . Morbid obesity ([place])   . Uterine cancer [facility])     Past Surgical History:  Procedure Laterality Date  . cardiac catherization  2011  . IR IMAGING GUIDED PORT INSERTION  09/20/2019  . LAPAROSCOPY  1996   scar tissue  . ROBOTIC ASSISTED TOTAL HYSTERECTOMY WITH BILATERAL SALPINGO OOPHERECTOMY Bilateral 08/15/2019   Procedure: XI ROBOTIC ASSISTED TOTAL HYSTERECTOMY WITH BILATERAL SALPINGO OOPHORECTOMY;  Surgeon: Lafonda Mosses, MD;  Location: [facility];  Service: Gynecology;  Laterality: Bilateral;  .  SENTINEL NODE BIOPSY N/A 08/15/2019   Procedure: LYMPH NODE DISSECTION,LAPAROTOMY;  Surgeon: Lafonda Mosses, MD;  Location: [facility];  Service: Gynecology;  Laterality: N/A;  . TONSILLECTOMY    . WISDOM TOOTH EXTRACTION      Family History  Problem Relation Age of Onset  . Heart disease Father   . Thyroid disease Father   . Hypertension Mother   . COPD Mother   . Hypertension Half-Brother   . Thyroid disease Half-Sister   . COPD Half-Sister   . Hypertension Half-Sister   . Neuropathy Half-Sister   . Cervical cancer Paternal Aunt        dx. in her late 70s  . Brain cancer Paternal Uncle 37  . Cervical cancer Paternal Grandmother        dx. in her 29s  . Uterine cancer Neg Hx   . Breast  cancer Neg Hx     Social History   Socioeconomic History  . Marital status: Married    Spouse name: Not on file  . Number of children: Not on file  . Years of education: Not on file  . Highest education level: Not on file  Occupational History  . Not on file  Tobacco Use  . Smoking status: Never Smoker  . Smokeless tobacco: Never Used  Vaping Use  . Vaping Use: Never used  Substance and Sexual Activity  . Alcohol use: Not Currently    Comment: rare glass of wine  . Drug use: Never  . Sexual activity: Not Currently  Other Topics Concern  . Not on file  Social History Narrative  . Not on file   Social Determinants of Health   Financial Resource Strain:   . Difficulty of Paying Living Expenses: Not on file  Food Insecurity:   . Worried About Charity fundraiser in the Last Year: Not on file  . Ran Out of Food in the Last Year: Not on file  Transportation Needs:   . Lack of Transportation (Medical): Not on file  . Lack of Transportation (Non-Medical): Not on file  Physical Activity:   . Days of Exercise per Week: Not on file  . Minutes of Exercise per Session: Not on file  Stress:   . Feeling of Stress : Not on file  Social Connections:   . Frequency of Communication with Friends and Family: Not on file  . Frequency of Social Gatherings with Friends and Family: Not on file  . Attends Religious Services: Not on file  . Active Member of Clubs or Organizations: Not on file  . Attends Archivist Meetings: Not on file  . Marital Status: Not on file    Current Medications:  Current Outpatient Medications:  .  acetaminophen (TYLENOL) 500 MG tablet, Take 500 mg by mouth every 6 (six) hours as needed., Disp: , Rfl:  .  amLODipine (NORVASC) 2.5 MG tablet, Take 2.5 mg by mouth daily., Disp: , Rfl:  .  hydrochlorothiazide (HYDRODIURIL) 25 MG tablet, Take 25 mg by mouth daily., Disp: , Rfl:  .  magnesium oxide (MAG-OX) 400 (241.3 Mg) MG tablet, Take 1 tablet (400 mg  total) by mouth daily., Disp: 30 tablet, Rfl: 1 .  meloxicam (MOBIC) 15 MG tablet, Take 15 mg by mouth daily., Disp: , Rfl:  .  Multiple Vitamin (MULTIVITAMIN) tablet, Take 1 tablet by mouth daily., Disp: , Rfl:  .  rosuvastatin (CRESTOR) 20 MG tablet, Take 20 mg by mouth daily., Disp: , Rfl:  .  dexamethasone (DECADRON) 4 MG tablet, Take 2 tabs at the night before and 2 tab the morning of chemotherapy, every 3 weeks, by mouth (Patient not taking: Reported on 03/21/2020), Disp: 4 tablet, Rfl: 4 .  diclofenac (VOLTAREN) 50 MG EC tablet, Take 50 mg by mouth 2 (two) times daily. (Patient not taking: Reported on 03/21/2020), Disp: , Rfl:  .  lidocaine-prilocaine (EMLA) cream, Apply to affected area once (Patient not taking: Reported on 03/21/2020), Disp: 30 g, Rfl: 3 .  ondansetron (ZOFRAN) 8 MG tablet, Take 1 tablet (8 mg total) by mouth 2 (two) times daily as needed. Start on the third day after chemotherapy. (Patient not taking: Reported on 03/21/2020), Disp: 30 tablet, Rfl: 1 .  oxyCODONE (OXY IR/ROXICODONE) 5 MG immediate release tablet, Take 1 tablet (5 mg total) by mouth every 4 (four) hours as needed for severe pain. (Patient not taking: Reported on 03/21/2020), Disp: 30 tablet, Rfl: 0 .  prochlorperazine (COMPAZINE) 10 MG tablet, Take 1 tablet (10 mg total) by mouth every 6 (six) hours as needed (Nausea or vomiting). (Patient not taking: Reported on 03/21/2020), Disp: 30 tablet, Rfl: 1  Review of Systems: + Leg swelling Denies appetite changes, fevers, chills, fatigue, unexplained weight changes. Denies hearing loss, neck lumps or masses, mouth sores, ringing in ears or voice changes. Denies cough or wheezing.  Denies shortness of breath. Denies chest pain or palpitations.  Denies abdominal distention, pain, blood in stools, constipation, diarrhea, nausea, vomiting, or early satiety. Denies pain with intercourse, dysuria, frequency, hematuria or incontinence. Denies hot flashes, pelvic pain,  vaginal bleeding or vaginal discharge.   Denies joint pain, back pain or muscle pain/cramps. Denies itching, rash, or wounds. Denies dizziness, headaches, numbness or seizures. Denies swollen lymph nodes or glands, denies easy bruising or bleeding. Denies anxiety, depression, confusion, or decreased concentration.  Physical Exam: BP (!) 147/62 (BP Location: Right Arm, Patient Position: Sitting)   Pulse 76   Temp 98.4 F (36.9 C) (Tympanic)   Resp 18   Wt 276 lb 6.4 oz (125.4 kg)   SpO2 100%   BMI 52.23 kg/m  General: Alert, oriented, no acute distress. HEENT: Normocephalic, atraumatic, sclera anicteric. Chest: Unlabored breathing on room air.  Port site clean. Abdomen: Obese, soft, nontender.  Normoactive bowel sounds.  No masses or hepatosplenomegaly appreciated.  Well-healed incisions. Extremities: Grossly normal range of motion.  Warm, well perfused.  1+ edema bilaterally. Skin: No rashes or lesions noted. Lymphatics: No cervical, supraclavicular, or inguinal adenopathy. GU: Normal appearing external genitalia without erythema, excoriation, or lesions.  Speculum exam reveals moderately atrophic vaginal mucosa, changes consistent with recent radiation.  Cuff intact, no masses or lesions noted.  Bimanual exam reveals cuff intact.  No nodularity. Rectovaginal exam confirms findings.  Laboratory & Radiologic Studies: CT A/P on 11/2: 1. Status post hysterectomy with bilateral salpingo oophorectomy since prior abdomen/pelvis CT of 07/21/2019. 2. Since pelvis CT 09/18/2019, interval decrease in size of the low-density lesions along the pelvic sidewalls bilaterally, compatible with resolving postoperative seromas. 3. 9 mm soft tissue nodule along the posterior right peritoneum, not definitely seen on prior imaging. Close attention on follow-up recommended. 4. Stable 12 mm left adrenal nodule, consistent with benign etiology.  Assessment & Plan: Brittney Lamb is a 64 y.o. 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   Patient has now completed adjuvant treatment and did very well.  Her posttreatment CT scan is negative for any evidence of disease.  Patient is very happy to hear this news.  We will move forward with taking out her port.  I reiterated the importance of using her vaginal dilator to help prevent agglutination of the vagina.  She is scheduled for follow-up to see Dr. Sondra Come in January, and she knows to call my office at that time to get a 22-monthfollow-up scheduled with me after.  Per [facility] surveillance recommendations, we will plan to see the patient every 3 months for 2 years.  We discussed signs and symptoms that would be concerning for recurrence and the patient knows to call if she develops any of these.   25 minutes of total time was spent for this patient encounter, including preparation, face-to-face counseling with the patient and coordination of care, and documentation of the encounter.  KJeral Pinch MD  Division of Gynecologic Oncology  Department of Obstetrics and Gynecology  [facility]

## 2020-03-22 LAB — CA 125: Cancer Antigen (CA) 125: 16.8 U/mL (ref 0.0–38.1)

## 2020-06-06 ENCOUNTER — Ambulatory Visit
Admission: RE | Admit: 2020-06-06 | Discharge: 2020-06-06 | Disposition: A | Discharge status: Home / Self Care | Payer: Medicaid Other | Source: Ambulatory Visit | Attending: Radiation Oncology | Admitting: Radiation Oncology

## 2020-06-06 ENCOUNTER — Encounter: Payer: Self-pay | Admitting: Radiation Oncology

## 2020-06-06 ENCOUNTER — Other Ambulatory Visit: Payer: Self-pay

## 2020-06-06 VITALS — BP 122/70 | HR 78 | Temp 97.8°F | Resp 20 | Ht 61.0 in | Wt 272.0 lb

## 2020-06-06 DIAGNOSIS — N888 Other specified noninflammatory disorders of cervix uteri: Secondary | ICD-10-CM

## 2020-06-06 DIAGNOSIS — Z8542 Personal history of malignant neoplasm of other parts of uterus: Secondary | ICD-10-CM | POA: Insufficient documentation

## 2020-06-06 DIAGNOSIS — C541 Malignant neoplasm of endometrium: Secondary | ICD-10-CM

## 2020-06-06 DIAGNOSIS — R5383 Other fatigue: Secondary | ICD-10-CM | POA: Insufficient documentation

## 2020-06-06 DIAGNOSIS — K5900 Constipation, unspecified: Secondary | ICD-10-CM | POA: Insufficient documentation

## 2020-06-06 DIAGNOSIS — D649 Anemia, unspecified: Secondary | ICD-10-CM | POA: Insufficient documentation

## 2020-06-06 DIAGNOSIS — Z79899 Other long term (current) drug therapy: Secondary | ICD-10-CM | POA: Insufficient documentation

## 2020-06-06 DIAGNOSIS — Z791 Long term (current) use of non-steroidal anti-inflammatories (NSAID): Secondary | ICD-10-CM | POA: Insufficient documentation

## 2020-06-06 NOTE — Progress Notes (Signed)
Radiation Oncology         ([phone]) [phone] ________________________________  Name: Brittney Lamb MRN: [id number]  Date: 06/06/2020  DOB: 06/12/1955  Follow-Up Visit Note  CC: [name], MD  [name], MD    ICD-10-CM   1. Endometrial cancer ([place])  C54.1   2. Cervical mass  N88.8     Diagnosis: FIGO stage II (cT2, cN0, cM0) high-grade invasive clear cell adenocarcinomaof the endometrium  Interval Since Last Radiation: Six months, one week, and one day  Radiation Treatment Dates: 09/26/2019 through 11/27/2019 Site Technique Total Dose (Gy) Dose per Fx (Gy) Completed Fx Beam Energies  Vagina: Pelvis HDR-brachy 18/18 6 3/3 Ir-192  Uterus: Uterus IMRT 45/45 1.8 25/25 6X    Narrative:  The patient returns today for routine follow-up. Since her last visit, she completed four cycles of chemotherapy with Carboplatin and Taxol under the care of Dr. Alvy Bimler. She tolerated treatment relatively well with the exception of mild aches and pain, mild anemia, constipation, and fatigue.  CT scan of abdomen and pelvis on 03/19/2020 showed interval decrease in size of the low-density lesions along the pelvic sidewalls bilaterally, compatible with resolving postoperative seromas. There was a 9 mm soft tissue nodule along the posterior right perineum that was not definitely seen on prior imaging. Finally, the 12 mm left adrenal nodule was stable, consistent with benign etiology.  She was last seen by Dr. Berline Lopes on 03/21/2020, during which time it was recommended that she remain under surveillance given CT results.  Pelvic exam at that time showed no evidence of recurrence.  On review of systems, she reports no new medical issues. She denies vaginal bleeding or discharge.  She denies any pelvic pain or abdominal bloating she continues to use her vaginal dilator and reports no vaginal spotting after its use.          ALLERGIES:  is allergic to lisinopril and saxenda [liraglutide -weight  management].  Meds: Current Outpatient Medications  Medication Sig Dispense Refill  . acetaminophen (TYLENOL) 500 MG tablet Take 500 mg by mouth every 6 (six) hours as needed.    Marland Kitchen amLODipine (NORVASC) 2.5 MG tablet Take 2.5 mg by mouth daily.    . diclofenac (VOLTAREN) 50 MG EC tablet Take 50 mg by mouth 2 (two) times daily.    . hydrochlorothiazide (HYDRODIURIL) 25 MG tablet Take 25 mg by mouth daily.    . magnesium oxide (MAG-OX) 400 (241.3 Mg) MG tablet Take 1 tablet (400 mg total) by mouth daily. 30 tablet 1  . meloxicam (MOBIC) 15 MG tablet Take 15 mg by mouth daily.    . Multiple Vitamin (MULTIVITAMIN) tablet Take 1 tablet by mouth daily.    . rosuvastatin (CRESTOR) 20 MG tablet Take 20 mg by mouth daily.    Marland Kitchen oxyCODONE (OXY IR/ROXICODONE) 5 MG immediate release tablet Take 1 tablet (5 mg total) by mouth every 4 (four) hours as needed for severe pain. (Patient not taking: No sig reported) 30 tablet 0   No current facility-administered medications for this encounter.    Physical Findings: The patient is in no acute distress. Patient is alert and oriented.  height is 5\' 1"  (1.549 m) and weight is 272 lb (123.4 kg). Her temperature is 97.8 F (36.6 C). Her blood pressure is 122/70 and her pulse is 78. Her respiration is 20 and oxygen saturation is 100%.  No significant changes. Lungs are clear to auscultation bilaterally. Heart has regular rate and rhythm. No palpable cervical, supraclavicular,  or axillary adenopathy. Abdomen soft, non-tender, normal bowel sounds. On pelvic examination the external genitalia were unremarkable. A speculum exam was performed. There are no mucosal lesions noted in the vaginal vault. On bimanual  examination there were no pelvic masses appreciated.  Vaginal cuff intact.  Lab Findings: Lab Results  Component Value Date   WBC 5.5 03/19/2020   HGB 10.0 (L) 03/19/2020   HCT 31.9 (L) 03/19/2020   MCV 92.2 03/19/2020   PLT 160 03/19/2020    Radiographic  Findings: No results found.  Impression: FIGO stage II (cT2, cN0, cM0) high-grade invasive clear cell adenocarcinomaof the endometrium  No evidence of recurrence on clinical exam today. Recent CT scan was stable.   Plan: The patient will follow up with Dr. Berline Lopes in three months and with radiation oncology in six months.  Total time spent in this encounter was 21 minutes which included reviewing the patient's most recent chemotherapy, follow-ups, CT scan of abdomen and pelvis, physical examination, and documentation.  ____________________________________   Blair Promise, PhD, MD  This document serves as a record of services personally performed by Gery Pray, MD. It was created on his behalf by Clerance Lav, a trained medical scribe. The creation of this record is based on the scribe's personal observations and the provider's statements to them. This document has been checked and approved by the attending provider.

## 2020-06-06 NOTE — Progress Notes (Signed)
Patient completed radiation to the endometrium back in July 2021.  Patient denies having any pain.  She reports just recently completing antibiotic/steroid and cough suppressant for bronchitis and sinus infection.  Negative Covid.    Patient denies having any issues with diarrhea/constipation/nausea/vomiting.  She does report some blood one times in stool but reports that she thinks she was straining.  That has resolved.  Reports using the vaginal dilator 3 times a week.  No discomfort with that.  Denies issues with urination.  Last pelvic with Dr. Berline Lopes.  Vitals:   06/06/20 0954  BP: 122/70  Pulse: 78  Resp: 20  Temp: 97.8 F (36.6 C)  SpO2: 100%  Weight: 272 lb (123.4 kg)  Height: 5\' 1"  (1.549 m)

## 2020-06-13 ENCOUNTER — Telehealth: Payer: Self-pay | Admitting: Oncology

## 2020-06-13 NOTE — Telephone Encounter (Signed)
Left a message regarding port removal.  Requested a return call.

## 2020-06-14 ENCOUNTER — Telehealth: Payer: Self-pay | Admitting: Oncology

## 2020-06-14 NOTE — Telephone Encounter (Signed)
Called [name] regarding having her port removed.  She still does want to have it removed.  Gave her IR's number to call and schedule.

## 2020-06-21 ENCOUNTER — Telehealth: Payer: Self-pay | Admitting: Oncology

## 2020-06-21 NOTE — Telephone Encounter (Signed)
[name] left a message to schedule an appointment with Dr. Berline Lopes in 3 months.  Called her back and left a message with appointment to follow up with Dr. Berline Lopes on 09/06/20 at 1:00.  Also reminded her to call to schedule to have her port removed.

## 2020-09-03 ENCOUNTER — Encounter: Payer: Self-pay | Admitting: Gynecologic Oncology

## 2020-09-05 NOTE — Progress Notes (Signed)
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 Possible 1.9 cm solid echogenic endometrial mass. In the setting of post-menopausal bleeding, endometrial sampling is indicated to exclude carcinoma. I 2. Enlarged uterus with at least 1 calcified fibroid at the fundus measuring 2.6 cm. 3. Heterogenous fluid within the endometrial canal, can be seen in the setting of cervical obstruction. 4. Nonvisualized ovaries   06/30/2019 Initial Biopsy   EMB: high grade adenocarcinoma   07/14/2019 Tumor Marker   CA-125: 354   07/21/2019 Imaging   CT A/P: IMPRESSION: 1. No gross extension of uterine carcinoma beyond the myometrium. 2. No pelvic lymphadenopathy. 3. No retroperitoneal periaortic adenopathy. 4. No visceral metastasis or skeletal metastasis. 5. Benign adenoma of the LEFT adrenal gland.  Benign renal cysts.   08/15/2019 Surgery   Robotic-assisted laparoscopic total hysterectomy with bilateral salpingoophorectomy, SLN injection, bilateral pelvic LND, mini-lap for specimen removal Modifier 22: significant obesity causing difficulty visualizing anatomy, increasing OR time    08/15/2019 Pathology Results   A. 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  - The surgical resection margins are negative for carcinoma.  - See oncology table below.   Myometrium: Leiomyomata.  Serosa: Unremarkable.  Bilateral adnexa: Benign ovaries and fallopian tubes  with endometriosis.   B. LYMPH NODES, RIGHT PELVIC, RESECTION:  - There is no evidence of carcinoma in 6 of 6 lymph nodes (0/6).   C. LYMPH NODES, LEFT PELVIC, RESECTION:  - There is no evidence of carcinoma in 7 of 7 lymph nodes (0/7).    ONCOLOGY TABLE:   UTERUS, CARCINOMA OR CARCINOSARCOMA   Procedure: Total hysterectomy and bilateral salpingo-oophorectomy  Histologic type: Clear cell adenocarcinoma  Histologic Grade: High-grade  Myometrial invasion:       Depth of invasion: 19.5 mm       Myometrial thickness: 20 mm  Uterine Serosa Involvement: Not definitively identified  Cervical stromal involvement: Present  Extent of involvement of other organs: Confined to lower uterine segment  Lymphovascular invasion: Not identified  Regional Lymph Nodes:       Examined:        0 Sentinel                               13 non-sentinel                               13 total        Lymph nodes with metastasis: 0  Representative Tumor Block: A17  MMR / MSI testing: Can be ordered upon clinician request.  Pathologic Stage Classification (pTNM, AJCC 8th edition):  pT2, pN0  (FIGO stage II)  Comments: Dr. Claudette Laws has reviewed selected slides and concurs with  the phenotype of this tumor.  Additional studies can be performed upon  clinician request.    08/15/2019 Cancer Staging   Staging form: Corpus Uteri - Carcinoma and Carcinosarcoma, AJCC 8th Edition - Clinical  stage from 08/15/2019: FIGO Stage II (cT2, cN0, cM0) - Signed by Lafonda Mosses, MD on 08/23/2019   09/18/2019 Imaging   Status post recent hysterectomy and bilateral salpingo-oophorectomy. Loculated fluid collections along the lateral pelvic sidewall may represent postoperative seroma or lymphocele. Superimposed infection or abscess are not excluded. Clinical correlation is recommended.   09/18/2019 Genetic Testing   PNegative genetic testing:  No pathogenic variants detected on the [facility] Common Hereditary Cancers Panel.  The report date is 09/18/2019.  The Common Hereditary Cancers Panel offered by [facility] includes sequencing and/or deletion duplication testing of the following 48 genes: APC, ATM, AXIN2, BARD1, BMPR1A, BRCA1, BRCA2, BRIP1, CDH1, CDK4, CDKN2A (p14ARF), CDKN2A (p16INK4a), CHEK2, CTNNA1, DICER1, EPCAM (Deletion/duplication testing only), GREM1 (promoter region deletion/duplication testing only), KIT, MEN1, MLH1, MSH2, MSH3, MSH6, MUTYH, NBN, NF1, NHTL1, PALB2, PDGFRA, PMS2, POLD1, POLE, PTEN, RAD50, RAD51C, RAD51D, RNF43, SDHB, SDHC, SDHD, SMAD4, SMARCA4. STK11, TP53, TSC1, TSC2, and VHL.  The following genes were evaluated for sequence changes only: SDHA and HOXB13 c.251G>A variant only.    09/20/2019 Procedure   Ultrasound and fluoroscopically guided right internal jugular single lumen power port catheter insertion. Tip in the SVC/RA junction. Catheter ready for use.   09/25/2019 - 10/23/2019 Chemotherapy   The patient had cisplatin for chemotherapy treatment.     09/26/2019 - 11/27/2019 Radiation Therapy   Site Technique Total Dose (Gy) Dose per Fx (Gy) Completed Fx Beam Energies  Vagina: Pelvis HDR-brachy 18/18 6 3/3 Ir-192  Uterus: Uterus IMRT 45/45 1.8 25/25 6X      12/18/2019 - 02/19/2020 Chemotherapy   The patient had carboplatin and taxol   03/19/2020 Imaging   1. Status post hysterectomy with bilateral salpingo oophorectomy since prior abdomen/pelvis CT of 07/21/2019. 2. Since pelvis CT 09/18/2019, interval decrease in size of the low-density lesions along the pelvic sidewalls bilaterally, compatible with resolving postoperative seromas. 3. 9 mm soft tissue nodule along the posterior right peritoneum, not definitely seen on prior imaging. Close attention on follow-up recommended. 4. Stable 12 mm left adrenal nodule, consistent with benign etiology.       Interval History: Saw Dr. Sondra Come on 1/20 and was NED at that time.  Reports doing well since her last visit with me.  Denies any vaginal  bleeding, discharge, pelvic or abdominal pain.  Reports regular bowel function.  Denies any changes to urinary function.  Endorses a good appetite without nausea or emesis.  Saw her primary care provider earlier this week for her annual exam.  Had updated mammogram at the end of last year.  Has been using vaginal dilators several times a week.  Past Medical/Surgical History: Past Medical History:  Diagnosis Date  . Arthritis    Spinal - inflammatory  . BMI 50.0-59.9, adult ([place])   . Endometrial cancer ([place])   . Family history of brain cancer   . Family history of cervical cancer   . Family history of colon cancer   . GERD (gastroesophageal reflux disease)   . Hypertension   . Morbid obesity ([place])   . Uterine cancer [facility])     Past Surgical History:  Procedure Laterality Date  . cardiac catherization  2011  . IR IMAGING GUIDED PORT INSERTION  09/20/2019  . LAPAROSCOPY  1996   scar tissue  . ROBOTIC ASSISTED TOTAL HYSTERECTOMY WITH BILATERAL SALPINGO OOPHERECTOMY Bilateral 08/15/2019   Procedure: XI ROBOTIC ASSISTED TOTAL HYSTERECTOMY WITH BILATERAL SALPINGO OOPHORECTOMY;  Surgeon: Lafonda Mosses, MD;  Location: [facility];  Service: Gynecology;  Laterality: Bilateral;  . SENTINEL NODE BIOPSY N/A 08/15/2019   Procedure: LYMPH NODE DISSECTION,LAPAROTOMY;  Surgeon: Lafonda Mosses, MD;  Location: [facility];  Service: Gynecology;  Laterality: N/A;  . TONSILLECTOMY    . WISDOM TOOTH EXTRACTION      Family History  Problem Relation Age of Onset  . Heart disease Father   . Thyroid disease Father   . Hypertension Mother   . COPD Mother   . Hypertension Half-Brother   . Thyroid disease Half-Sister   . COPD Half-Sister   . Hypertension Half-Sister   . Neuropathy Half-Sister   . Cervical cancer Paternal Aunt        dx. in her late 21s  . Brain cancer Paternal Uncle 59  . Cervical cancer Paternal Grandmother        dx. in her 64s  . Uterine cancer Neg Hx   . Breast cancer Neg Hx      Social History   Socioeconomic History  . Marital status: Married    Spouse name: Not on file  . Number of children: Not on file  . Years of education: Not on file  . Highest education level: Not on file  Occupational History  . Not on file  Tobacco Use  . Smoking status: Never Smoker  . Smokeless tobacco: Never Used  Vaping Use  . Vaping Use: Never used  Substance and Sexual Activity  . Alcohol use: Not Currently    Comment: rare glass of wine  . Drug use: Never  . Sexual activity: Not Currently  Other Topics Concern  . Not on file  Social History Narrative  . Not on file   Social Determinants of Health   Financial Resource Strain: Not on file  Food Insecurity: Not on file  Transportation Needs: Not on file  Physical Activity: Not on file  Stress: Not on file  Social Connections: Not on file    Current Medications:  Current Outpatient Medications:  .  acetaminophen (TYLENOL) 500 MG tablet, Take 500 mg by mouth every 6 (six) hours as needed., Disp: , Rfl:  .  amLODipine (NORVASC) 2.5 MG tablet, Take 2.5 mg by mouth daily., Disp: , Rfl:  .  hydrochlorothiazide (HYDRODIURIL) 25 MG tablet, Take 25 mg by mouth daily., Disp: , Rfl:  .  meloxicam (MOBIC) 15 MG tablet, Take 15 mg by mouth daily., Disp: , Rfl:  .  Multiple Vitamin (MULTIVITAMIN) tablet, Take 1 tablet by mouth daily., Disp: , Rfl:  .  rosuvastatin (CRESTOR) 20 MG tablet, Take 20 mg by mouth daily., Disp: , Rfl:   Review of Systems: Pertinent positives include back pain and headache.  Ringing in ears. Denies appetite changes, fevers, chills, fatigue, unexplained weight changes. Denies hearing loss, neck lumps or masses, mouth sores or voice changes. Denies cough or wheezing.  Denies shortness of breath. Denies chest pain or palpitations. Denies leg swelling. Denies abdominal distention, pain, blood in stools, constipation, diarrhea, nausea, vomiting, or early satiety. Denies pain with intercourse,  dysuria, frequency, hematuria or incontinence. Denies hot flashes, pelvic pain, vaginal bleeding or vaginal discharge.   Denies joint pain or muscle pain/cramps. Denies itching, rash, or wounds. Denies dizziness, numbness or seizures. Denies swollen lymph nodes or glands, denies easy bruising or bleeding. Denies anxiety, depression, confusion, or decreased concentration.  Physical Exam: BP 130/69 (BP Location: Left Arm, Patient Position: Sitting)   Pulse 80   Temp 97.6 F (36.4 C) (Oral)   Resp 20   Ht '5\' 1"'  (  1.549 m)   Wt 276 lb (125.2 kg)   SpO2 100%   BMI 52.15 kg/m  General: Alert, oriented, no acute distress. HEENT: Normocephalic, atraumatic, sclera anicteric. Chest: Unlabored breathing on room air. Abdomen: Obese, soft, nontender.  Normoactive bowel sounds.  No masses or hepatosplenomegaly appreciated.  Well-healed laparoscopic incisions. Extremities: Grossly normal range of motion.  Warm, well perfused.  No edema bilaterally. Skin: No rashes or lesions noted. Lymphatics: No cervical, supraclavicular, or inguinal adenopathy. GU: Normal appearing external genitalia without erythema, excoriation, or lesions.  Speculum exam reveals moderately atrophic vaginal mucosa, radiation changes noted at the apex with minimal agglutination towards the last 1 cm vagina.  There is what appears to be a ring of glandular tissue, no friability or bleeding associated with this.  Bimanual exam reveals no nodularity or masses.  Some agglutination at vaginal apex noted on bimanual.  Rectovaginal exam confirms findings.  Vaginal biopsy: Preoperative diagnosis: Vaginal changes in the setting of prior endometrial cancer history Postoperative diagnosis: Same as above Procedure: Vaginal biopsy Physician: Berline Lopes MD Specimens: Vaginal cuff biopsy Estimated blood loss: Minimal Procedure: The procedure was discussed with the patient including risks and she gave verbal consent.  Speculum was placed in the  vagina and the area intended for biopsy was visualized.  This was cleansed with Betadine x3.  Tischler forceps were then used to take a small biopsy of the vaginal cuff, which was placed in formalin to be sent to pathology.  No significant bleeding noted.  All instruments removed from the vagina.  Overall the patient tolerated the procedure well.  Laboratory & Radiologic Studies: None new  Assessment & Plan: Brittney Lamb is a 65 y.o. 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   Patient is overall doing very well.  She had some very mild changes within the vagina and I recommended biopsy to rule out recurrence.  My suspicion that this is cancer related is quite low and I think more likely changes due to her prior radiation.  I will call her with the biopsy results.  We discussed her CT back in November.  Given right peritoneal subcentimeter lesion that was noted, we discussed the possibility of a repeat CT scan about 6 months after (which would be May) to evaluate for any change.  If lesion has not seen or is similar in size and character, then I think we can discontinue any surveillance imaging.  Per NCCN and SGO surveillance recommendations, we will plan to see the patient every 3 months for 2 years. We discussed signs and symptoms that would be concerning for recurrence and the patient knows to call if she develops any of these.  She has scheduled to see Dr. Sondra Come in approximately 3 months.  32 minutes of total time was spent for this patient encounter, including preparation, face-to-face counseling with the patient and coordination of care, and documentation of the encounter.  Jeral Pinch, MD  Division of Gynecologic Oncology  Department of Obstetrics and Gynecology  [facility]

## 2020-09-06 ENCOUNTER — Other Ambulatory Visit: Payer: Self-pay

## 2020-09-06 ENCOUNTER — Encounter: Payer: Self-pay | Admitting: Gynecologic Oncology

## 2020-09-06 ENCOUNTER — Inpatient Hospital Stay: Payer: Self-pay | Attending: Gynecologic Oncology | Admitting: Gynecologic Oncology

## 2020-09-06 VITALS — BP 130/69 | HR 80 | Temp 97.6°F | Resp 20 | Ht 61.0 in | Wt 276.0 lb

## 2020-09-06 DIAGNOSIS — C541 Malignant neoplasm of endometrium: Secondary | ICD-10-CM | POA: Insufficient documentation

## 2020-09-06 DIAGNOSIS — Z90722 Acquired absence of ovaries, bilateral: Secondary | ICD-10-CM | POA: Insufficient documentation

## 2020-09-06 DIAGNOSIS — Z9221 Personal history of antineoplastic chemotherapy: Secondary | ICD-10-CM | POA: Insufficient documentation

## 2020-09-06 DIAGNOSIS — Z923 Personal history of irradiation: Secondary | ICD-10-CM | POA: Insufficient documentation

## 2020-09-06 DIAGNOSIS — Z90710 Acquired absence of both cervix and uterus: Secondary | ICD-10-CM | POA: Insufficient documentation

## 2020-09-06 DIAGNOSIS — N898 Other specified noninflammatory disorders of vagina: Secondary | ICD-10-CM

## 2020-09-06 NOTE — Addendum Note (Signed)
Addended by: Joylene John D on: 09/06/2020 02:45 PM   Modules accepted: Orders

## 2020-09-06 NOTE — Patient Instructions (Signed)
It was good to see you today!  I will call you with the biopsy that I took today when I get the results next week.  I have a very low suspicion that this is related to your cancer.  I will call you once I get the CT results that we will schedule in May.  You will see Dr. Sondra Come for follow-up in 3 months and I will see you back in 6 months.  My schedule is not out past June, so please call over the summer to make sure that we get you scheduled for a visit to see me in late October.  As always, if you have any changes to the way you are feeling, such as vaginal bleeding, pelvic pain, change to your bowel function, please call the clinic at [phone] to see me sooner.

## 2020-09-10 ENCOUNTER — Telehealth: Payer: Self-pay

## 2020-09-10 LAB — SURGICAL PATHOLOGY

## 2020-09-10 NOTE — Telephone Encounter (Signed)
LM for Ms Heaslip stating that the biopsy showed no cancer or pre cancer per Joylene John, NP. Keep appointments as scheduled.

## 2020-09-30 ENCOUNTER — Inpatient Hospital Stay: Payer: Self-pay | Attending: Gynecologic Oncology

## 2020-10-02 ENCOUNTER — Encounter (HOSPITAL_COMMUNITY): Payer: Self-pay

## 2020-10-02 ENCOUNTER — Ambulatory Visit (HOSPITAL_COMMUNITY)
Admission: RE | Admit: 2020-10-02 | Discharge: 2020-10-02 | Disposition: A | Discharge status: Home / Self Care | Payer: Self-pay | Source: Ambulatory Visit | Attending: Gynecologic Oncology | Admitting: Gynecologic Oncology

## 2020-10-02 ENCOUNTER — Other Ambulatory Visit: Payer: Self-pay

## 2020-10-02 DIAGNOSIS — C541 Malignant neoplasm of endometrium: Secondary | ICD-10-CM | POA: Insufficient documentation

## 2020-10-04 NOTE — Progress Notes (Signed)
I called the patient to review recent CT results. No obvious evidence of recurrent disease. No answer, left generic message. Also released the CT results with a note in my chart.  Valarie Cones MD

## 2020-11-27 ENCOUNTER — Encounter: Payer: Self-pay | Admitting: Radiology

## 2020-12-04 NOTE — Progress Notes (Signed)
Radiation Oncology         [phone] ________________________________  Name: Brittney Lamb MRN: [id number]  Date: 12/05/2020  DOB: 1956-02-08  Follow-Up Visit Note  CC: [name], MD  [name], MD    ICD-10-CM   1. Endometrial cancer ([facility])  C54.1       Diagnosis: FIGO stage II (cT2, cN0, cM0) high-grade invasive clear cell adenocarcinoma of the endometrium  Interval Since Last Radiation:  1 year and 9 days   Radiation Treatment Dates: 09/26/2019 through 11/27/2019 Site Technique Total Dose (Gy) Dose per Fx (Gy) Completed Fx Beam Energies  Vagina: Pelvis HDR-brachy 18/18 6 3/3 Ir-192  Uterus: Uterus IMRT 45/45 1.8 25/25 6X   Narrative:  The patient returns today for routine follow-up, the patient was last seen on 06/06/20.    The patient was last seen by Dr. Berline Lopes on 09/06/20 in which the patient was reported to be doing well and stated that she has been using her vaginal dilator several times a week. During this visit, Dr. Berline Lopes obtained a vaginal cuff biopsy, results from pathology revealed: benign squamous cell mucosa with focal inflammation. Also seen was a smaller crushed fragment with inflammation.       Pertinent imaging since the patient was last seen includes a chest CT taken on 10/02/20 which revealed no acute intra-abdominal pathology, and no explanation for the patient reported lower abdominal pain. Also seen was the stable right posterior peritoneal soft tissue nodule most likely representing postsurgical change of right salpingectomy. There was no definite evidence of recurrent or residual disease within the abdomen and pelvis. Also seen was a stable 15 mm left adrenal nodule, noted as indeterminate but likely benign (given its stability).    She reports no further abdominal pain.  She denies any pelvic pain or abdominal bloating.  She denies any vaginal bleeding or discharge.  She denies any urinary symptoms or rectal bleeding.  She continues to use her  vaginal dilator on a regular basis.  She reports no blood after using her dilator.               Allergies:  is allergic to lisinopril and saxenda [liraglutide -weight management].  Meds: Current Outpatient Medications  Medication Sig Dispense Refill   acetaminophen (TYLENOL) 500 MG tablet Take 500 mg by mouth every 6 (six) hours as needed.     amLODipine (NORVASC) 2.5 MG tablet Take 2.5 mg by mouth daily.     hydrochlorothiazide (HYDRODIURIL) 25 MG tablet Take 25 mg by mouth daily.     meloxicam (MOBIC) 15 MG tablet Take 15 mg by mouth daily.     Multiple Vitamin (MULTIVITAMIN) tablet Take 1 tablet by mouth daily.     rosuvastatin (CRESTOR) 20 MG tablet Take 20 mg by mouth daily.     No current facility-administered medications for this encounter.    Physical Findings: The patient is in no acute distress. Patient is alert and oriented.  height is 5\' 1"  (1.549 m) and weight is 277 lb 4 oz (125.8 kg). Her temporal temperature is 96.9 F (36.1 C) (abnormal). Her blood pressure is 139/77 and her pulse is 70. Her respiration is 18 and oxygen saturation is 100%. .  No significant changes. Lungs are clear to auscultation bilaterally. Heart has regular rate and rhythm. No palpable cervical, supraclavicular, or axillary adenopathy. Abdomen soft, non-tender, normal bowel sounds.  On pelvic examination the external genitalia were unremarkable.  A speculum exam was performed.  There is some  narrowing of the proximal vagina.  Radiation changes noted in the proximal vagina.  No mucosal lesions noted.  On bimanual and rectovaginal examination the vaginal cuff is intact.  No pelvic masses palpable.  Rectal sphincter tone normal.   Lab Findings: Lab Results  Component Value Date   WBC 5.5 03/19/2020   HGB 10.0 (L) 03/19/2020   HCT 31.9 (L) 03/19/2020   MCV 92.2 03/19/2020   PLT 160 03/19/2020    Radiographic Findings: No results found.  Impression:  FIGO stage II (cT2, cN0, cM0) high-grade  invasive clear cell adenocarcinoma of the endometrium  No evidence of recurrence on clinical exam today.  No apparent lasting effects from her external beam and vaginal brachytherapy treatments.  Plan: She will see Dr. Berline Lopes in 3 months.  Follow-up in radiation oncology in 6 months   20 minutes of total time was spent for this patient encounter, including preparation, face-to-face counseling with the patient and coordination of care, physical exam, and documentation of the encounter. ____________________________________  Blair Promise, PhD, MD   This document serves as a record of services personally performed by Gery Pray, MD. It was created on his behalf by Roney Mans, a trained medical scribe. The creation of this record is based on the scribe's personal observations and the provider's statements to them. This document has been checked and approved by the attending provider.

## 2020-12-05 ENCOUNTER — Ambulatory Visit
Admission: RE | Admit: 2020-12-05 | Discharge: 2020-12-05 | Disposition: A | Discharge status: Home / Self Care | Payer: Medicare HMO | Source: Ambulatory Visit | Attending: Radiation Oncology | Admitting: Radiation Oncology

## 2020-12-05 ENCOUNTER — Encounter: Payer: Self-pay | Admitting: Radiation Oncology

## 2020-12-05 ENCOUNTER — Other Ambulatory Visit: Payer: Self-pay

## 2020-12-05 DIAGNOSIS — Z79899 Other long term (current) drug therapy: Secondary | ICD-10-CM | POA: Insufficient documentation

## 2020-12-05 DIAGNOSIS — C541 Malignant neoplasm of endometrium: Secondary | ICD-10-CM

## 2020-12-05 DIAGNOSIS — Z923 Personal history of irradiation: Secondary | ICD-10-CM | POA: Diagnosis not present

## 2020-12-05 DIAGNOSIS — Z8542 Personal history of malignant neoplasm of other parts of uterus: Secondary | ICD-10-CM | POA: Insufficient documentation

## 2020-12-05 DIAGNOSIS — Z08 Encounter for follow-up examination after completed treatment for malignant neoplasm: Secondary | ICD-10-CM | POA: Diagnosis not present

## 2020-12-05 NOTE — Progress Notes (Signed)
Brittney Lamb is here today for follow up post radiation to the pelvic.  They completed their radiation on: 11/27/19   Does the patient complain of any of the following:  Pain:Patient denies pain.  Abdominal bloating: no Diarrhea/Constipation: no Nausea/Vomiting: Patient reports having some nausea.  Vaginal Discharge: no Blood in Urine or Stool: no Urinary Issues (dysuria/incomplete emptying/ incontinence/ increased frequency/urgency): no Does patient report using vaginal dilator 2-3 times a week and/or sexually active 2-3 weeks: Yes Post radiation skin changes: no   Additional comments if applicable:   Vitals:   12/05/20 0959  BP: 139/77  Pulse: 70  Resp: 18  Temp: (!) 96.9 F (36.1 C)  TempSrc: Temporal  SpO2: 100%  Weight: 277 lb 4 oz (125.8 kg)  Height: 5\' 1"  (1.549 m)

## 2020-12-10 ENCOUNTER — Telehealth: Payer: Self-pay | Admitting: *Deleted

## 2020-12-10 NOTE — Telephone Encounter (Signed)
CALLED PATIENT TO INFORM OF FU WITH DR. Berline Lopes ON 03-07-21- ARRIVAL TIME- 12:30 PM, LVM FOR A RETURN CALL

## 2021-01-09 DIAGNOSIS — R7301 Impaired fasting glucose: Secondary | ICD-10-CM | POA: Diagnosis not present

## 2021-01-09 DIAGNOSIS — Z6841 Body Mass Index (BMI) 40.0 and over, adult: Secondary | ICD-10-CM | POA: Diagnosis not present

## 2021-01-09 DIAGNOSIS — I10 Essential (primary) hypertension: Secondary | ICD-10-CM | POA: Diagnosis not present

## 2021-01-09 DIAGNOSIS — E7800 Pure hypercholesterolemia, unspecified: Secondary | ICD-10-CM | POA: Diagnosis not present

## 2021-02-03 ENCOUNTER — Telehealth: Payer: Self-pay | Admitting: *Deleted

## 2021-02-03 NOTE — Telephone Encounter (Signed)
Called and moved the patient's appt from 10/21 to 10/19

## 2021-03-03 ENCOUNTER — Encounter: Payer: Self-pay | Admitting: Gynecologic Oncology

## 2021-03-05 ENCOUNTER — Inpatient Hospital Stay: Payer: Medicare HMO

## 2021-03-05 ENCOUNTER — Inpatient Hospital Stay: Payer: Medicare HMO | Attending: Gynecologic Oncology | Admitting: Gynecologic Oncology

## 2021-03-05 ENCOUNTER — Encounter: Payer: Self-pay | Admitting: Gynecologic Oncology

## 2021-03-05 ENCOUNTER — Other Ambulatory Visit: Payer: Self-pay

## 2021-03-05 VITALS — BP 143/63 | HR 68 | Temp 98.2°F | Resp 16 | Ht 61.0 in | Wt 273.0 lb

## 2021-03-05 DIAGNOSIS — Z923 Personal history of irradiation: Secondary | ICD-10-CM | POA: Insufficient documentation

## 2021-03-05 DIAGNOSIS — Z9221 Personal history of antineoplastic chemotherapy: Secondary | ICD-10-CM | POA: Diagnosis not present

## 2021-03-05 DIAGNOSIS — Z90710 Acquired absence of both cervix and uterus: Secondary | ICD-10-CM | POA: Diagnosis not present

## 2021-03-05 DIAGNOSIS — Z8542 Personal history of malignant neoplasm of other parts of uterus: Secondary | ICD-10-CM

## 2021-03-05 DIAGNOSIS — C541 Malignant neoplasm of endometrium: Secondary | ICD-10-CM

## 2021-03-05 DIAGNOSIS — Z90722 Acquired absence of ovaries, bilateral: Secondary | ICD-10-CM | POA: Diagnosis not present

## 2021-03-05 LAB — CBC WITH DIFFERENTIAL (CANCER CENTER ONLY)
Abs Immature Granulocytes: 0.03 10*3/uL (ref 0.00–0.07)
Basophils Absolute: 0 10*3/uL (ref 0.0–0.1)
Basophils Relative: 1 %
Eosinophils Absolute: 0.2 10*3/uL (ref 0.0–0.5)
Eosinophils Relative: 3 %
HCT: 36.3 % (ref 36.0–46.0)
Hemoglobin: 11.9 g/dL — ABNORMAL LOW (ref 12.0–15.0)
Immature Granulocytes: 1 %
Lymphocytes Relative: 25 %
Lymphs Abs: 1.6 10*3/uL (ref 0.7–4.0)
MCH: 28.5 pg (ref 26.0–34.0)
MCHC: 32.8 g/dL (ref 30.0–36.0)
MCV: 87.1 fL (ref 80.0–100.0)
Monocytes Absolute: 0.7 10*3/uL (ref 0.1–1.0)
Monocytes Relative: 11 %
Neutro Abs: 3.8 10*3/uL (ref 1.7–7.7)
Neutrophils Relative %: 59 %
Platelet Count: 244 10*3/uL (ref 150–400)
RBC: 4.17 MIL/uL (ref 3.87–5.11)
RDW: 14.6 % (ref 11.5–15.5)
WBC Count: 6.3 10*3/uL (ref 4.0–10.5)
nRBC: 0 % (ref 0.0–0.2)

## 2021-03-05 LAB — COMPREHENSIVE METABOLIC PANEL
ALT: 13 U/L (ref 0–44)
AST: 16 U/L (ref 15–41)
Albumin: 3.9 g/dL (ref 3.5–5.0)
Alkaline Phosphatase: 71 U/L (ref 38–126)
Anion gap: 10 (ref 5–15)
BUN: 17 mg/dL (ref 8–23)
CO2: 27 mmol/L (ref 22–32)
Calcium: 9.6 mg/dL (ref 8.9–10.3)
Chloride: 104 mmol/L (ref 98–111)
Creatinine, Ser: 1.24 mg/dL — ABNORMAL HIGH (ref 0.44–1.00)
GFR, Estimated: 48 mL/min — ABNORMAL LOW (ref >60)
Glucose, Bld: 88 mg/dL (ref 70–99)
Potassium: 3.6 mmol/L (ref 3.5–5.1)
Sodium: 141 mmol/L (ref 135–145)
Total Bilirubin: 0.6 mg/dL (ref 0.3–1.2)
Total Protein: 7.5 g/dL (ref 6.5–8.1)

## 2021-03-05 NOTE — Patient Instructions (Signed)
It was good to see you today!  I do not see or feel any evidence of cancer recurrence on your exam.  Based on your urinary symptoms, we will plan to get a CT scan to make sure there is no evidence of recurrent cancer causing the symptoms.  If this is negative, then we can discuss by phone referral to urology for further work-up.  You are scheduled to see radiation oncology in January.  I will see you back in approximately 6 months.  Please call sometime after the first of the year once her schedule is released to schedule a visit with me in April.  As always, if you develop any new and concerning symptoms before your next scheduled visit, please call to see me sooner.

## 2021-03-05 NOTE — Progress Notes (Signed)
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 Possible 1.9 cm solid echogenic endometrial mass. In the setting of post-menopausal bleeding, endometrial sampling is indicated to exclude carcinoma. I 2. Enlarged uterus with at least 1 calcified fibroid at the fundus measuring 2.6 cm. 3. Heterogenous fluid within the endometrial canal, can be seen in the setting of cervical obstruction. 4. Nonvisualized ovaries   06/30/2019 Initial Biopsy   EMB: high grade adenocarcinoma   07/14/2019 Tumor Marker   CA-125: 354   07/21/2019 Imaging   CT A/P: IMPRESSION: 1. No gross extension of uterine carcinoma beyond the myometrium. 2. No pelvic lymphadenopathy. 3. No retroperitoneal periaortic adenopathy. 4. No visceral metastasis or skeletal metastasis. 5. Benign adenoma of the LEFT adrenal gland.  Benign renal cysts.   08/15/2019 Surgery   Robotic-assisted laparoscopic total hysterectomy with bilateral salpingoophorectomy, SLN injection, bilateral pelvic LND, mini-lap for specimen removal Modifier 22: significant obesity causing difficulty visualizing anatomy, increasing OR time    08/15/2019 Pathology Results   A. 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  - The surgical resection margins are negative for carcinoma.  - See oncology table below.   Myometrium: Leiomyomata.  Serosa: Unremarkable.  Bilateral adnexa: Benign ovaries and fallopian tubes  with endometriosis.   B. LYMPH NODES, RIGHT PELVIC, RESECTION:  - There is no evidence of carcinoma in 6 of 6 lymph nodes (0/6).   C. LYMPH NODES, LEFT PELVIC, RESECTION:  - There is no evidence of carcinoma in 7 of 7 lymph nodes (0/7).    ONCOLOGY TABLE:   UTERUS, CARCINOMA OR CARCINOSARCOMA   Procedure: Total hysterectomy and bilateral salpingo-oophorectomy  Histologic type: Clear cell adenocarcinoma  Histologic Grade: High-grade  Myometrial invasion:       Depth of invasion: 19.5 mm       Myometrial thickness: 20 mm  Uterine Serosa Involvement: Not definitively identified  Cervical stromal involvement: Present  Extent of involvement of other organs: Confined to lower uterine segment  Lymphovascular invasion: Not identified  Regional Lymph Nodes:       Examined:        0 Sentinel                               13 non-sentinel                               13 total        Lymph nodes with metastasis: 0  Representative Tumor Block: A17  MMR / MSI testing: Can be ordered upon clinician request.  Pathologic Stage Classification (pTNM, AJCC 8th edition):  pT2, pN0  (FIGO stage II)  Comments: Dr. Claudette Laws has reviewed selected slides and concurs with  the phenotype of this tumor.  Additional studies can be performed upon  clinician request.    08/15/2019 Cancer Staging   Staging form: Corpus Uteri - Carcinoma and Carcinosarcoma, AJCC 8th Edition - Clinical stage  from 08/15/2019: FIGO Stage II (cT2, cN0, cM0) - Signed by Lafonda Mosses, MD on 08/23/2019   09/18/2019 Imaging   Status post recent hysterectomy and bilateral salpingo-oophorectomy. Loculated fluid collections along the lateral pelvic sidewall may represent postoperative seroma or lymphocele. Superimposed infection or abscess are not excluded. Clinical correlation is recommended.   09/18/2019 Genetic Testing   PNegative genetic testing:  No pathogenic variants detected on the [facility] Common Hereditary Cancers Panel.  The report date is 09/18/2019.  The Common Hereditary Cancers Panel offered by [facility] includes sequencing and/or deletion duplication testing of the following 48 genes: APC, ATM, AXIN2, BARD1, BMPR1A, BRCA1, BRCA2, BRIP1, CDH1, CDK4, CDKN2A (p14ARF), CDKN2A (p16INK4a), CHEK2, CTNNA1, DICER1, EPCAM (Deletion/duplication testing only), GREM1 (promoter region deletion/duplication testing only), KIT, MEN1, MLH1, MSH2, MSH3, MSH6, MUTYH, NBN, NF1, NHTL1, PALB2, PDGFRA, PMS2, POLD1, POLE, PTEN, RAD50, RAD51C, RAD51D, RNF43, SDHB, SDHC, SDHD, SMAD4, SMARCA4. STK11, TP53, TSC1, TSC2, and VHL.  The following genes were evaluated for sequence changes only: SDHA and HOXB13 c.251G>A variant only.    09/20/2019 Procedure   Ultrasound and fluoroscopically guided right internal jugular single lumen power port catheter insertion. Tip in the SVC/RA junction. Catheter ready for use.   09/25/2019 - 10/23/2019 Chemotherapy   The patient had cisplatin for chemotherapy treatment.     09/26/2019 - 11/27/2019 Radiation Therapy   Site Technique Total Dose (Gy) Dose per Fx (Gy) Completed Fx Beam Energies  Vagina: Pelvis HDR-brachy 18/18 6 3/3 Ir-192  Uterus: Uterus IMRT 45/45 1.8 25/25 6X      12/18/2019 - 02/19/2020 Chemotherapy   The patient had carboplatin and taxol   03/19/2020 Imaging   1. Status post hysterectomy with bilateral salpingo oophorectomy since prior abdomen/pelvis CT of 07/21/2019. 2. Since pelvis CT 09/18/2019, interval decrease in size of the low-density lesions along the pelvic sidewalls bilaterally, compatible with resolving postoperative seromas. 3. 9 mm soft tissue nodule along the posterior right peritoneum, not definitely seen on prior imaging. Close attention on follow-up recommended. 4. Stable 12 mm left adrenal nodule, consistent with benign etiology.       Interval History: Patient presents today for surveillance visit.  She notes overall doing well.  She denies any vaginal bleeding or  discharge.  She endorses a good appetite without nausea, emesis, bloating, early satiety.  She endorses regular bowel function.  She has some urinary symptoms that are similar to right after surgery.  This does not happen every time she urinates but sometimes she has pressure and pain when she urinates.  If she sits on the toilet for long enough, she will often empty her bladder a little bit more and then her symptoms resolved.  She denies any other urinary symptoms.  She endorses some trouble remembering to use her vaginal dilator, thinks that she does this about once a week.  Denies any associated vaginal bleeding when she uses it.  Her sister-in-law who is currently undergoing cancer treatment here at [facility] is currently in the ICU.  Past Medical/Surgical History: Past Medical History:  Diagnosis Date   Arthritis    Spinal - inflammatory   BMI 50.0-59.9, adult ([place])    Endometrial cancer ([place]) dx'd 06/2019   Family history of brain cancer    Family history of cervical cancer    Family history of colon cancer    GERD (gastroesophageal reflux disease)    History of radiation therapy 10/31/2019   IMRT uterus  09/26/2019-10/31/2019   Dr Gery Pray   History  of radiation therapy 11/27/2019   vaginal brachytherapy  11/14/2019-11/27/2019   Dr Gery Pray   Hypertension    Morbid obesity [facility])    Uterine cancer [facility])     Past Surgical History:  Procedure Laterality Date   cardiac catherization  2011   IR IMAGING GUIDED PORT INSERTION  09/20/2019   LAPAROSCOPY  1996   scar tissue   ROBOTIC ASSISTED TOTAL HYSTERECTOMY WITH BILATERAL SALPINGO OOPHERECTOMY Bilateral 08/15/2019   Procedure: XI ROBOTIC ASSISTED TOTAL HYSTERECTOMY WITH BILATERAL SALPINGO OOPHORECTOMY;  Surgeon: Lafonda Mosses, MD;  Location: [facility];  Service: Gynecology;  Laterality: Bilateral;   SENTINEL NODE BIOPSY N/A 08/15/2019   Procedure: LYMPH NODE DISSECTION,LAPAROTOMY;  Surgeon: Lafonda Mosses,  MD;  Location: [facility];  Service: Gynecology;  Laterality: N/A;   TONSILLECTOMY     WISDOM TOOTH EXTRACTION      Family History  Problem Relation Age of Onset   Heart disease Father    Thyroid disease Father    Hypertension Mother    COPD Mother    Hypertension Half-Brother    Thyroid disease Half-Sister    COPD Half-Sister    Hypertension Half-Sister    Neuropathy Half-Sister    Cervical cancer Paternal Aunt        dx. in her late 38s   Brain cancer Paternal Uncle 15   Cervical cancer Paternal Grandmother        dx. in her 29s   Uterine cancer Neg Hx    Breast cancer Neg Hx     Social History   Socioeconomic History   Marital status: Single    Spouse name: Not on file   Number of children: Not on file   Years of education: Not on file   Highest education level: Not on file  Occupational History   Not on file  Tobacco Use   Smoking status: Never   Smokeless tobacco: Never  Vaping Use   Vaping Use: Never used  Substance and Sexual Activity   Alcohol use: Not Currently    Comment: rare glass of wine   Drug use: Never   Sexual activity: Not Currently  Other Topics Concern   Not on file  Social History Narrative   Not on file   Social Determinants of Health   Financial Resource Strain: Not on file  Food Insecurity: Not on file  Transportation Needs: Not on file  Physical Activity: Not on file  Stress: Not on file  Social Connections: Not on file    Current Medications:  Current Outpatient Medications:    acetaminophen (TYLENOL) 500 MG tablet, Take 500 mg by mouth every 6 (six) hours as needed., Disp: , Rfl:    amLODipine (NORVASC) 2.5 MG tablet, Take 2.5 mg by mouth daily., Disp: , Rfl:    hydrochlorothiazide (HYDRODIURIL) 25 MG tablet, Take 25 mg by mouth daily., Disp: , Rfl:    meloxicam (MOBIC) 15 MG tablet, Take 15 mg by mouth daily., Disp: , Rfl:    Multiple Vitamin (MULTIVITAMIN) tablet, Take 1 tablet by mouth daily., Disp: , Rfl:    rosuvastatin  (CRESTOR) 20 MG tablet, Take 20 mg by mouth daily., Disp: , Rfl:   Review of Systems: Denies appetite changes, fevers, chills, fatigue, unexplained weight changes. Denies hearing loss, neck lumps or masses, mouth sores, ringing in ears or voice changes. Denies cough or wheezing.  Denies shortness of breath. Denies chest pain or palpitations. Denies leg swelling. Denies abdominal distention, pain, blood in stools, constipation, diarrhea,  nausea, vomiting, or early satiety. Denies pain with intercourse, dysuria, frequency, hematuria or incontinence. Denies hot flashes, pelvic pain, vaginal bleeding or vaginal discharge.   Denies joint pain, back pain or muscle pain/cramps. Denies itching, rash, or wounds. Denies dizziness, headaches, numbness or seizures. Denies swollen lymph nodes or glands, denies easy bruising or bleeding. Denies anxiety, depression, confusion, or decreased concentration.  Physical Exam: BP (!) 143/63 (BP Location: Left Arm, Patient Position: Sitting)   Pulse 68   Temp 98.2 F (36.8 C) (Oral)   Resp 16   Ht _0  (1.549 m)   Wt 273 lb (123.8 kg)   SpO2 100%   BMI 51.58 kg/m  General: Alert, oriented, no acute distress. HEENT: Normocephalic, atraumatic, sclera anicteric. Chest: Clear to auscultation bilaterally.  No wheezes or rhonchi. Cardiovascular: Regular rate and rhythm, no murmurs. Abdomen: Obese, soft, nontender.  Normoactive bowel sounds.  No masses or hepatosplenomegaly appreciated.  Well-healed mini lap and laparoscopic incisions. Extremities: Grossly normal range of motion.  Warm, well perfused.  No edema bilaterally. Skin: No rashes or lesions noted. Lymphatics: No cervical, supraclavicular, or inguinal adenopathy. GU: Normal appearing external genitalia without erythema, excoriation, or lesions.  Speculum exam reveals mildly atrophic vaginal mucosa.  Radiation changes noted at the apex, right more than left.  Bimanual exam reveals cuff is intact, no  masses or nodularity.  Rectovaginal exam confirms these findings.  Laboratory & Radiologic Studies: None new  Assessment & Plan: Brittney Lamb is a 65 y.o. 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  Patient is overall doing well and is NED on exam today.  I continue to see changes within the vagina that are similar to her last visit.  I had previously biopsied them and they were negative for any dysplasia or malignancy.    Given urinary symptoms with her high risk cancer history, I recommended that we get a CT scan today to make sure that there is no intra-abdominal or pelvic finding causing the symptoms.  If CT scan is negative, we discussed possible referral to urology for work-up of her symptoms.     Per NCCN and SGO surveillance recommendations, we will plan to see the patient every 3 months for 2 years.  We discussed signs and symptoms that would be concerning for recurrence and the patient knows to call if she develops any of these.  She has scheduled to see Dr. Sondra Come in approximately 3 months and she will return to see me next April.  32 minutes of total time was spent for this patient encounter, including preparation, face-to-face counseling with the patient and coordination of care, and documentation of the encounter.  Jeral Pinch, MD  Division of Gynecologic Oncology  Department of Obstetrics and Gynecology  [facility]

## 2021-03-07 ENCOUNTER — Ambulatory Visit: Payer: Medicare HMO | Admitting: Gynecologic Oncology

## 2021-03-14 ENCOUNTER — Ambulatory Visit (HOSPITAL_COMMUNITY)
Admission: RE | Admit: 2021-03-14 | Discharge: 2021-03-14 | Disposition: A | Discharge status: Home / Self Care | Payer: Medicare HMO | Source: Ambulatory Visit | Attending: Gynecologic Oncology | Admitting: Gynecologic Oncology

## 2021-03-14 ENCOUNTER — Encounter (HOSPITAL_COMMUNITY): Payer: Self-pay

## 2021-03-14 ENCOUNTER — Other Ambulatory Visit: Payer: Self-pay

## 2021-03-14 DIAGNOSIS — Z90710 Acquired absence of both cervix and uterus: Secondary | ICD-10-CM | POA: Diagnosis not present

## 2021-03-14 DIAGNOSIS — N281 Cyst of kidney, acquired: Secondary | ICD-10-CM | POA: Diagnosis not present

## 2021-03-14 DIAGNOSIS — C541 Malignant neoplasm of endometrium: Secondary | ICD-10-CM | POA: Insufficient documentation

## 2021-03-14 MED ORDER — IOHEXOL 350 MG/ML SOLN
75.0000 mL | Freq: Once | INTRAVENOUS | Status: AC | PRN
  Administered 2021-03-14: 75 mL via INTRAVENOUS

## 2021-03-18 ENCOUNTER — Other Ambulatory Visit: Payer: Self-pay | Admitting: Hematology and Oncology

## 2021-03-18 ENCOUNTER — Telehealth: Payer: Self-pay | Admitting: Oncology

## 2021-03-18 ENCOUNTER — Encounter: Payer: Self-pay | Admitting: Oncology

## 2021-03-18 DIAGNOSIS — C541 Malignant neoplasm of endometrium: Secondary | ICD-10-CM

## 2021-03-18 NOTE — Telephone Encounter (Signed)
Left a message regarding port removal.  Requested a return call.

## 2021-03-18 NOTE — Telephone Encounter (Signed)
[name] called back and does want to schedule her port removal appointment.  Advised her that I will call her back with the appointment.

## 2021-03-18 NOTE — Progress Notes (Signed)
Entered new order for Port removal to be scheduled for 04/15/21. Previous order will expire on 03/21/21.

## 2021-03-19 ENCOUNTER — Telehealth: Payer: Self-pay | Admitting: Oncology

## 2021-03-19 NOTE — Telephone Encounter (Signed)
Left a message regarding Brittney Lamb's good CT results.  Requested a return call to confirm.

## 2021-03-19 NOTE — Telephone Encounter (Signed)
[name] called back and confirmed that she received my message about her CT scan.  She did not have any further questions.

## 2021-04-14 DIAGNOSIS — Z01419 Encounter for gynecological examination (general) (routine) without abnormal findings: Secondary | ICD-10-CM | POA: Diagnosis not present

## 2021-04-14 DIAGNOSIS — I10 Essential (primary) hypertension: Secondary | ICD-10-CM | POA: Diagnosis not present

## 2021-04-14 DIAGNOSIS — E7800 Pure hypercholesterolemia, unspecified: Secondary | ICD-10-CM | POA: Diagnosis not present

## 2021-04-14 DIAGNOSIS — Z2821 Immunization not carried out because of patient refusal: Secondary | ICD-10-CM | POA: Diagnosis not present

## 2021-04-14 DIAGNOSIS — R202 Paresthesia of skin: Secondary | ICD-10-CM | POA: Diagnosis not present

## 2021-04-14 DIAGNOSIS — Z6841 Body Mass Index (BMI) 40.0 and over, adult: Secondary | ICD-10-CM | POA: Diagnosis not present

## 2021-04-14 DIAGNOSIS — Z23 Encounter for immunization: Secondary | ICD-10-CM | POA: Diagnosis not present

## 2021-04-15 ENCOUNTER — Other Ambulatory Visit (HOSPITAL_COMMUNITY): Payer: Medicare HMO

## 2021-04-22 DIAGNOSIS — H52223 Regular astigmatism, bilateral: Secondary | ICD-10-CM | POA: Diagnosis not present

## 2021-05-02 ENCOUNTER — Other Ambulatory Visit: Payer: Self-pay

## 2021-05-02 ENCOUNTER — Ambulatory Visit (HOSPITAL_COMMUNITY)
Admission: RE | Admit: 2021-05-02 | Discharge: 2021-05-02 | Disposition: A | Discharge status: Home / Self Care | Payer: Medicare HMO | Source: Ambulatory Visit | Attending: Hematology and Oncology | Admitting: Hematology and Oncology

## 2021-05-02 DIAGNOSIS — Z452 Encounter for adjustment and management of vascular access device: Secondary | ICD-10-CM | POA: Insufficient documentation

## 2021-05-02 DIAGNOSIS — C541 Malignant neoplasm of endometrium: Secondary | ICD-10-CM | POA: Diagnosis not present

## 2021-05-02 HISTORY — PX: IR REMOVAL TUN ACCESS W/ PORT W/O FL MOD SED: IMG2290

## 2021-05-02 MED ORDER — LIDOCAINE-EPINEPHRINE (PF) 2 %-1:200000 IJ SOLN
INTRAMUSCULAR | Status: AC
  Filled 2021-05-02: qty 20

## 2021-05-02 MED ORDER — LIDOCAINE-EPINEPHRINE (PF) 2 %-1:200000 IJ SOLN
INTRAMUSCULAR | Status: DC | PRN
  Administered 2021-05-02: 10 mL

## 2021-05-15 DIAGNOSIS — Z1231 Encounter for screening mammogram for malignant neoplasm of breast: Secondary | ICD-10-CM | POA: Diagnosis not present

## 2021-05-29 DIAGNOSIS — Z0100 Encounter for examination of eyes and vision without abnormal findings: Secondary | ICD-10-CM | POA: Diagnosis not present

## 2021-06-05 ENCOUNTER — Telehealth: Payer: Self-pay | Admitting: *Deleted

## 2021-06-05 NOTE — Telephone Encounter (Signed)
CALLED PATIENT TO ALTER FU ON 06-12-21 DUE TO DR. KINARD BEING IN THE OR, RESCHEDULED FOR 06-26-21 2 8  AM, LVM FOR A RETURN CALL

## 2021-06-12 ENCOUNTER — Ambulatory Visit: Payer: Self-pay | Admitting: Radiation Oncology

## 2021-06-26 ENCOUNTER — Ambulatory Visit: Payer: Medicare HMO | Admitting: Radiation Oncology

## 2021-07-16 NOTE — Progress Notes (Signed)
?Radiation Oncology         [phone] ?________________________________ ? ?Name: Brittney Lamb MRN: [id number]  ?Date: 07/17/2021  DOB: Nov 15, 1955 ? ?Follow-Up Visit Note ? ?CC: [name], MD  [name], MD ? ?  ICD-10-CM   ?1. Endometrial cancer ([place])  C54.1   ?  ? ? ?Diagnosis: FIGO stage II (cT2, cN0, cM0) high-grade invasive clear cell adenocarcinoma of the endometrium ? ?Interval Since Last Radiation: 1 year, 7 months, and 18 days  ? ?Radiation Treatment Dates: 09/26/2019 through 11/27/2019 ?Site Technique Total Dose (Gy) Dose per Fx (Gy) Completed Fx Beam Energies  ?Vagina: Pelvis HDR-brachy 18/18 6 3/3 Ir-192  ?Uterus: Pelvis IMRT 45/45 1.8 25/25 6X  ? ? ?Narrative:  The patient returns today for routine 6 month follow-up, she was last seen here for follow-up on 12/05/20.  Since her last visit, the patient followed up with Dr. Berline Lopes on 03/05/21. During which time, the patient endorsed some urinary symptoms including pressure and pain when she urinates, which the patient noted as similar to right after her past surgery. Otherwise, the patient denied any other urinary symptoms. The patient was also noted to report trouble remembering to use her vaginal dilator. Given the patients urinary symptoms and her high risk cancer history, Dr. Berline Lopes recommended CT imaging to make sure that there is no intra-abdominal or pelvic findings causing her symptoms.   ? ?Subsequent CT of the abdomen and pelvis on 03/14/21 demonstrated no acute findings or evidence of recurrent or metastatic carcinoma within the abdomen or pelvis. ? ?She denies any further urinary symptoms at this time.  He denies any abdominal bloating pelvic pain vaginal bleeding or discharge.  She is using her vaginal dilator approximately once a week. ?                             ? ?Allergies:  is allergic to lisinopril and saxenda [liraglutide -weight management]. ? ?Meds: ?Current Outpatient Medications  ?Medication Sig Dispense Refill  ?  acetaminophen (TYLENOL) 500 MG tablet Take 500 mg by mouth every 6 (six) hours as needed.    ? amLODipine (NORVASC) 2.5 MG tablet Take 2.5 mg by mouth daily.    ? hydrochlorothiazide (HYDRODIURIL) 25 MG tablet Take 25 mg by mouth daily.    ? meloxicam (MOBIC) 15 MG tablet Take 15 mg by mouth daily.    ? Multiple Vitamin (MULTIVITAMIN) tablet Take 1 tablet by mouth daily.    ? rosuvastatin (CRESTOR) 20 MG tablet Take 20 mg by mouth daily.    ? ?No current facility-administered medications for this encounter.  ? ? ?Physical Findings: ?The patient is in no acute distress. Patient is alert and oriented. ? height is 5\' 1"  (1.549 m) and weight is 272 lb (123.4 kg). Her temporal temperature is 96.5 ?F (35.8 ?C) (abnormal). Her blood pressure is 140/79 and her pulse is 70. Her respiration is 18 and oxygen saturation is 100%. .   Lungs are clear to auscultation bilaterally. Heart has regular rate and rhythm. No palpable cervical, supraclavicular, or axillary adenopathy. Abdomen soft, non-tender, normal bowel sounds. ? ?On pelvic examination the external genitalia were unremarkable. A speculum exam was performed. There are no mucosal lesions noted in the vaginal vault. On bimanual and rectovaginal examination there were no pelvic masses appreciated.  Vaginal cuff intact.  Mild radiation changes noted at the vaginal cuff. ? ? ? ?Lab Findings: ?Lab Results  ?Component Value Date  ?  WBC 6.3 03/05/2021  ? HGB 11.9 (L) 03/05/2021  ? HCT 36.3 03/05/2021  ? MCV 87.1 03/05/2021  ? PLT 244 03/05/2021  ? ? ?Radiographic Findings: ?No results found. ? 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 ?No evidence of recurrence on clinical exam today.  She does not appear to be experiencing any side effects after her pelvic radiation therapy and vaginal brachytherapy. ? ?Plan: She will follow-up with Dr. Berline Lopes in early June.  Follow-up in radiation oncology in early  September. ? ? ?20 minutes of total time was spent for this patient encounter, including preparation, face-to-face counseling with the patient and coordination of care, physical exam, and documentation of the encounter. ?____________________________________ ? ?Blair Promise, PhD, MD ? ? ?This document serves as a record of services personally performed by Gery Pray, MD. It was created on his behalf by Roney Mans, a trained medical scribe. The creation of this record is based on the scribe's personal observations and the provider's statements to them. This document has been checked and approved by the attending provider. ? ?

## 2021-07-17 ENCOUNTER — Other Ambulatory Visit: Payer: Self-pay

## 2021-07-17 ENCOUNTER — Ambulatory Visit
Admission: RE | Admit: 2021-07-17 | Discharge: 2021-07-17 | Disposition: A | Discharge status: Home / Self Care | Payer: Medicare HMO | Source: Ambulatory Visit | Attending: Radiation Oncology | Admitting: Radiation Oncology

## 2021-07-17 VITALS — BP 140/79 | HR 70 | Temp 96.5°F | Resp 18 | Ht 61.0 in | Wt 272.0 lb

## 2021-07-17 DIAGNOSIS — Z79899 Other long term (current) drug therapy: Secondary | ICD-10-CM | POA: Insufficient documentation

## 2021-07-17 DIAGNOSIS — Z9221 Personal history of antineoplastic chemotherapy: Secondary | ICD-10-CM | POA: Insufficient documentation

## 2021-07-17 DIAGNOSIS — Z8542 Personal history of malignant neoplasm of other parts of uterus: Secondary | ICD-10-CM | POA: Insufficient documentation

## 2021-07-17 DIAGNOSIS — Z791 Long term (current) use of non-steroidal anti-inflammatories (NSAID): Secondary | ICD-10-CM | POA: Diagnosis not present

## 2021-07-17 DIAGNOSIS — Z923 Personal history of irradiation: Secondary | ICD-10-CM | POA: Insufficient documentation

## 2021-07-17 DIAGNOSIS — Z08 Encounter for follow-up examination after completed treatment for malignant neoplasm: Secondary | ICD-10-CM | POA: Diagnosis not present

## 2021-07-17 DIAGNOSIS — C541 Malignant neoplasm of endometrium: Secondary | ICD-10-CM

## 2021-07-17 NOTE — Progress Notes (Signed)
Brittney Lamb is here today for follow up post radiation to the pelvic. ?  ?They completed their radiation on: 11/27/19  ?  ?Does the patient complain of any of the following: ? ?Pain: Patient denies ?Abdominal bloating: Patient denies  ?Diarrhea/Constipation: Patient denies; reports regular, daily bowel movements ?Nausea/Vomiting: Pateint denies and reports a stable appetite ?Wt Readings from Last 3 Encounters:  ?07/17/21 272 lb (123.4 kg)  ?03/05/21 273 lb (123.8 kg)  ?12/05/20 277 lb 4 oz (125.8 kg)  ? ?Vaginal discharge: Pateint denies as well as no vaginal itching ?Blood in urine or stool: Patient denies ?Urinary issues: Patient denies any dysuria, incomplete empyting, urgency/frequency, or leakage/incontinence  ?Dilators: Confirms she's using 2-3 times a week; denies any pain or discomfort with use ?Post radiation skin changes: Patient denies ? ?Additional comments if applicable: Nothing else of note ? ?BP 140/79 (BP Location: Left Arm, Patient Position: Sitting)   Pulse 70   Temp (!) 96.5 ?F (35.8 ?C) (Temporal)   Resp 18   Ht 5\' 1"  (1.549 m)   Wt 272 lb (123.4 kg)   SpO2 100%   BMI 51.39 kg/m?  ? ?

## 2021-08-21 ENCOUNTER — Telehealth: Payer: Self-pay | Admitting: *Deleted

## 2021-08-21 NOTE — Telephone Encounter (Signed)
Brittney Lamb from radiation called and scheduled the patient for a follow up appt. Appt scheduled and [name] will contact the patient for the appt on  6/1 at 2 pm  ?

## 2021-08-21 NOTE — Telephone Encounter (Signed)
CALLED PATIENT TO INFORM OF FU APPT. WITH DR.TUCKER ON 10-16-21 - ARRIVAL TIME- 1:30 PM, LVM FOR A RETURN CALL ?

## 2021-10-08 ENCOUNTER — Encounter: Payer: Self-pay | Admitting: Gynecologic Oncology

## 2021-10-09 DIAGNOSIS — Z6841 Body Mass Index (BMI) 40.0 and over, adult: Secondary | ICD-10-CM | POA: Diagnosis not present

## 2021-10-09 DIAGNOSIS — I10 Essential (primary) hypertension: Secondary | ICD-10-CM | POA: Diagnosis not present

## 2021-10-09 DIAGNOSIS — R7301 Impaired fasting glucose: Secondary | ICD-10-CM | POA: Diagnosis not present

## 2021-10-09 DIAGNOSIS — E7800 Pure hypercholesterolemia, unspecified: Secondary | ICD-10-CM | POA: Diagnosis not present

## 2021-10-16 ENCOUNTER — Other Ambulatory Visit: Payer: Self-pay

## 2021-10-16 ENCOUNTER — Inpatient Hospital Stay: Payer: Medicare HMO | Attending: Gynecologic Oncology | Admitting: Gynecologic Oncology

## 2021-10-16 ENCOUNTER — Encounter: Payer: Self-pay | Admitting: Gynecologic Oncology

## 2021-10-16 VITALS — BP 125/57 | HR 80 | Temp 98.4°F | Resp 16 | Ht 61.0 in | Wt 281.0 lb

## 2021-10-16 DIAGNOSIS — C541 Malignant neoplasm of endometrium: Secondary | ICD-10-CM

## 2021-10-16 DIAGNOSIS — Z90710 Acquired absence of both cervix and uterus: Secondary | ICD-10-CM | POA: Insufficient documentation

## 2021-10-16 DIAGNOSIS — Z90722 Acquired absence of ovaries, bilateral: Secondary | ICD-10-CM | POA: Diagnosis not present

## 2021-10-16 DIAGNOSIS — Z9221 Personal history of antineoplastic chemotherapy: Secondary | ICD-10-CM | POA: Diagnosis not present

## 2021-10-16 DIAGNOSIS — Z8542 Personal history of malignant neoplasm of other parts of uterus: Secondary | ICD-10-CM | POA: Diagnosis not present

## 2021-10-16 DIAGNOSIS — Z6841 Body Mass Index (BMI) 40.0 and over, adult: Secondary | ICD-10-CM | POA: Insufficient documentation

## 2021-10-16 DIAGNOSIS — Z923 Personal history of irradiation: Secondary | ICD-10-CM | POA: Diagnosis not present

## 2021-10-16 NOTE — Patient Instructions (Addendum)
It was good to see you today.  I do not see or feel any evidence of cancer recurrence on your exam.  We will continue with visits every 3 months until the end of this year, 2 years after you will have completed treatment.  I will plan to see you back in 6 months in late November or early December.  Please call back sometime in September to make that visit.  As always, if you develop new symptoms before then, please call to see me sooner.  The apps that we talked about to help you track food and exercise are lose it!  And my fitness pal.  I would work on increasing fluid intake and using something regularly like Metamucil.  Please let me know if your constipation does not improve.

## 2021-10-16 NOTE — Progress Notes (Signed)
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)  04/17/2019 Initial Diagnosis   She presented with abnormal post menopausal bleeding   06/12/2019 Imaging   US pelvis 1. Possible 1.9 cm solid echogenic endometrial mass. In the setting of post-menopausal bleeding, endometrial sampling is indicated to exclude carcinoma. I 2. Enlarged uterus with at least 1 calcified fibroid at the fundus measuring 2.6 cm. 3. Heterogenous fluid within the endometrial canal, can be seen in the setting of cervical obstruction. 4. Nonvisualized ovaries   06/30/2019 Initial Biopsy   EMB: high grade adenocarcinoma   07/14/2019 Tumor Marker   CA-125: 354   07/21/2019 Imaging   CT A/P: IMPRESSION: 1. No gross extension of uterine carcinoma beyond the myometrium. 2. No pelvic lymphadenopathy. 3. No retroperitoneal periaortic adenopathy. 4. No visceral metastasis or skeletal metastasis. 5. Benign adenoma of the LEFT adrenal gland.  Benign renal cysts.   08/15/2019 Surgery   Robotic-assisted laparoscopic total hysterectomy with bilateral salpingoophorectomy, SLN injection, bilateral pelvic LND, mini-lap for specimen removal Modifier 22: significant obesity causing difficulty visualizing anatomy, increasing OR time    08/15/2019 Pathology Results   A. 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  - The surgical resection margins are negative for carcinoma.  - See oncology table below.   Myometrium: Leiomyomata.  Serosa: Unremarkable.  Bilateral adnexa: Benign ovaries and fallopian tubes with  endometriosis.   B. LYMPH NODES, RIGHT PELVIC, RESECTION:  - There is no evidence of carcinoma in 6 of 6 lymph nodes (0/6).   C. LYMPH NODES, LEFT PELVIC, RESECTION:  - There is no evidence of carcinoma in 7 of 7 lymph nodes (0/7).    ONCOLOGY TABLE:   UTERUS, CARCINOMA OR CARCINOSARCOMA   Procedure: Total hysterectomy and bilateral salpingo-oophorectomy  Histologic type: Clear cell adenocarcinoma  Histologic Grade: High-grade  Myometrial invasion:       Depth of invasion: 19.5 mm       Myometrial thickness: 20 mm  Uterine Serosa Involvement: Not definitively identified  Cervical stromal involvement: Present  Extent of involvement of other organs: Confined to lower uterine segment  Lymphovascular invasion: Not identified  Regional Lymph Nodes:       Examined:        0 Sentinel                               13 non-sentinel                               13 total        Lymph nodes with metastasis: 0  Representative Tumor Block: A17  MMR / MSI testing: Can be ordered upon clinician request.  Pathologic Stage Classification (pTNM, AJCC 8th edition):  pT2, pN0  (FIGO stage II)  Comments: Dr. Claudette Laws has reviewed selected slides and concurs with  the phenotype of this tumor.  Additional studies can be performed upon  clinician request.    08/15/2019 Cancer Staging   Staging form: Corpus Uteri - Carcinoma and Carcinosarcoma, AJCC 8th Edition - Clinical stage  from 08/15/2019: FIGO Stage II (cT2, cN0, cM0) - Signed by Lafonda Mosses, MD on 08/23/2019    09/18/2019 Imaging   Status post recent hysterectomy and bilateral salpingo-oophorectomy. Loculated fluid collections along the lateral pelvic sidewall may represent postoperative seroma or lymphocele. Superimposed infection or abscess are not excluded. Clinical correlation is recommended.   09/18/2019 Genetic Testing   PNegative genetic testing:  No pathogenic variants detected on the [facility] Common Hereditary Cancers Panel. The  report date is 09/18/2019.  The Common Hereditary Cancers Panel offered by [facility] includes sequencing and/or deletion duplication testing of the following 48 genes: APC, ATM, AXIN2, BARD1, BMPR1A, BRCA1, BRCA2, BRIP1, CDH1, CDK4, CDKN2A (p14ARF), CDKN2A (p16INK4a), CHEK2, CTNNA1, DICER1, EPCAM (Deletion/duplication testing only), GREM1 (promoter region deletion/duplication testing only), KIT, MEN1, MLH1, MSH2, MSH3, MSH6, MUTYH, NBN, NF1, NHTL1, PALB2, PDGFRA, PMS2, POLD1, POLE, PTEN, RAD50, RAD51C, RAD51D, RNF43, SDHB, SDHC, SDHD, SMAD4, SMARCA4. STK11, TP53, TSC1, TSC2, and VHL.  The following genes were evaluated for sequence changes only: SDHA and HOXB13 c.251G>A variant only.    09/20/2019 Procedure   Ultrasound and fluoroscopically guided right internal jugular single lumen power port catheter insertion. Tip in the SVC/RA junction. Catheter ready for use.   09/25/2019 - 10/23/2019 Chemotherapy   The patient had cisplatin for chemotherapy treatment.     09/26/2019 - 11/27/2019 Radiation Therapy   Site Technique Total Dose (Gy) Dose per Fx (Gy) Completed Fx Beam Energies  Vagina: Pelvis HDR-brachy 18/18 6 3/3 Ir-192  Uterus: Uterus IMRT 45/45 1.8 25/25 6X      12/18/2019 - 02/19/2020 Chemotherapy   The patient had carboplatin and taxol   03/19/2020 Imaging   1. Status post hysterectomy with bilateral salpingo oophorectomy since prior abdomen/pelvis CT of 07/21/2019. 2. Since pelvis CT 09/18/2019, interval decrease in size of the low-density lesions along the pelvic sidewalls bilaterally, compatible with resolving postoperative seromas. 3. 9 mm soft tissue nodule along the posterior right peritoneum, not definitely seen on prior imaging. Close attention on follow-up recommended. 4. Stable 12 mm left adrenal nodule, consistent with benign etiology.     03/14/2021 Imaging   No acute findings. No evidence of recurrent or metastatic carcinoma within the abdomen or pelvis.   Stable benign left  adrenal adenoma or nodular hyperplasia.     Interval History: Patient reports overall doing well.  She denies any vaginal bleeding or discharge.  Reports some intermittent constipation, has been using Metamucil.  Reports complete resolution of her urinary symptoms although sometimes has frequency at night.  Denies any abdominal or pelvic pain.  Past Medical/Surgical History: Past Medical History:  Diagnosis Date   Arthritis    Spinal - inflammatory   BMI 50.0-59.9, adult ([place])    Endometrial cancer ([place]) dx'd 06/2019   Family history of brain cancer    Family history of cervical cancer    Family history of colon cancer    GERD (gastroesophageal reflux disease)    History of radiation therapy 10/31/2019   IMRT uterus  09/26/2019-10/31/2019   Dr Gery Pray   History of radiation therapy 11/27/2019   vaginal brachytherapy  11/14/2019-11/27/2019   Dr Gery Pray   Hypertension    Morbid obesity [facility])    Uterine cancer [facility])     Past Surgical History:  Procedure Laterality Date   cardiac catherization  2011   IR IMAGING GUIDED PORT INSERTION  09/20/2019   IR REMOVAL TUN ACCESS W/ PORT W/O FL MOD SED  05/02/2021   LAPAROSCOPY  1996  scar tissue   ROBOTIC ASSISTED TOTAL HYSTERECTOMY WITH BILATERAL SALPINGO OOPHERECTOMY Bilateral 08/15/2019   Procedure: XI ROBOTIC ASSISTED TOTAL HYSTERECTOMY WITH BILATERAL SALPINGO OOPHORECTOMY;  Surgeon: Lafonda Mosses, MD;  Location: [facility];  Service: Gynecology;  Laterality: Bilateral;   SENTINEL NODE BIOPSY N/A 08/15/2019   Procedure: LYMPH NODE DISSECTION,LAPAROTOMY;  Surgeon: Lafonda Mosses, MD;  Location: [facility];  Service: Gynecology;  Laterality: N/A;   TONSILLECTOMY     WISDOM TOOTH EXTRACTION      Family History  Problem Relation Age of Onset   Heart disease Father    Thyroid disease Father    Hypertension Mother    COPD Mother    Hypertension Half-Brother    Thyroid disease Half-Sister    COPD Half-Sister    Hypertension  Half-Sister    Neuropathy Half-Sister    Cervical cancer Paternal Aunt        dx. in her late 56s   Brain cancer Paternal Uncle 48   Cervical cancer Paternal Grandmother        dx. in her 40s   Uterine cancer Neg Hx    Breast cancer Neg Hx     Social History   Socioeconomic History   Marital status: Single    Spouse name: Not on file   Number of children: Not on file   Years of education: Not on file   Highest education level: Not on file  Occupational History   Not on file  Tobacco Use   Smoking status: Never   Smokeless tobacco: Never  Vaping Use   Vaping Use: Never used  Substance and Sexual Activity   Alcohol use: Yes    Comment: rare glass of wine   Drug use: Never   Sexual activity: Not Currently  Other Topics Concern   Not on file  Social History Narrative   Not on file   Social Determinants of Health   Financial Resource Strain: Not on file  Food Insecurity: Not on file  Transportation Needs: Not on file  Physical Activity: Not on file  Stress: Not on file  Social Connections: Not on file    Current Medications:  Current Outpatient Medications:    acetaminophen (TYLENOL) 500 MG tablet, Take 500 mg by mouth every 6 (six) hours as needed., Disp: , Rfl:    amLODipine (NORVASC) 2.5 MG tablet, Take 2.5 mg by mouth daily., Disp: , Rfl:    hydrochlorothiazide (HYDRODIURIL) 25 MG tablet, Take 25 mg by mouth daily., Disp: , Rfl:    meloxicam (MOBIC) 15 MG tablet, Take 15 mg by mouth daily., Disp: , Rfl:    Multiple Vitamin (MULTIVITAMIN) tablet, Take 1 tablet by mouth daily., Disp: , Rfl:    rosuvastatin (CRESTOR) 20 MG tablet, Take 20 mg by mouth daily., Disp: , Rfl:   Review of Systems: Denies appetite changes, fevers, chills, fatigue, unexplained weight changes. Denies hearing loss, neck lumps or masses, mouth sores, ringing in ears or voice changes. Denies cough or wheezing.  Denies shortness of breath. Denies chest pain or palpitations. Denies leg  swelling. Denies abdominal distention, pain, blood in stools, constipation, diarrhea, nausea, vomiting, or early satiety. Denies pain with intercourse, dysuria, frequency, hematuria or incontinence. Denies hot flashes, pelvic pain, vaginal bleeding or vaginal discharge.   Denies joint pain, back pain or muscle pain/cramps. Denies itching, rash, or wounds. Denies dizziness, headaches, numbness or seizures. Denies swollen lymph nodes or glands, denies easy bruising or bleeding. Denies anxiety, depression, confusion, or decreased concentration.  Physical Exam: BP (!) 125/57 (BP Location: Left Arm, Patient Position: Sitting)   Pulse 80   Temp 98.4 F (36.9 C) (Oral)   Resp 16   Ht '5\' 1"'  (1.549 m)   Wt 281 lb (127.5 kg)   SpO2 100%   BMI 53.09 kg/m  General: Alert, oriented, no acute distress. HEENT: Normocephalic, atraumatic, sclera anicteric. Chest: Clear to auscultation bilaterally.  No wheezes or rhonchi. Cardiovascular: Regular rate and rhythm, no murmurs. Abdomen: Obese, soft, nontender.  Normoactive bowel sounds.  No masses or hepatosplenomegaly appreciated.  Well-healed mini lap and laparoscopic incisions. Extremities: Grossly normal range of motion.  Warm, well perfused.  Trace edema bilaterally. Skin: No rashes or lesions noted. Lymphatics: No cervical, supraclavicular, or inguinal adenopathy. GU: Normal appearing external genitalia without erythema, excoriation, or lesions.  Speculum exam reveals mildly atrophic vaginal mucosa.  Radiation changes noted at the apex, right more than left.  Bimanual exam reveals cuff is intact, no masses or nodularity.  Rectovaginal exam confirms these findings.  Laboratory & Radiologic Studies: None new  Assessment & Plan: Brittney Lamb is a 67 y.o. 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 MSS.    Patient is overall doing well and is NED on exam today.  Vaginal exam is stable.   Discussed keeping track of intake and exercise using an app.  The patient is somewhat frustrated by her 15 pound weight gain since diagnosis.  She has recently joined the [employer] and is walking on the treadmill and biking.   Per NCCN and SGO surveillance recommendations, we will plan to see the patient every 3 months for 2 years.  We discussed signs and symptoms that would be concerning for recurrence and the patient knows to call if she develops any of these.  She has scheduled to see Dr. Sondra Come in approximately 3 months and she will return to see me next April.  28 minutes of total time was spent for this patient encounter, including preparation, face-to-face counseling with the patient and coordination of care, and documentation of the encounter.  Jeral Pinch, MD  Division of Gynecologic Oncology  Department of Obstetrics and Gynecology  [facility]

## 2021-11-03 DIAGNOSIS — L509 Urticaria, unspecified: Secondary | ICD-10-CM | POA: Diagnosis not present

## 2021-11-03 DIAGNOSIS — R21 Rash and other nonspecific skin eruption: Secondary | ICD-10-CM | POA: Diagnosis not present

## 2021-11-03 DIAGNOSIS — R0981 Nasal congestion: Secondary | ICD-10-CM | POA: Diagnosis not present

## 2021-11-27 IMAGING — XA IR IMAGING GUIDED PORT INSERTION
1 series · 1 of 1 positions shown · non-contrast
Comparison: none

CLINICAL DATA: ENDOMETRIAL CARCINOMA, ACCESS FOR CHEMOTHERAPY

[Series 300: ir imaging guided port insertion · 1 of 1 slices shown]
[im 1/1]
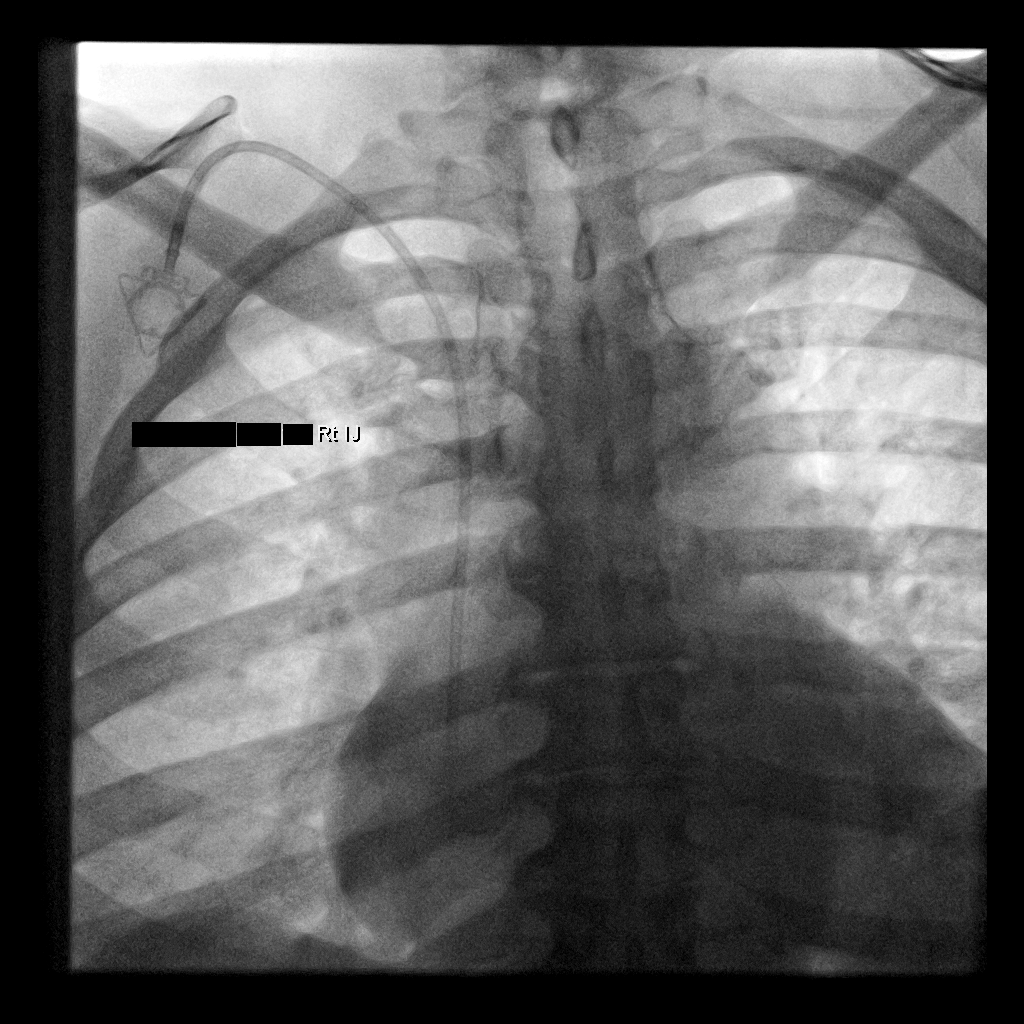

[1 of 1 positions shown; findings below may reference images not displayed]

EXAM:
RIGHT INTERNAL JUGULAR SINGLE LUMEN POWER PORT CATHETER INSERTION

Date:  09/20/2019 09/20/2019 [DATE]

Radiologist:  Fabiol, Enkel

Guidance:  Ultrasound fluoroscopic

MEDICATIONS:
Ancef 2 g; The antibiotic was administered within an appropriate
time interval prior to skin puncture.

ANESTHESIA/SEDATION:
Versed 2.0 mg IV; Fentanyl 100 mcg IV;

Moderate Sedation Time:  24 minutes

The patient was continuously monitored during the procedure by the
interventional radiology nurse under my direct supervision.

FLUOROSCOPY TIME:  0 minutes, 36 seconds (15 mGy)

COMPLICATIONS:
None immediate.

CONTRAST:  None.

PROCEDURE:
Informed consent was obtained from the patient following explanation
of the procedure, risks, benefits and alternatives. The patient
understands, agrees and consents for the procedure. All questions
were addressed. A time out was performed.

Maximal barrier sterile technique utilized including caps, mask,
sterile gowns, sterile gloves, large sterile drape, hand hygiene,
and 2% chlorhexidine scrub.

Under sterile conditions and local anesthesia, right internal
jugular micropuncture venous access was performed. Access was
performed with ultrasound. Images were obtained for documentation of
the patent right internal jugular vein. A guide wire was inserted
followed by a transitional dilator. This allowed insertion of a
guide wire and catheter into the IVC. Measurements were obtained
from the SVC / RA junction back to the right IJ venotomy site. In
the right infraclavicular chest, a subcutaneous pocket was created
over the second anterior rib. This was done under sterile conditions
and local anesthesia. 1% lidocaine with epinephrine was utilized for
this. A 2.5 cm incision was made in the skin. Blunt dissection was
performed to create a subcutaneous pocket over the right pectoralis
major muscle. The pocket was flushed with saline vigorously. There
was adequate hemostasis. The port catheter was assembled and checked
for leakage. The port catheter was secured in the pocket with two
retention sutures. The tubing was tunneled subcutaneously to the
right venotomy site and inserted into the SVC/RA junction through a
valved peel-away sheath. Position was confirmed with fluoroscopy.
Images were obtained for documentation. The patient tolerated the
procedure well. No immediate complications. Incisions were closed in
a two layer fashion with 4 - 0 Vicryl suture. Dermabond was applied
to the skin. The port catheter was accessed, blood was aspirated
followed by saline and heparin flushes. Needle was removed. A dry
sterile dressing was applied.
IMPRESSION: Ultrasound and fluoroscopically guided right internal jugular single
lumen power port catheter insertion. Tip in the SVC/RA junction.
Catheter ready for use.

## 2022-01-13 ENCOUNTER — Telehealth: Payer: Self-pay | Admitting: *Deleted

## 2022-01-13 NOTE — Telephone Encounter (Signed)
CALLED PATIENT TO ALTER FU ON 01-22-22 DUE TO DR. KINARD BEING ON VACATION, RESCHEDULED FOR 02-05-22 @ 8:30 AM, LVM FOR A RETURN CALL

## 2022-01-22 ENCOUNTER — Ambulatory Visit: Payer: Self-pay | Admitting: Radiation Oncology

## 2022-02-04 NOTE — Progress Notes (Signed)
Radiation Oncology         [phone] ________________________________  Name: Brittney Lamb MRN: [id number]  Date: 02/05/2022  DOB: April 19, 1956  Follow-Up Visit Note  CC: [name], MD  [name], MD  No diagnosis found.  Diagnosis: FIGO stage II (cT2, cN0, cM0) high-grade invasive clear cell adenocarcinoma of the endometrium  Interval Since Last Radiation: 2 years, 2 months, and 9 days   Radiation Treatment Dates: 09/26/2019 through 11/27/2019 Site Technique Total Dose (Gy) Dose per Fx (Gy) Completed Fx Beam Energies  Vagina: Pelvis HDR-brachy 18/18 6 3/3 Ir-192  Uterus: Pelvis IMRT 45/45 1.8 25/25 6X    Narrative:  The patient returns today for routine 6 month follow-up, she was last seen for follow up on 07/17/21. Since her last visit, the patient followed up with Dr. Berline Lopes on 10/16/21. During which time, the patient reported complete resolution of her urinary symptoms other that occasional urinary frequency at night. She also reported some intermittent constipation but denied any symptoms concerning for disease recurrence and was noted as NED on examination.        Otherwise, no significant interval history since the patient was last seen.   ***                          Allergies:  is allergic to lisinopril and saxenda [liraglutide -weight management].  Meds: Current Outpatient Medications  Medication Sig Dispense Refill   acetaminophen (TYLENOL) 500 MG tablet Take 500 mg by mouth every 6 (six) hours as needed.     amLODipine (NORVASC) 2.5 MG tablet Take 2.5 mg by mouth daily.     hydrochlorothiazide (HYDRODIURIL) 25 MG tablet Take 25 mg by mouth daily.     meloxicam (MOBIC) 15 MG tablet Take 15 mg by mouth daily.     Multiple Vitamin (MULTIVITAMIN) tablet Take 1 tablet by mouth daily.     rosuvastatin (CRESTOR) 20 MG tablet Take 20 mg by mouth daily.     No current facility-administered medications for this encounter.    Physical Findings: The patient is  in no acute distress. Patient is alert and oriented.  vitals were not taken for this visit. .  No significant changes. Lungs are clear to auscultation bilaterally. Heart has regular rate and rhythm. No palpable cervical, supraclavicular, or axillary adenopathy. Abdomen soft, non-tender, normal bowel sounds.  On pelvic examination the external genitalia were unremarkable. A speculum exam was performed. There are no mucosal lesions noted in the vaginal vault. A Pap smear was obtained of the proximal vagina. On bimanual and rectovaginal examination there were no pelvic masses appreciated. ***   Lab Findings: Lab Results  Component Value Date   WBC 6.3 03/05/2021   HGB 11.9 (L) 03/05/2021   HCT 36.3 03/05/2021   MCV 87.1 03/05/2021   PLT 244 03/05/2021    Radiographic Findings: No results found.  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  ***  Plan:  ***   *** minutes of total time was spent for this patient encounter, including preparation, face-to-face counseling with the patient and coordination of care, physical exam, and documentation of the encounter. ____________________________________  Blair Promise, PhD, MD  This document serves as a record of services personally performed by Gery Pray, MD. It was created on his behalf by Roney Mans, a trained medical scribe. The creation of  this record is based on the scribe's personal observations and the provider's statements to them. This document has been checked and approved by the attending provider.

## 2022-02-05 ENCOUNTER — Other Ambulatory Visit: Payer: Self-pay

## 2022-02-05 ENCOUNTER — Encounter: Payer: Self-pay | Admitting: Radiation Oncology

## 2022-02-05 ENCOUNTER — Ambulatory Visit
Admission: RE | Admit: 2022-02-05 | Discharge: 2022-02-05 | Disposition: A | Discharge status: Home / Self Care | Payer: Medicare HMO | Source: Ambulatory Visit | Attending: Radiation Oncology | Admitting: Radiation Oncology

## 2022-02-05 DIAGNOSIS — C541 Malignant neoplasm of endometrium: Secondary | ICD-10-CM | POA: Diagnosis not present

## 2022-02-05 DIAGNOSIS — Z79899 Other long term (current) drug therapy: Secondary | ICD-10-CM | POA: Diagnosis not present

## 2022-02-05 DIAGNOSIS — Z923 Personal history of irradiation: Secondary | ICD-10-CM | POA: Diagnosis not present

## 2022-02-05 DIAGNOSIS — K5900 Constipation, unspecified: Secondary | ICD-10-CM | POA: Insufficient documentation

## 2022-02-05 DIAGNOSIS — Z791 Long term (current) use of non-steroidal anti-inflammatories (NSAID): Secondary | ICD-10-CM | POA: Diagnosis not present

## 2022-02-05 DIAGNOSIS — Z8542 Personal history of malignant neoplasm of other parts of uterus: Secondary | ICD-10-CM | POA: Diagnosis not present

## 2022-02-05 NOTE — Progress Notes (Signed)
Brittney Lamb is here today for follow up post radiation to the pelvic.  They completed their radiation on: 12/06/19   Does the patient complain of any of the following:  Pain: No Abdominal bloating: No Diarrhea/Constipation: No Nausea/Vomiting: No Vaginal Discharge: No Blood in Urine or Stool: No Urinary Issues (dysuria/incomplete emptying/ incontinence/ increased frequency/urgency): No Does patient report using vaginal dilator 2-3 times a week and/or sexually active 2-3 weeks: Yes Post radiation skin changes: No   Additional comments if applicable:   BP (!) 300/51 (BP Location: Left Arm, Patient Position: Sitting, Cuff Size: Large)   Pulse 76   Temp (!) 97.5 F (36.4 C)   Resp 20   Ht '5\' 1"'$  (1.549 m)   Wt 274 lb (124.3 kg)   SpO2 100%   BMI 51.77 kg/m

## 2022-02-09 DIAGNOSIS — Z6841 Body Mass Index (BMI) 40.0 and over, adult: Secondary | ICD-10-CM | POA: Diagnosis not present

## 2022-02-09 DIAGNOSIS — I10 Essential (primary) hypertension: Secondary | ICD-10-CM | POA: Diagnosis not present

## 2022-02-09 DIAGNOSIS — Z0000 Encounter for general adult medical examination without abnormal findings: Secondary | ICD-10-CM | POA: Diagnosis not present

## 2022-02-09 DIAGNOSIS — E7800 Pure hypercholesterolemia, unspecified: Secondary | ICD-10-CM | POA: Diagnosis not present

## 2022-02-09 DIAGNOSIS — L501 Idiopathic urticaria: Secondary | ICD-10-CM | POA: Diagnosis not present

## 2022-03-24 ENCOUNTER — Ambulatory Visit (INDEPENDENT_AMBULATORY_CARE_PROVIDER_SITE_OTHER): Payer: Medicare HMO | Admitting: Internal Medicine

## 2022-03-24 ENCOUNTER — Encounter: Payer: Self-pay | Admitting: Internal Medicine

## 2022-03-24 VITALS — BP 112/70 | HR 85 | Temp 98.3°F | Resp 16 | Ht 61.42 in | Wt 273.4 lb

## 2022-03-24 DIAGNOSIS — L501 Idiopathic urticaria: Secondary | ICD-10-CM

## 2022-03-24 DIAGNOSIS — J310 Chronic rhinitis: Secondary | ICD-10-CM

## 2022-03-24 MED ORDER — FLUTICASONE PROPIONATE 50 MCG/ACT NA SUSP: 2.0000 | Freq: Every day | NASAL | 3 refills | Status: DC

## 2022-03-24 MED ORDER — FEXOFENADINE HCL 180 MG PO TABS: 360.0000 mg | ORAL_TABLET | Freq: Two times a day (BID) | ORAL | 3 refills | Status: DC

## 2022-03-24 MED ORDER — FAMOTIDINE 20 MG PO TABS: 20.0000 mg | ORAL_TABLET | Freq: Two times a day (BID) | ORAL | 3 refills | Status: DC

## 2022-03-24 MED ORDER — AZELASTINE HCL 0.1 % NA SOLN: 1.0000 | Freq: Two times a day (BID) | NASAL | 3 refills | Status: DC | PRN

## 2022-03-24 NOTE — Progress Notes (Signed)
NEW PATIENT  Date of Service/Encounter:  03/24/22  Consult requested by: [name], MD   Subjective:   Brittney Lamb (DOB: [birth year]) is a 66 y.o. female who presents to the clinic on 03/24/2022 with a chief complaint of Establish Care and Urticaria (Since the last of June. ) .    History obtained from: chart review and patient.   Rhinitis:  Started around age 35s. Symptoms include: nasal congestion, rhinorrhea, post nasal drainage, watery eyes, and itchy eyes  Occurs year-round Potential triggers: none Treatments tried: none Previous allergy testing: yes, around 2007-2008 but can't recall results.   History of chronic sinusitis or sinus surgery: no  Chronic Urticaria/Angioedema: Symptoms:  Started at the end of June 2023.  No pictures.  No illness at the time or no new medications.  At the time, she did have a major stressor in her life as her sister was sick and passed away.  Rash is itchy, raised, redness.  It happens anywhere and worse in pressure areas.  No pain or scarring.   She went to the urgent care the next morning, she got a steroid shot and was given oral prednisone but it made her hives worse so she stopped them. Then she went to the PCP and they referred her to an Allergist. She is on Allegra '180mg'$  as needed.  Last use was 5 days ago.  Currently, the hives are happening almost daily.  Angioedema Episodes: has some swelling of chin    Past Medical History: Past Medical History:  Diagnosis Date   Arthritis    Spinal - inflammatory   BMI 50.0-59.9, adult ([place])    Endometrial cancer ([place]) dx'd 06/2019   Family history of brain cancer    Family history of cervical cancer    Family history of colon cancer    GERD (gastroesophageal reflux disease)    History of radiation therapy 10/31/2019   IMRT uterus  09/26/2019-10/31/2019   Dr Gery Pray   History of radiation therapy 11/27/2019   vaginal brachytherapy  11/14/2019-11/27/2019   Dr Gery Pray    Hypertension    Morbid obesity ([place])    Urticaria    Uterine cancer [facility])    Past Surgical History: Past Surgical History:  Procedure Laterality Date   cardiac catherization  2011   IR IMAGING GUIDED PORT INSERTION  09/20/2019   IR REMOVAL TUN ACCESS W/ PORT W/O FL MOD SED  05/02/2021   LAPAROSCOPY  1996   scar tissue   ROBOTIC ASSISTED TOTAL HYSTERECTOMY WITH BILATERAL SALPINGO OOPHERECTOMY Bilateral 08/15/2019   Procedure: XI ROBOTIC ASSISTED TOTAL HYSTERECTOMY WITH BILATERAL SALPINGO OOPHORECTOMY;  Surgeon: Lafonda Mosses, MD;  Location: [facility];  Service: Gynecology;  Laterality: Bilateral;   SENTINEL NODE BIOPSY N/A 08/15/2019   Procedure: LYMPH NODE DISSECTION,LAPAROTOMY;  Surgeon: Lafonda Mosses, MD;  Location: [facility];  Service: Gynecology;  Laterality: N/A;   TONSILLECTOMY     WISDOM TOOTH EXTRACTION      Family History: Family History  Problem Relation Age of Onset   Hypertension Mother    COPD Mother    Heart disease Father    Thyroid disease Father    Cervical cancer Paternal Aunt        dx. in her late 9s   Brain cancer Paternal Uncle 90   Cervical cancer Paternal Grandmother        dx. in her 6s   Hypertension Half-Brother    Thyroid disease Half-Sister    COPD Half-Sister  Hypertension Half-Sister    Neuropathy Half-Sister    Eczema Niece    Eczema Grandson    Uterine cancer Neg Hx    Breast cancer Neg Hx     Social History:  Lives in a 22 year house Flooring in bedroom: laminate Pets: dog Tobacco use/exposure: none Job: none  Medication List:  Allergies as of 03/24/2022       Reactions   Lisinopril Swelling   Saxenda [liraglutide -weight Management] Itching, Rash        Medication List        Accurate as of March 24, 2022 11:06 AM. If you have any questions, ask your nurse or doctor.          acetaminophen 500 MG tablet Commonly known as: TYLENOL Take 500 mg by mouth every 6 (six) hours as needed.   amLODipine 2.5 MG  tablet Commonly known as: NORVASC Take 2.5 mg by mouth daily.   hydrochlorothiazide 25 MG tablet Commonly known as: HYDRODIURIL Take 25 mg by mouth daily.   meloxicam 15 MG tablet Commonly known as: MOBIC Take 15 mg by mouth daily.   multivitamin tablet Take 1 tablet by mouth daily.   rosuvastatin 20 MG tablet Commonly known as: CRESTOR Take 20 mg by mouth daily.         REVIEW OF SYSTEMS: Pertinent positives and negatives discussed in HPI.   Objective:   Physical Exam: BP 112/70   Pulse 85   Temp 98.3 F (36.8 C) (Temporal)   Resp 16   Ht 5' 1.42" (1.56 m)   Wt 273 lb 6.4 oz (124 kg)   SpO2 97%   BMI 50.96 kg/m  Body mass index is 50.96 kg/m. GEN: alert, well developed HEENT: clear conjunctiva, TM grey and translucent, nose with + inferior turbinate hypertrophy, pale nasal mucosa, slight clear rhinorrhea, + cobblestoning HEART: regular rate and rhythm, no murmur LUNGS: clear to auscultation bilaterally, no coughing, unlabored respiration ABDOMEN: soft, non distended  SKIN: no rashes or lesions  Reviewed:  11/03/2021: seen PCP for nasal congestion and urticaria/rash. On Allegra and referred to Allergy.   Skin Testing:  Skin prick testing was placed, which includes aeroallergens/foods, histamine control, and saline control.  Verbal consent was obtained prior to placing test.  We discussed risks including anaphylaxis. Patient tolerated procedure well.  Allergy testing results were read and interpreted by myself, documented by clinical staff. Adequate positive and negative control.  Results discussed with patient/family.  Airborne Adult Perc - 03/24/22 1030     Time Antigen Placed 1030    Allergen Manufacturer Greer    Location Back    Number of Test 59    Panel 1 Select               Assessment:   1. Chronic rhinitis   2. Idiopathic urticaria     Plan/Recommendations:  Idiopathic Urticaria (Hives): - Start Allegra '180mg'$  daily.   - If no  improvement in 2-3 days, increase to Allegra '180mg'$  twice daily.   - If no improvement in 2-3 days, add Pepcid '20mg'$  twice daily and continue Allegra '180mg'$  twice daily. - If still no improvement, increase to Allegra '360mg'$  twice daily and Pepcid '40mg'$  twice daily.  Dry skin: - Use a gentle, unscented cleanser at the end of the bath (such as Dove unscented bar or baby wash, or Aveeno sensitive body wash). Then rinse, pat half-way dry, and apply a gentle, unscented moisturizer cream or ointment all over while still damp.  Dry skin makes the itching worse.. The skin should be moisturized with a gentle, unscented moisturizer at least twice daily.  - Use only unscented liquid laundry detergent.  Chronic Rhinitis: - Positive skin test to none 03/2022. - Use nasal saline rinses before nose sprays such as with Neilmed Sinus Rinse.  Use distilled water.   - Use Flonase 2 sprays each nostril daily. Aim upward and outward. - Use Azelastine 1-2 sprays each nostril twice daily as needed. Aim upward and outward.  Return in about 4 weeks (around 04/21/2022).          Return in about 4 weeks (around 04/21/2022).  Harlon Flor, MD Allergy and [place] [facility] [place]

## 2022-03-24 NOTE — Patient Instructions (Addendum)
Idiopathic Urticaria (Hives): - Start Allegra '180mg'$  daily.   - If no improvement in 2-3 days, increase to Allegra '180mg'$  twice daily.   - If no improvement in 2-3 days, add Pepcid '20mg'$  twice daily and continue Allegra '180mg'$  twice daily. - If still no improvement, increase to Allegra '360mg'$  twice daily and Pepcid '40mg'$  twice daily.  Dry skin: - Use a gentle, unscented cleanser at the end of the bath (such as Dove unscented bar or baby wash, or Aveeno sensitive body wash). Then rinse, pat half-way dry, and apply a gentle, unscented moisturizer cream or ointment all over while still damp. Dry skin makes the itching worse.. The skin should be moisturized with a gentle, unscented moisturizer at least twice daily.  - Use only unscented liquid laundry detergent.  Rhinitis: - Positive skin test to none 03/2022. - Use nasal saline rinses before nose sprays such as with Neilmed Sinus Rinse.  Use distilled water.   - Use Flonase 2 sprays each nostril daily. Aim upward and outward. - Use Azelastine 1-2 sprays each nostril twice daily as needed. Aim upward and outward.  Return in about 4 weeks (around 04/21/2022).

## 2022-04-21 ENCOUNTER — Other Ambulatory Visit: Payer: Self-pay

## 2022-04-21 ENCOUNTER — Ambulatory Visit: Payer: Medicare HMO | Admitting: Internal Medicine

## 2022-04-21 ENCOUNTER — Encounter: Payer: Self-pay | Admitting: Internal Medicine

## 2022-04-21 VITALS — BP 118/72 | HR 80 | Temp 98.2°F | Resp 16 | Ht 64.0 in | Wt 273.6 lb

## 2022-04-21 DIAGNOSIS — L501 Idiopathic urticaria: Secondary | ICD-10-CM

## 2022-04-21 DIAGNOSIS — J3089 Other allergic rhinitis: Secondary | ICD-10-CM | POA: Diagnosis not present

## 2022-04-21 MED ORDER — AZELASTINE HCL 0.1 % NA SOLN: 1.0000 | Freq: Two times a day (BID) | NASAL | 5 refills | Status: AC | PRN

## 2022-04-21 MED ORDER — FEXOFENADINE HCL 180 MG PO TABS: 360.0000 mg | ORAL_TABLET | Freq: Two times a day (BID) | ORAL | 5 refills | Status: DC

## 2022-04-21 MED ORDER — FAMOTIDINE 20 MG PO TABS: 20.0000 mg | ORAL_TABLET | Freq: Two times a day (BID) | ORAL | 5 refills | Status: AC

## 2022-04-21 MED ORDER — FLUTICASONE PROPIONATE 50 MCG/ACT NA SUSP: 2.0000 | Freq: Every day | NASAL | 5 refills | Status: AC

## 2022-04-21 NOTE — Patient Instructions (Addendum)
Idiopathic Urticaria (Hives): - Stop the Pepcid and see how you do. If you don't have any hives, just continue Allegra '180mg'$  twice daily.  If you still don't have hives for about 1 month, decrease to Allegra '180mg'$  daily.   - If the hives come back, then go back to Allegra '180mg'$  twice daily and Pepcid '40mg'$  twice daily.  - If still no improvement, increase to Allegra '360mg'$  twice daily and Pepcid '40mg'$  twice daily.  Chronic Rhinitis: - Positive skin test to none 03/2022. - Use nasal saline rinses before nose sprays such as with Neilmed Sinus Rinse.  Use distilled water.   - Use Flonase 2 sprays each nostril daily. Aim upward and outward. - Use Azelastine 1-2 sprays each nostril twice daily as needed. Aim upward and outward. - Use Allegra '180mg'$  as needed.   Return in about 4 months (around 08/21/2022).

## 2022-04-21 NOTE — Progress Notes (Signed)
   FOLLOW UP Date of Service/Encounter:  04/21/22   Subjective:  Brittney Lamb (DOB: 22-Jun-1955) is a 66 y.o. female who returns to the Allergy and [place] on 04/21/2022 for follow up for chronic rhinitis and idiopathic urticaria.   History obtained from: chart review and patient.  Last visit was 03/24/2022 with me. SPT was negative.  Started on Allegra/Pepcid for hives and Flonase/Azelastine for chronic rhinitis.   Since last visit, she has had few episodes of hives lasting about 3 days around the holidays with stress. Her sister passed away 1 year ago around this time.  She takes Allegra '180mg'$  and Pepcid '20mg'$  1 tablet twice daily.     She has not had much congestion, runny nose or ocular symptoms since starting the Allegra.  She uses the Flonase PRN about once a week because her symptoms have been controlled on Allegra. She has not needed the Azelastine.    Past Medical History: Past Medical History:  Diagnosis Date   Arthritis    Spinal - inflammatory   BMI 50.0-59.9, adult ([place])    Endometrial cancer ([place]) dx'd 06/2019   Family history of brain cancer    Family history of cervical cancer    Family history of colon cancer    GERD (gastroesophageal reflux disease)    History of radiation therapy 10/31/2019   IMRT uterus  09/26/2019-10/31/2019   Dr Gery Pray   History of radiation therapy 11/27/2019   vaginal brachytherapy  11/14/2019-11/27/2019   Dr Gery Pray   Hypertension    Morbid obesity ([place])    Urticaria    Uterine cancer ([place])     Objective:  BP 118/72   Pulse 80   Temp 98.2 F (36.8 C) (Temporal)   Resp 16   Ht '5\' 4"'$  (1.626 m)   Wt 273 lb 9.6 oz (124.1 kg)   SpO2 96%   BMI 46.96 kg/m  Body mass index is 46.96 kg/m. Physical Exam: GEN: alert, well developed HEENT: clear conjunctiva, TM grey and translucent, nose with moderate inferior turbinate hypertrophy, pink nasal mucosa, no rhinorrhea, no cobblestoning HEART: regular rate and rhythm, no  murmur LUNGS: clear to auscultation bilaterally, no coughing, unlabored respiration SKIN: no rashes or lesions  Assessment/Plan  Idiopathic Urticaria (Hives): - Improved with anti-histamines.   - Stop the Pepcid and see how you do. If you don't have any hives, just continue Allegra '180mg'$  twice daily.  If you still don't have hives for about 1 month, decrease to Allegra '180mg'$  daily.   - If the hives come back, then go back to Allegra '180mg'$  twice daily and Pepcid '40mg'$  twice daily.  - If still no improvement, increase to Allegra '360mg'$  twice daily and Pepcid '40mg'$  twice daily.  Chronic Rhinitis: - Positive skin test to none 03/2022. - Use nasal saline rinses before nose sprays such as with Neilmed Sinus Rinse.  Use distilled water.   - Use Flonase 2 sprays each nostril daily. Aim upward and outward. - Use Azelastine 1-2 sprays each nostril twice daily as needed. Aim upward and outward. - Use Allegra '180mg'$  as needed.   Return in about 4 months (around 08/21/2022). Harlon Flor, MD  Allergy and [place] [facility] [place]

## 2022-05-09 DIAGNOSIS — M5431 Sciatica, right side: Secondary | ICD-10-CM | POA: Diagnosis not present

## 2022-05-09 DIAGNOSIS — M25551 Pain in right hip: Secondary | ICD-10-CM | POA: Diagnosis not present

## 2022-06-11 DIAGNOSIS — E7800 Pure hypercholesterolemia, unspecified: Secondary | ICD-10-CM | POA: Diagnosis not present

## 2022-06-11 DIAGNOSIS — J069 Acute upper respiratory infection, unspecified: Secondary | ICD-10-CM | POA: Diagnosis not present

## 2022-06-11 DIAGNOSIS — I10 Essential (primary) hypertension: Secondary | ICD-10-CM | POA: Diagnosis not present

## 2022-06-11 DIAGNOSIS — Z6841 Body Mass Index (BMI) 40.0 and over, adult: Secondary | ICD-10-CM | POA: Diagnosis not present

## 2022-06-11 DIAGNOSIS — Z2821 Immunization not carried out because of patient refusal: Secondary | ICD-10-CM | POA: Diagnosis not present

## 2022-06-11 DIAGNOSIS — R7301 Impaired fasting glucose: Secondary | ICD-10-CM | POA: Diagnosis not present

## 2022-06-18 DIAGNOSIS — Z1231 Encounter for screening mammogram for malignant neoplasm of breast: Secondary | ICD-10-CM | POA: Diagnosis not present

## 2022-08-21 ENCOUNTER — Ambulatory Visit: Payer: Medicare HMO | Admitting: Internal Medicine

## 2022-08-24 ENCOUNTER — Encounter: Payer: Self-pay | Admitting: Allergy and Immunology

## 2022-08-24 ENCOUNTER — Ambulatory Visit: Payer: Medicare HMO | Admitting: Allergy and Immunology

## 2022-08-24 VITALS — BP 128/76 | HR 80 | Resp 18

## 2022-08-24 DIAGNOSIS — L501 Idiopathic urticaria: Secondary | ICD-10-CM | POA: Diagnosis not present

## 2022-08-24 DIAGNOSIS — J3089 Other allergic rhinitis: Secondary | ICD-10-CM

## 2022-08-24 MED ORDER — FEXOFENADINE HCL 180 MG PO TABS: ORAL_TABLET | ORAL | 3 refills | Status: AC

## 2022-08-24 NOTE — Patient Instructions (Addendum)
  1. Can continue the following to prevent hives:   A. Allegra 180 - 1 tablet 1-2 times per day  B. Famotidine 20 - 1 tablet 1-2 times per day  2. Can continue the following for nasal issues:   A. Fluticasone - 1 spray each nostril 1-2 times per day  B. Azelastine - 1 spray each nostril 1-2 times per day  3. Return to clinic in 1 year or earlier if problem

## 2022-08-24 NOTE — Progress Notes (Unsigned)
[place] - [place] - [place] - [place] - [place]   Follow-up Note  Referring Provider: Alinda Deem, MD Primary Provider: Alinda Deem, MD Date of Office Visit: 08/24/2022  Subjective:   Brittney Lamb (DOB: 05/07/1956) is a 67 y.o. female who [facility]22 in re-evaluation of the following:  HPI: Brittney returns to this clinic in evaluation of chronic urticaria and rhinitis.  I have never seen her in this clinic and her last visit with Dr. Allena Katz was 21 April 2022.  As long as she continues on Allegra and Pepcid she does not have any urticaria.  If she misses these medications she develops urticaria.  She intermittently and rarely uses any nasal azelastine and nasal steroid and overall her nose has been doing very well and she has not required a systemic steroid or an antibiotic for any type of airway issue.  Allergies as of 08/24/2022       Reactions   Lisinopril Swelling   Saxenda [liraglutide -weight Management] Itching, Rash        Medication List    acetaminophen 500 MG tablet Commonly known as: TYLENOL Take 500 mg by mouth every 6 (six) hours as needed.   amLODipine 2.5 MG tablet Commonly known as: NORVASC Take 2.5 mg by mouth daily.   azelastine 0.1 % nasal spray Commonly known as: ASTELIN Place 1 spray into both nostrils 2 (two) times daily as needed for rhinitis. Use in each nostril as directed   famotidine 20 MG tablet Commonly known as: Pepcid Take 1 tablet (20 mg total) by mouth 2 (two) times daily.   fexofenadine 180 MG tablet Commonly known as: Allegra Allergy Take 2 tablets (360 mg total) by mouth in the morning and at bedtime.   fluticasone 50 MCG/ACT nasal spray Commonly known as: FLONASE Place 2 sprays into both nostrils daily.   hydrochlorothiazide 25 MG tablet Commonly known as: HYDRODIURIL Take 25 mg by mouth daily.   meloxicam 15 MG tablet Commonly known as: MOBIC Take 15 mg  by mouth daily.   multivitamin tablet Take 1 tablet by mouth daily.   rosuvastatin 20 MG tablet Commonly known as: CRESTOR Take 20 mg by mouth daily.    Past Medical History:  Diagnosis Date   Arthritis    Spinal - inflammatory   BMI 50.0-59.9, adult ([facility])    Endometrial cancer ([facility]) dx'd 06/2019   Family history of brain cancer    Family history of cervical cancer    Family history of colon cancer    GERD (gastroesophageal reflux disease)    History of radiation therapy 10/31/2019   IMRT uterus  09/26/2019-10/31/2019   Dr Antony Blackbird   History of radiation therapy 11/27/2019   vaginal brachytherapy  11/14/2019-11/27/2019   Dr Antony Blackbird   Hypertension    Morbid obesity ([facility])    Urticaria    Uterine cancer [facility])     Past Surgical History:  Procedure Laterality Date   cardiac catherization  2011   IR IMAGING GUIDED PORT INSERTION  09/20/2019   IR REMOVAL TUN ACCESS W/ PORT W/O FL MOD SED  05/02/2021   LAPAROSCOPY  1996   scar tissue   ROBOTIC ASSISTED TOTAL HYSTERECTOMY WITH BILATERAL SALPINGO OOPHERECTOMY Bilateral 08/15/2019   Procedure: XI ROBOTIC ASSISTED TOTAL HYSTERECTOMY WITH BILATERAL SALPINGO OOPHORECTOMY;  Surgeon: Carver Fila, MD;  [facility];  Service: Gynecology;  Laterality: Bilateral;   SENTINEL NODE BIOPSY N/A 08/15/2019   Procedure:  LYMPH NODE DISSECTION,LAPAROTOMY;  Surgeon: Carver Fila, MD;  Location: [facility];  Service: Gynecology;  Laterality: N/A;   TONSILLECTOMY     WISDOM TOOTH EXTRACTION      Review of systems negative except as noted in HPI / PMHx or noted below:  Review of Systems  Constitutional: Negative.   HENT: Negative.    Eyes: Negative.   Respiratory: Negative.    Cardiovascular: Negative.   Gastrointestinal: Negative.   Genitourinary: Negative.   Musculoskeletal: Negative.   Skin: Negative.   Neurological: Negative.   Endo/Heme/Allergies: Negative.   Psychiatric/Behavioral: Negative.       Objective:    There were no vitals filed for this visit.        Physical Exam Constitutional:      Appearance: She is not diaphoretic.  HENT:     Head: Normocephalic.     Right Ear: Tympanic membrane, ear canal and external ear normal.     Left Ear: Tympanic membrane, ear canal and external ear normal.     Nose: Nose normal. No mucosal edema or rhinorrhea.     Mouth/Throat:     Pharynx: Uvula midline. No oropharyngeal exudate.  Eyes:     Conjunctiva/sclera: Conjunctivae normal.  Neck:     Thyroid: No thyromegaly.     Trachea: Trachea normal. No tracheal tenderness or tracheal deviation.  Cardiovascular:     Rate and Rhythm: Normal rate and regular rhythm.     Heart sounds: Normal heart sounds, S1 normal and S2 normal. No murmur heard. Pulmonary:     Effort: No respiratory distress.     Breath sounds: Normal breath sounds. No stridor. No wheezing or rales.  Lymphadenopathy:     Head:     Right side of head: No tonsillar adenopathy.     Left side of head: No tonsillar adenopathy.     Cervical: No cervical adenopathy.  Skin:    Findings: No erythema or rash.     Nails: There is no clubbing.  Neurological:     Mental Status: She is alert.     Diagnostics: none  Assessment and Plan:   1. Idiopathic urticaria   2. Other allergic rhinitis    1. Can continue the following to prevent hives:   A. Allegra 180 - 1 tablet 1-2 times per day  B. Famotidine 20 - 1 tablet 1-2 times per day  2. Can continue the following for nasal issues:   A. Fluticasone - 1 spray each nostril 1-2 times per day  B. Azelastine - 1 spray each nostril 1-2 times per day  3. Return to clinic in 1 year or earlier if problem  Brittney appears to be doing very well and she has a good understanding of her disease state and how her medications work and understands how to vary the dose depending on disease activity.  Assuming she does well with this plan I will see her back in this clinic in 1 year or earlier if  there is a problem.  Laurette Schimke, MD Allergy / Immunology [facility]

## 2022-08-25 ENCOUNTER — Encounter: Payer: Self-pay | Admitting: Allergy and Immunology

## 2022-12-10 IMAGING — CT CT ABD-PELV W/O CM
2 of 4 series · 16 of 46 positions shown, 18 images · non-contrast
Comparison: 03/19/2020

CLINICAL DATA: Endometrial carcinoma status post hysterectomy and
chemotherapy now with lower abdominal pain.

EXAM:
CT ABDOMEN AND PELVIS WITHOUT CONTRAST
TECHNIQUE: Multidetector CT imaging of the abdomen and pelvis was performed
following the standard protocol without IV contrast.

[Series 2: axial st · axial · 0.98mm/px · z∈[-448,-53]mm · 13 of 91 slices shown, 15 images]
[im 6/91  soft-tissue]
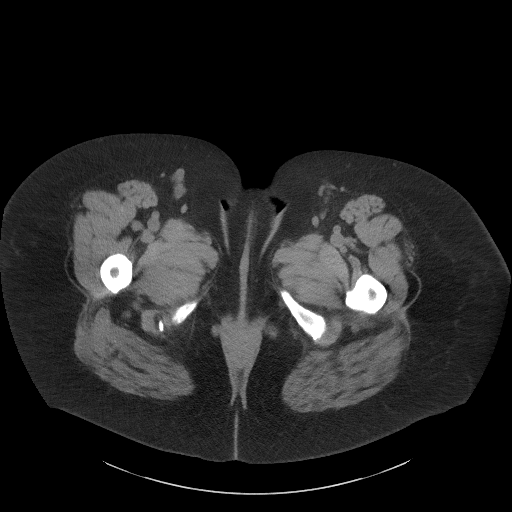
[im 6/91  bone]
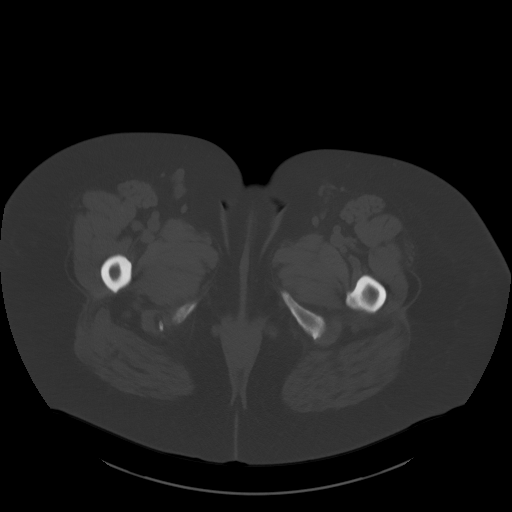
[im 12/91  soft-tissue]
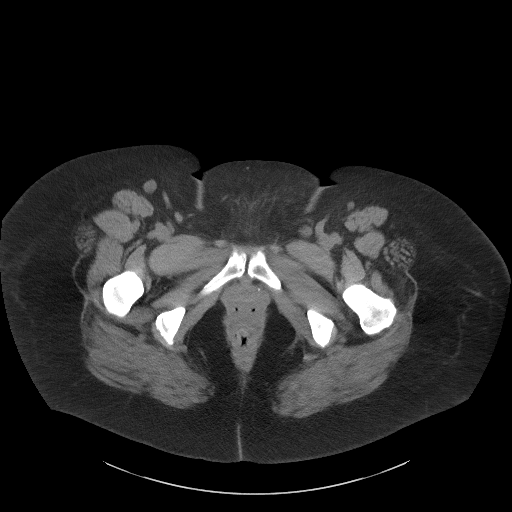
[im 17/91  soft-tissue]
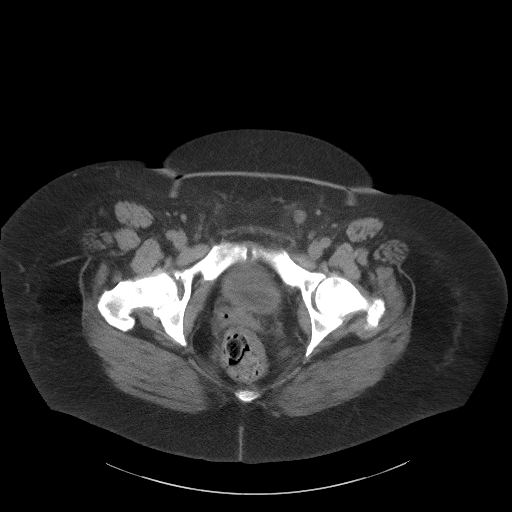
[im 29/91  soft-tissue]
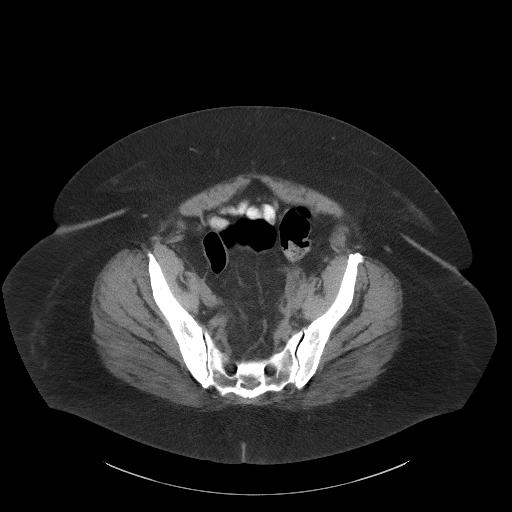
[im 34/91  soft-tissue]
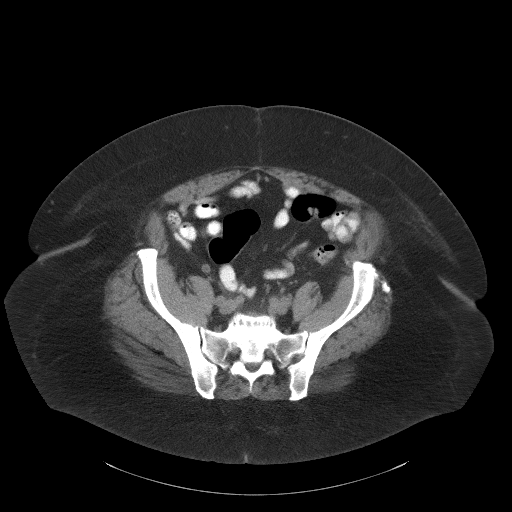
[im 40/91  soft-tissue]
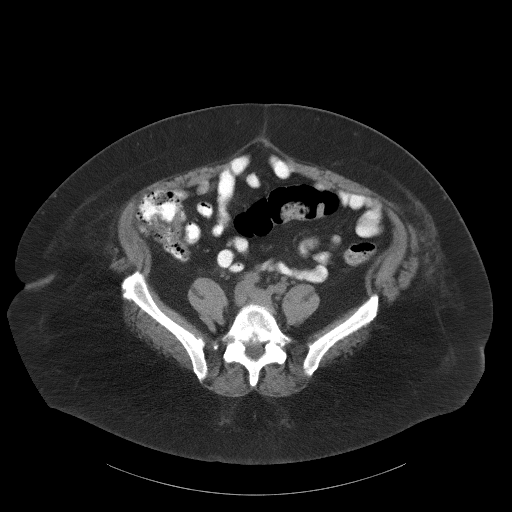
[im 46/91  soft-tissue]
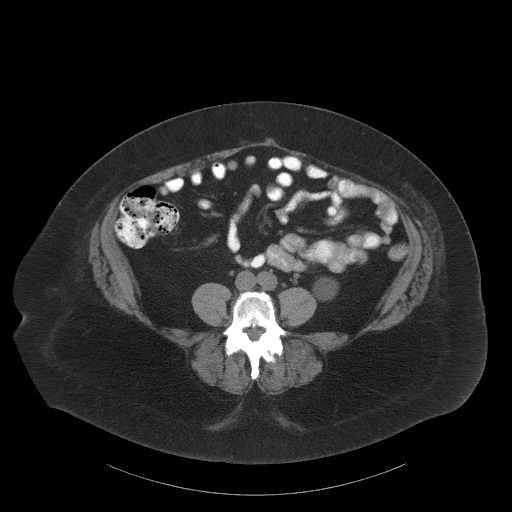
[im 51/91  soft-tissue]
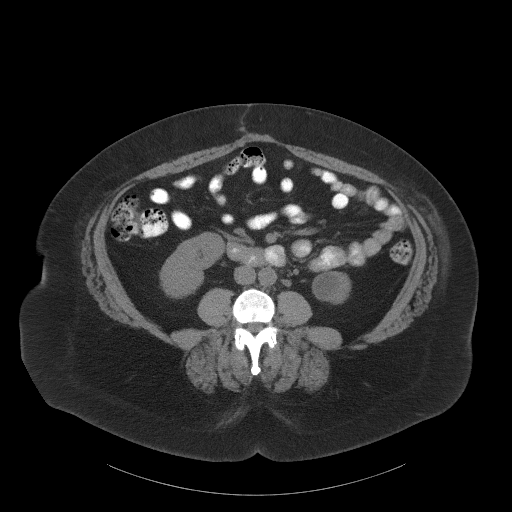
[im 57/91  soft-tissue]
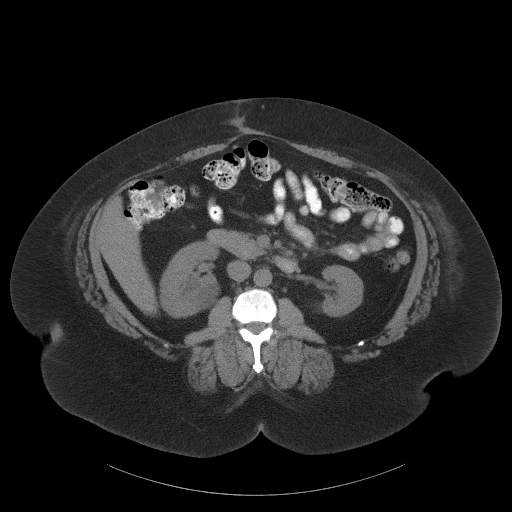
[im 57/91  bone]
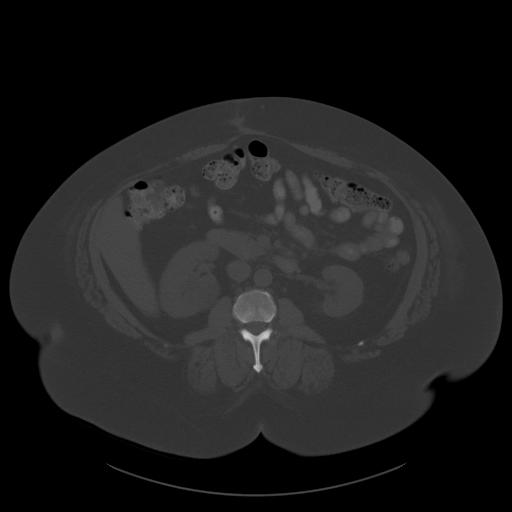
[im 62/91  soft-tissue]
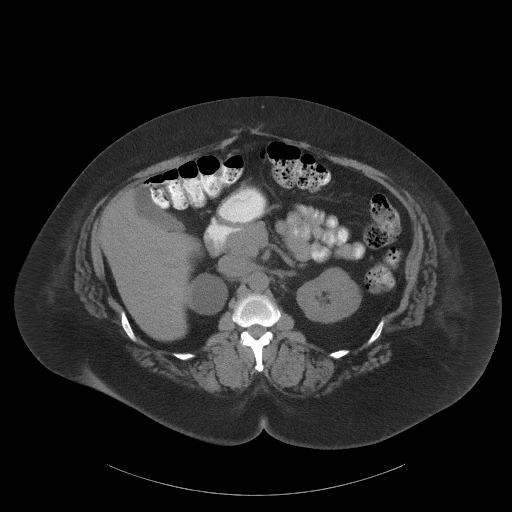
[im 74/91  soft-tissue]
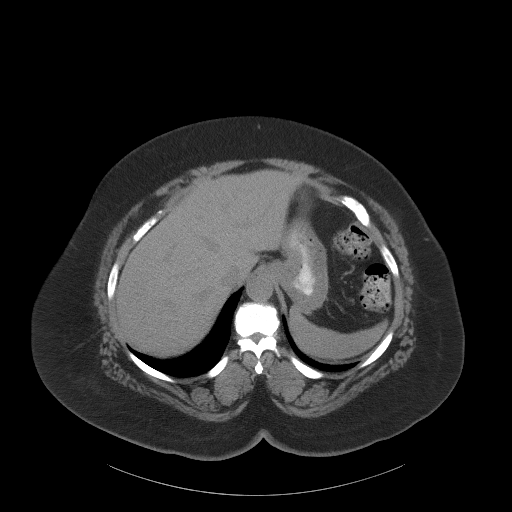
[im 79/91  soft-tissue]
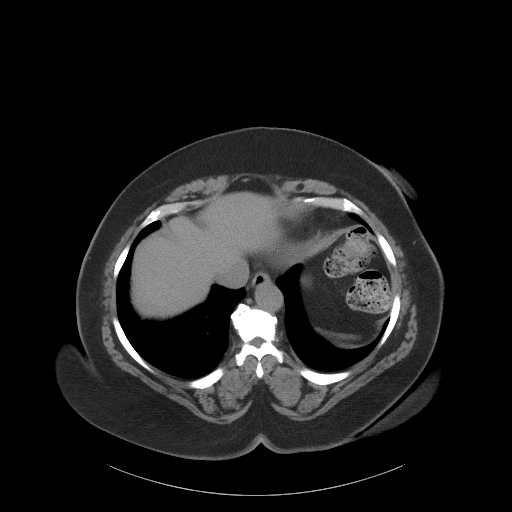
[im 85/91  soft-tissue]
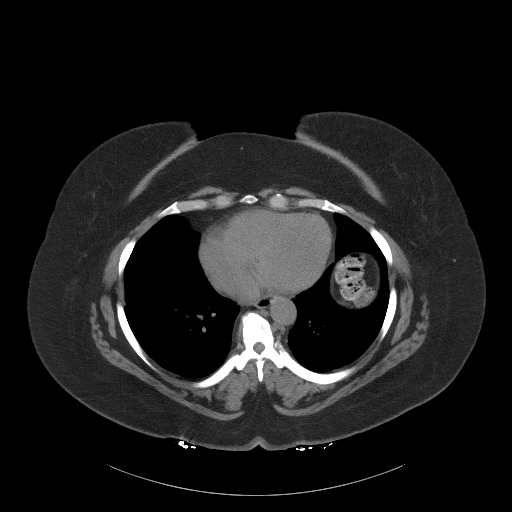

[Series 5: coronal st · coronal · 0.93mm/px · 3 of 116 slices shown]
[im 39/116  soft-tissue]
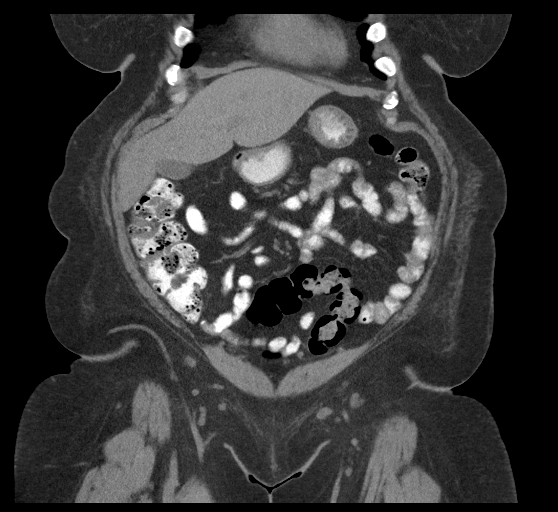
[im 52/116  soft-tissue]
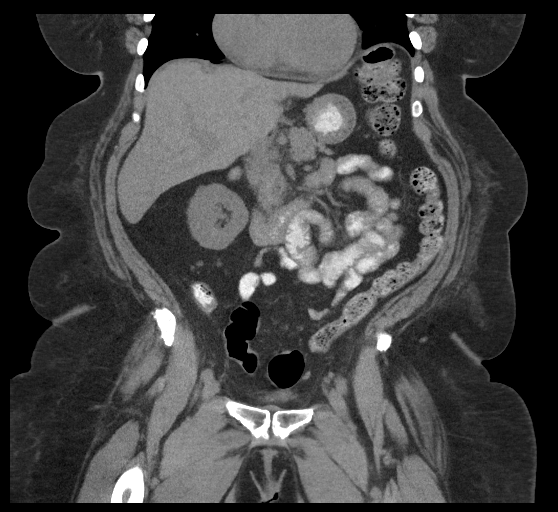
[im 64/116  soft-tissue]
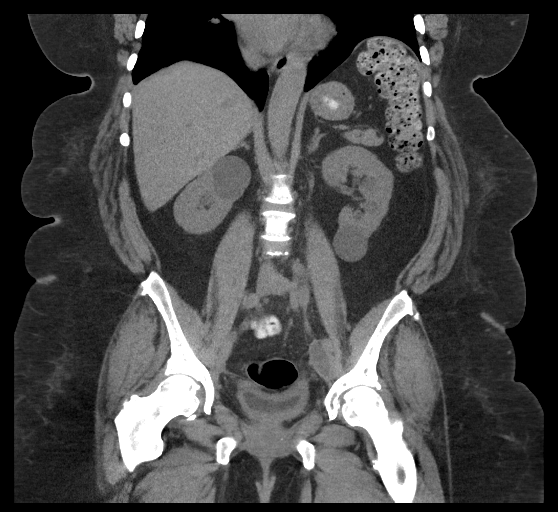

[16 of 46 positions shown; findings below may reference images not displayed]

FINDINGS: Lower chest: Mild bibasilar atelectasis. The visualized lung bases
are otherwise clear. Visualized heart and pericardium are
unremarkable.

Hepatobiliary: No focal liver abnormality is seen. No gallstones,
gallbladder wall thickening, or biliary dilatation.

Pancreas: Unremarkable

Spleen: Unremarkable

Adrenals/Urinary Tract: Stable 15 mm nodule within the left adrenal
gland, indeterminate, but unchanged from remote prior examination of
07/21/2019. The right adrenal gland is unremarkable. The kidneys are
normal in size and position. Multiple simple exophytic cortical
cysts are seen arising from the kidneys bilaterally the kidneys are
otherwise unremarkable. The bladder is largely decompressed and is
unremarkable.

Stomach/Bowel: Stomach, small bowel, and large bowel are
unremarkable. Appendix normal. No free intraperitoneal gas or fluid.

Vascular/Lymphatic: No pathologic adenopathy within the abdomen and
pelvis. Shotty bilateral inguinal adenopathy is stable without
pathologic enlargement. The abdominal vasculature is unremarkable.

Reproductive: Status post hysterectomy and bilateral salpingo
oophorectomy. Infiltrative soft tissue within the posterior right
peritoneum is in continuity with the right ovarian vein and may
simply represent postsurgical change following salpingectomy. This
is unchanged from prior examination and best seen on image # 56/2.
Left pelvic sidewall seroma related to pelvic lymphadenectomy has
slightly decreased in size, measuring 1.9 x 4.2 cm at axial image #
65/2.

Other: No abdominal wall hernia.  Rectum unremarkable.

Musculoskeletal: Degenerative changes are seen within the lumbar
spine. No lytic or blastic bone lesions are identified.
IMPRESSION: No acute intra-abdominal pathology identified. No radiographic
explanation for the patient's reported lower abdominal pain.

Stable right posterior peritoneal soft tissue nodule most likely
representing postsurgical change of right salpingectomy. No definite
evidence of recurrent or residual disease within the abdomen and
pelvis.

Stable 15 mm left adrenal nodule, indeterminate, but likely benign
given its stability over time.

Slight interval decrease in size in postoperative left pelvic
sidewall seroma.

## 2022-12-24 DIAGNOSIS — Z0000 Encounter for general adult medical examination without abnormal findings: Secondary | ICD-10-CM | POA: Diagnosis not present

## 2022-12-24 DIAGNOSIS — Z79899 Other long term (current) drug therapy: Secondary | ICD-10-CM | POA: Diagnosis not present

## 2022-12-24 DIAGNOSIS — Z1389 Encounter for screening for other disorder: Secondary | ICD-10-CM | POA: Diagnosis not present

## 2022-12-24 DIAGNOSIS — Z6841 Body Mass Index (BMI) 40.0 and over, adult: Secondary | ICD-10-CM | POA: Diagnosis not present

## 2023-02-10 NOTE — Progress Notes (Signed)
Radiation Oncology         [phone] ________________________________  Name: Brittney Lamb MRN: [id number]  Date: 02/11/2023  DOB: October 23, 1955  Follow-Up Visit Note  CC: Alinda Deem, MD  Alinda Deem, MD  No diagnosis found.  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 She was last seen here for follow-up on 02/05/22. No significant oncologic interval history since she was last seen for follow-up. Specifically, she has not followed up with Dr. Pricilla Holm since June of last year.   ***  Allergies:  is allergic to lisinopril and saxenda [liraglutide -weight management].  Meds: Current Outpatient Medications  Medication Sig Dispense Refill   acetaminophen (TYLENOL) 500 MG tablet Take 500 mg by mouth every 6 (six) hours as needed.     amLODipine (NORVASC) 2.5 MG tablet Take 2.5 mg by mouth daily.     azelastine (ASTELIN) 0.1 % nasal spray Place 1 spray into both nostrils 2 (two) times daily as needed for rhinitis. Use in each nostril as directed 30 mL 5   famotidine (PEPCID) 20 MG tablet Take 1 tablet (20 mg total) by mouth 2 (two) times daily. 120 tablet 5   fexofenadine (ALLEGRA ALLERGY) 180 MG tablet Can take one tablet by mouth one to two times daily as directed. 180 tablet 3   fluticasone (FLONASE) 50 MCG/ACT nasal spray Place 2 sprays into both nostrils daily. 16 g 5   hydrochlorothiazide (HYDRODIURIL) 25 MG tablet Take 25 mg by mouth daily.     meloxicam (MOBIC) 15 MG tablet Take 15 mg by mouth daily.     Multiple Vitamin (MULTIVITAMIN) tablet Take 1 tablet by  mouth daily.     rosuvastatin (CRESTOR) 20 MG tablet Take 20 mg by mouth daily.     No current facility-administered medications for this encounter.    Physical Findings: The patient is in no acute distress. Patient is alert and oriented.  vitals were not taken for this visit. .  No significant changes. Lungs are clear to auscultation bilaterally. Heart has regular rate and rhythm. No palpable cervical, supraclavicular, or axillary adenopathy. Abdomen soft, non-tender, normal bowel sounds.  On pelvic examination the external genitalia were unremarkable. A speculum exam was performed. There are no mucosal lesions noted in the vaginal vault. A Pap smear was obtained of the proximal vagina. On bimanual and rectovaginal examination there were no pelvic masses appreciated. ***   Lab Findings: Lab Results  Component Value Date   WBC 6.3 03/05/2021   HGB 11.9 (L) 03/05/2021   HCT 36.3 03/05/2021   MCV 87.1 03/05/2021   PLT 244 03/05/2021    Radiographic Findings: No results found.  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  ***  Plan:  ***   *** minutes of total time was spent for this patient encounter, including preparation, face-to-face counseling with the patient and coordination of care, physical exam, and documentation of the encounter. ____________________________________  Billie Lade, PhD, MD  This  document serves as a record of services personally performed by Antony Blackbird, MD. It was created on his behalf by Neena Rhymes, a trained medical scribe. The creation of this record is based on the scribe's personal observations and the provider's statements to them. This document has been checked and approved by the attending provider.

## 2023-02-11 ENCOUNTER — Encounter: Payer: Self-pay | Admitting: Radiation Oncology

## 2023-02-11 ENCOUNTER — Ambulatory Visit
Admission: RE | Admit: 2023-02-11 | Discharge: 2023-02-11 | Disposition: A | Discharge status: Home / Self Care | Payer: Medicare HMO | Source: Ambulatory Visit | Attending: Radiation Oncology | Admitting: Radiation Oncology

## 2023-02-11 VITALS — BP 110/53 | HR 81 | Temp 97.7°F | Resp 18 | Ht 64.0 in | Wt 264.4 lb

## 2023-02-11 DIAGNOSIS — Z8542 Personal history of malignant neoplasm of other parts of uterus: Secondary | ICD-10-CM | POA: Insufficient documentation

## 2023-02-11 DIAGNOSIS — C541 Malignant neoplasm of endometrium: Secondary | ICD-10-CM

## 2023-02-11 DIAGNOSIS — Z791 Long term (current) use of non-steroidal anti-inflammatories (NSAID): Secondary | ICD-10-CM | POA: Insufficient documentation

## 2023-02-11 DIAGNOSIS — Z923 Personal history of irradiation: Secondary | ICD-10-CM | POA: Diagnosis not present

## 2023-02-11 DIAGNOSIS — Z79899 Other long term (current) drug therapy: Secondary | ICD-10-CM | POA: Insufficient documentation

## 2023-02-11 NOTE — Progress Notes (Signed)
Brittney Lamb is here today for follow up post radiation to the pelvic.  They completed their radiation on:11/27/2019  Does the patient complain of any of the following:  Pain: No Abdominal bloating: No Diarrhea/Constipation: Yes, constipation but taking metamucil Nausea/Vomiting: No Vaginal Discharge: No Blood in Urine or Stool: No Urinary Issues (dysuria/incomplete emptying/ incontinence/ increased frequency/urgency): No Does patient report using vaginal dilator 2-3 times a week and/or sexually active 2-3 weeks: She states that she uses her dilators once a week. Post radiation skin changes: No issues   Additional comments if applicable: No concerns  BP (!) 110/53 (BP Location: Left Arm, Patient Position: Sitting)   Pulse 81   Temp 97.7 F (36.5 C) (Temporal)   Resp 18   Ht 5\' 4"  (1.626 m)   Wt 264 lb 6 oz (119.9 kg)   SpO2 100%   BMI 45.38 kg/m

## 2023-04-07 NOTE — Telephone Encounter (Signed)
Telephone call  

## 2023-04-30 DIAGNOSIS — Z6841 Body Mass Index (BMI) 40.0 and over, adult: Secondary | ICD-10-CM | POA: Diagnosis not present

## 2023-04-30 DIAGNOSIS — E669 Obesity, unspecified: Secondary | ICD-10-CM | POA: Diagnosis not present

## 2023-04-30 DIAGNOSIS — E7800 Pure hypercholesterolemia, unspecified: Secondary | ICD-10-CM | POA: Diagnosis not present

## 2023-04-30 DIAGNOSIS — I10 Essential (primary) hypertension: Secondary | ICD-10-CM | POA: Diagnosis not present

## 2023-04-30 DIAGNOSIS — Z79899 Other long term (current) drug therapy: Secondary | ICD-10-CM | POA: Diagnosis not present

## 2023-04-30 DIAGNOSIS — M25561 Pain in right knee: Secondary | ICD-10-CM | POA: Diagnosis not present

## 2023-05-22 IMAGING — CT CT ABD-PELV W/ CM
2 of 5 series · 17 of 46 positions shown, 19 images · IV contrast (OMNIPAQUE)
Comparison: 10/02/2020 and 03/19/2020

CLINICAL DATA: Follow-up endometrial carcinoma.  Surveillance.

EXAM:
CT ABDOMEN AND PELVIS WITH CONTRAST
TECHNIQUE: Multidetector CT imaging of the abdomen and pelvis was performed
using the standard protocol following bolus administration of
intravenous contrast.
CONTRAST:  75mL OMNIPAQUE IOHEXOL 350 MG/ML SOLN

[Series 2: axial st · axial · 0.84mm/px · z∈[-427,-37]mm · 14 of 91 slices shown, 16 images]
[im 7/91  soft-tissue]
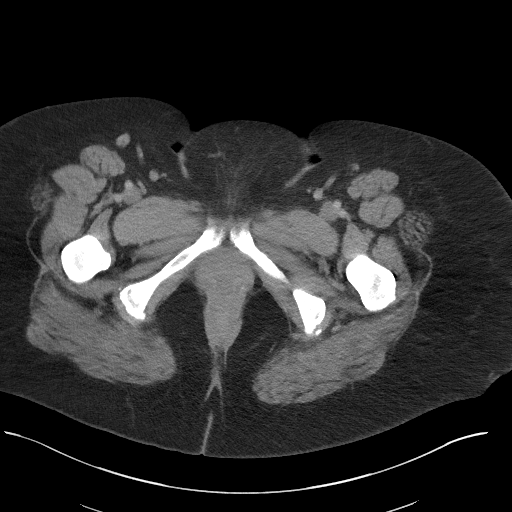
[im 7/91  bone]
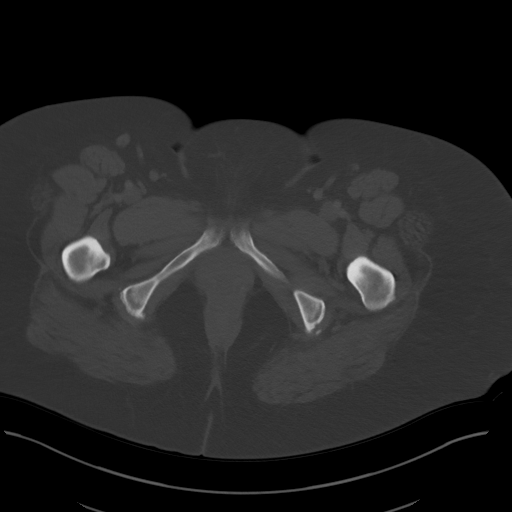
[im 13/91  soft-tissue]
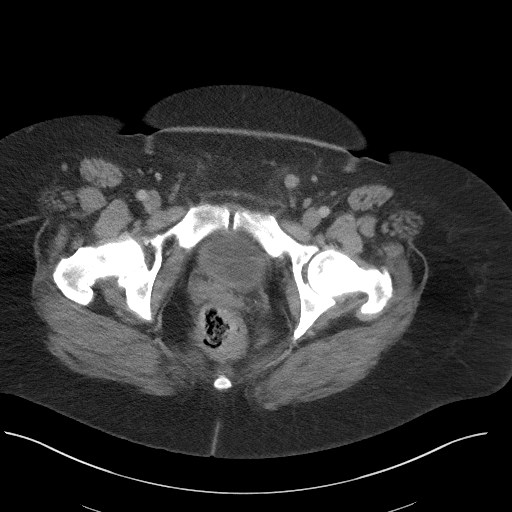
[im 19/91  soft-tissue]
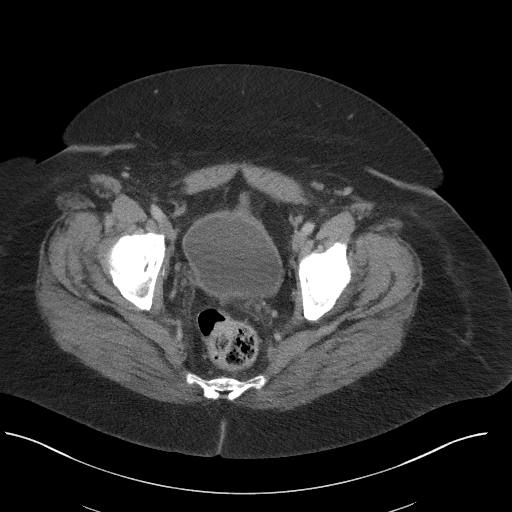
[im 25/91  soft-tissue]
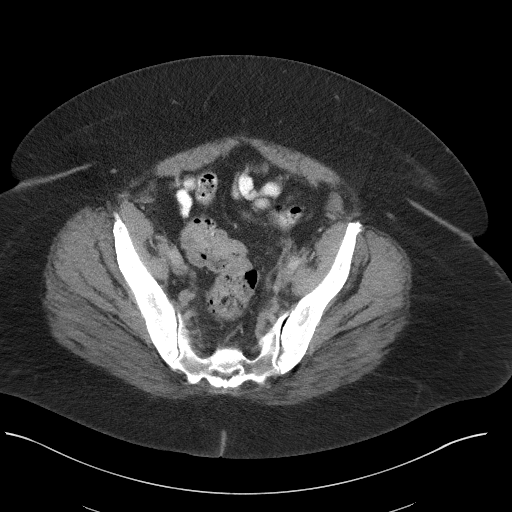
[im 31/91  soft-tissue]
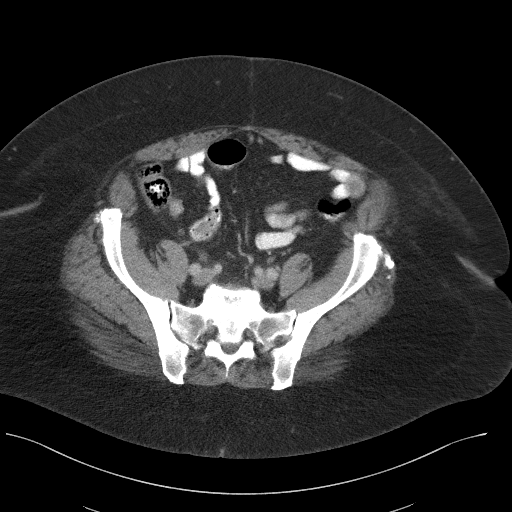
[im 37/91  soft-tissue]
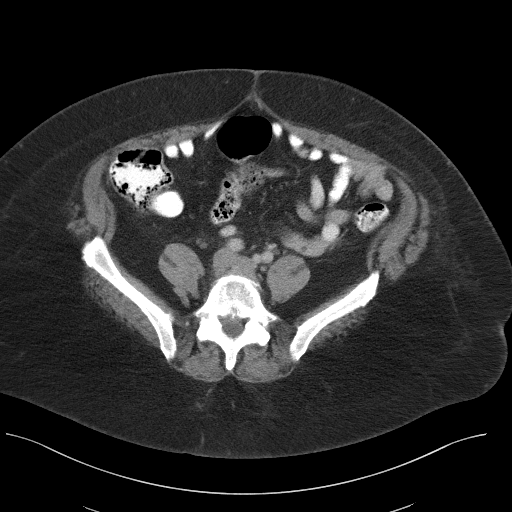
[im 43/91  soft-tissue]
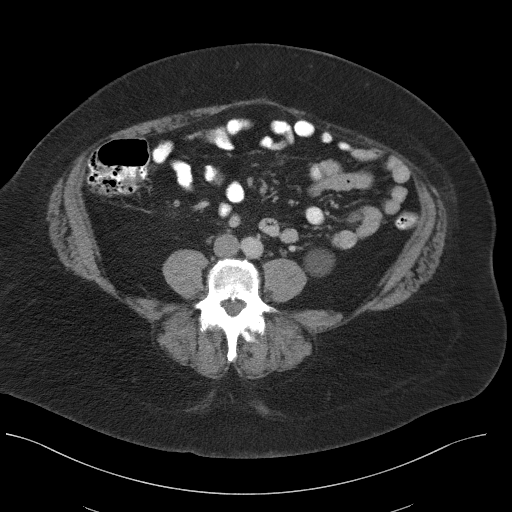
[im 49/91  soft-tissue]
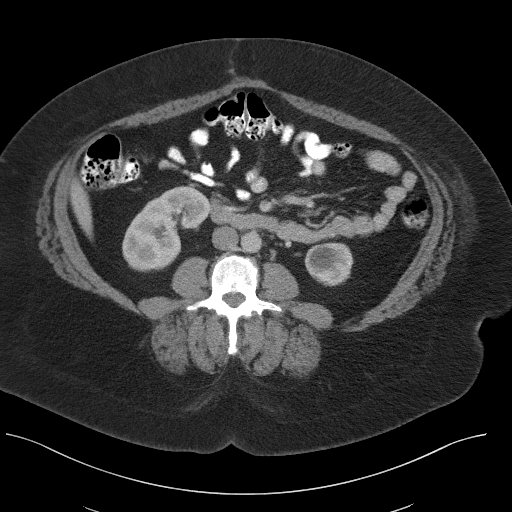
[im 55/91  soft-tissue]
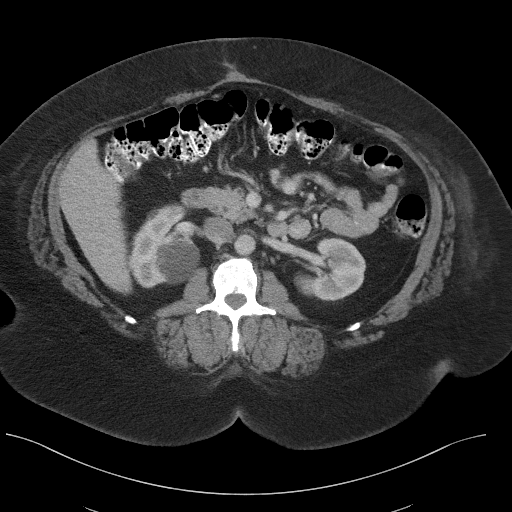
[im 55/91  bone]
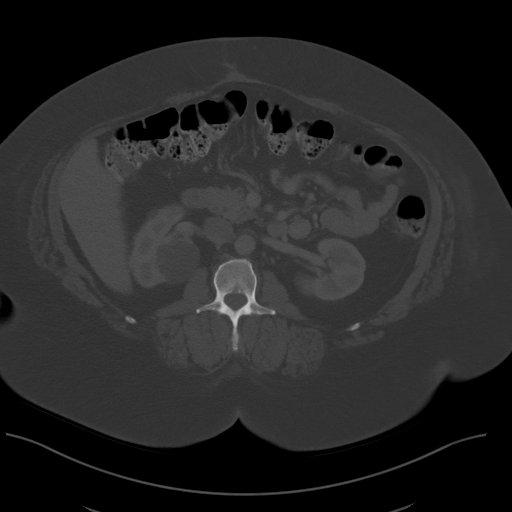
[im 61/91  soft-tissue]
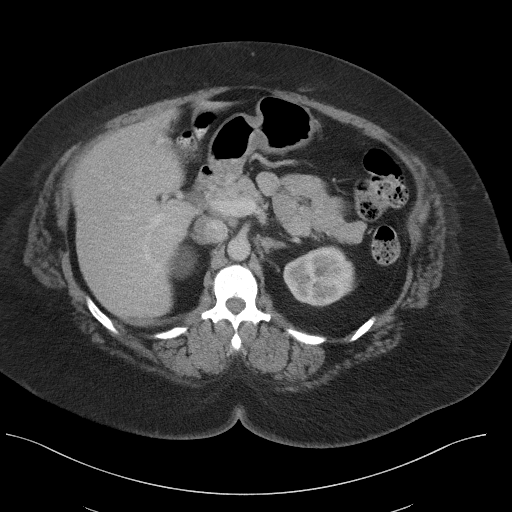
[im 67/91  soft-tissue]
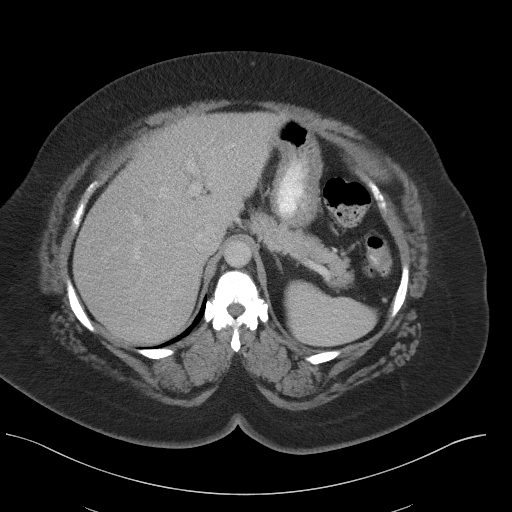
[im 73/91  soft-tissue]
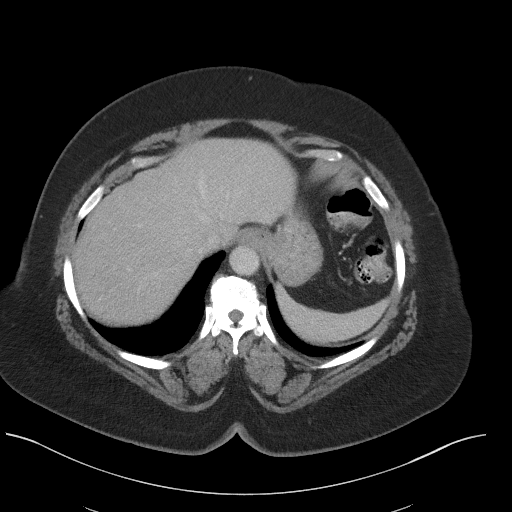
[im 79/91  soft-tissue]
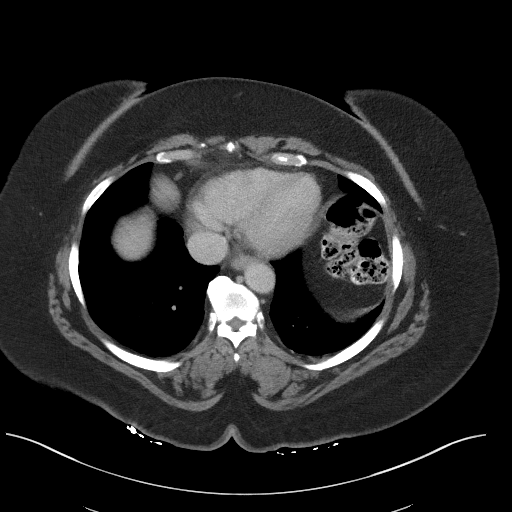
[im 85/91  soft-tissue]
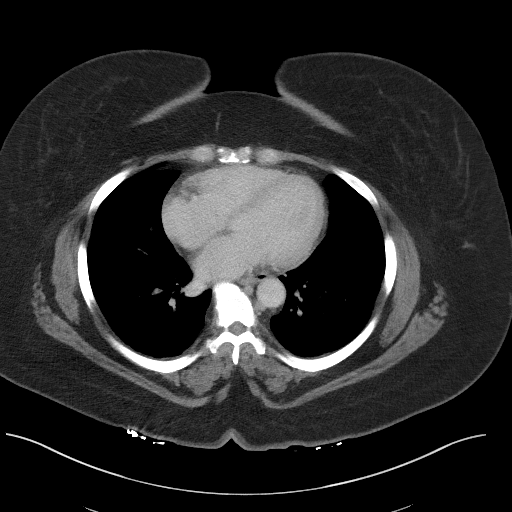

[Series 4: coronal st · coronal · 0.83mm/px · 3 of 113 slices shown]
[im 38/113  soft-tissue]
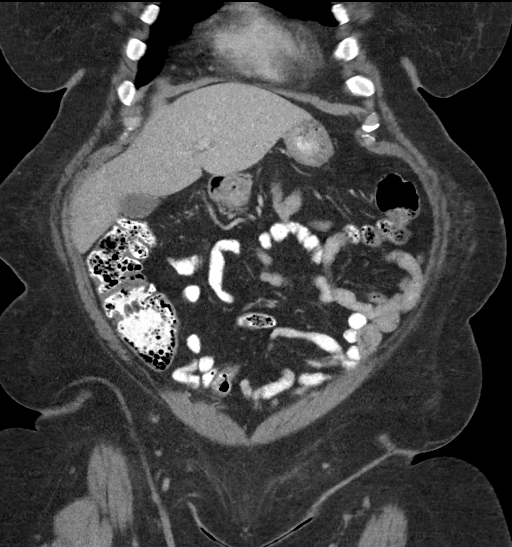
[im 50/113  soft-tissue]
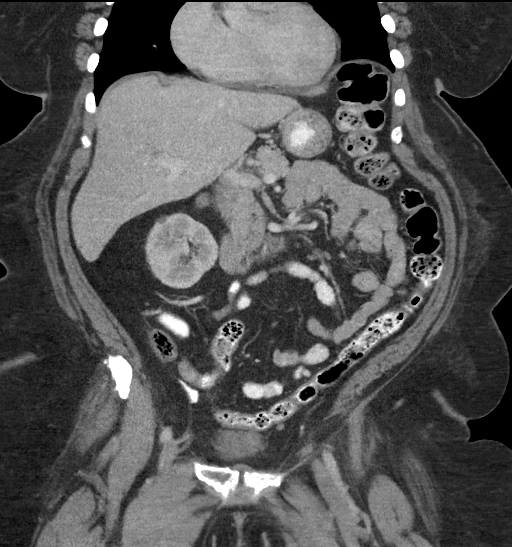
[im 63/113  soft-tissue]
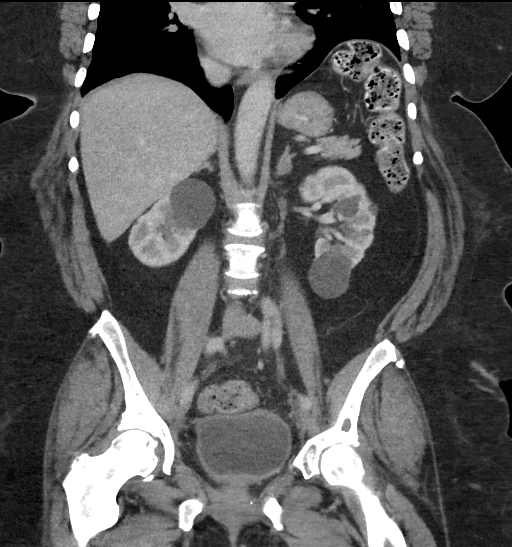

[17 of 46 positions shown; findings below may reference images not displayed]

FINDINGS: Lower Chest: No acute findings.

Hepatobiliary: No hepatic masses identified. Gallbladder is
unremarkable. No evidence of biliary ductal dilatation.

Pancreas:  No mass or inflammatory changes.

Spleen: Within normal limits in size and appearance.

Adrenals/Urinary Tract: Stable 1.5 cm left adrenal nodule,
consistent with benign adenoma or nodular hyperplasia. Stable
benign-appearing bilateral renal cysts. No renal masses identified.
No evidence of ureteral calculi or hydronephrosis.

Stomach/Bowel: No evidence of obstruction, inflammatory process or
abnormal fluid collections. Normal appendix visualized.

Vascular/Lymphatic: No pathologically enlarged lymph nodes. No acute
vascular findings.

Reproductive: Prior hysterectomy noted. There has been near complete
resolution of postop seroma seen along the left pelvic sidewall.
Adnexal regions are otherwise unremarkable in appearance.

Other:  None.

Musculoskeletal:  No suspicious bone lesions identified.
IMPRESSION: No acute findings. No evidence of recurrent or metastatic carcinoma
within the abdomen or pelvis.

Stable benign left adrenal adenoma or nodular hyperplasia.

## 2023-06-17 ENCOUNTER — Telehealth: Payer: Self-pay | Admitting: *Deleted

## 2023-06-17 NOTE — Telephone Encounter (Signed)
CALLED PATIENT TO INFORM OF FU APPT. WITH DR. Pricilla Holm ON 09-03-23- ARRIVAL TIME- 3:15 PM, LVM FOR A RETURN CALL

## 2023-06-17 NOTE — Telephone Encounter (Signed)
Talbert Forest from (RAD ONC) called office. Pt is scheduled for a follow up on April 18 th  at 3:30 with Dr. Pricilla Holm. Talbert Forest to notify pt of appointment date and time.

## 2023-07-01 DIAGNOSIS — Z1231 Encounter for screening mammogram for malignant neoplasm of breast: Secondary | ICD-10-CM | POA: Diagnosis not present

## 2023-07-12 ENCOUNTER — Telehealth: Payer: Self-pay | Admitting: *Deleted

## 2023-07-12 NOTE — Telephone Encounter (Signed)
 Returned patient's phone call, spoke with patient

## 2023-08-30 DIAGNOSIS — E7800 Pure hypercholesterolemia, unspecified: Secondary | ICD-10-CM | POA: Diagnosis not present

## 2023-08-30 DIAGNOSIS — Z6841 Body Mass Index (BMI) 40.0 and over, adult: Secondary | ICD-10-CM | POA: Diagnosis not present

## 2023-08-30 DIAGNOSIS — I10 Essential (primary) hypertension: Secondary | ICD-10-CM | POA: Diagnosis not present

## 2023-08-30 DIAGNOSIS — N1831 Chronic kidney disease, stage 3a: Secondary | ICD-10-CM | POA: Diagnosis not present

## 2023-09-03 ENCOUNTER — Encounter: Payer: Self-pay | Admitting: Gynecologic Oncology

## 2023-09-03 ENCOUNTER — Inpatient Hospital Stay: Payer: Medicare HMO | Attending: Gynecologic Oncology | Admitting: Gynecologic Oncology

## 2023-09-03 VITALS — BP 136/78 | HR 69 | Temp 97.7°F | Resp 18 | Ht 61.0 in | Wt 263.0 lb

## 2023-09-03 DIAGNOSIS — Z08 Encounter for follow-up examination after completed treatment for malignant neoplasm: Secondary | ICD-10-CM | POA: Diagnosis not present

## 2023-09-03 DIAGNOSIS — Z9079 Acquired absence of other genital organ(s): Secondary | ICD-10-CM | POA: Diagnosis not present

## 2023-09-03 DIAGNOSIS — Z9221 Personal history of antineoplastic chemotherapy: Secondary | ICD-10-CM | POA: Insufficient documentation

## 2023-09-03 DIAGNOSIS — Z8542 Personal history of malignant neoplasm of other parts of uterus: Secondary | ICD-10-CM | POA: Diagnosis not present

## 2023-09-03 DIAGNOSIS — Z90722 Acquired absence of ovaries, bilateral: Secondary | ICD-10-CM | POA: Diagnosis not present

## 2023-09-03 DIAGNOSIS — Z923 Personal history of irradiation: Secondary | ICD-10-CM | POA: Insufficient documentation

## 2023-09-03 DIAGNOSIS — C541 Malignant neoplasm of endometrium: Secondary | ICD-10-CM

## 2023-09-03 DIAGNOSIS — Z90710 Acquired absence of both cervix and uterus: Secondary | ICD-10-CM | POA: Insufficient documentation

## 2023-09-03 NOTE — Progress Notes (Signed)
 Gynecologic Oncology Return Clinic Visit  09/03/23  Reason for Visit: Surveillance visit in the setting of high risk endometrial cancer    Treatment History: Oncology History Overview Note  MSI-stable Final pathology showed clear cell carcinoma   Endometrial cancer (HCC)  04/17/2019 Initial Diagnosis   She presented with abnormal post menopausal bleeding   06/12/2019 Imaging   US  pelvis 1. Possible 1.9 cm solid echogenic endometrial mass. In the setting of post-menopausal bleeding, endometrial sampling is indicated to exclude carcinoma. I 2. Enlarged uterus with at least 1 calcified fibroid at the fundus measuring 2.6 cm. 3. Heterogenous fluid within the endometrial canal, can be seen in the setting of cervical obstruction. 4. Nonvisualized ovaries   06/30/2019 Initial Biopsy   EMB: high grade adenocarcinoma   07/14/2019 Tumor Marker   CA-125: 354   07/21/2019 Imaging   CT A/P: IMPRESSION: 1. No gross extension of uterine carcinoma beyond the myometrium. 2. No pelvic lymphadenopathy. 3. No retroperitoneal periaortic adenopathy. 4. No visceral metastasis or skeletal metastasis. 5. Benign adenoma of the LEFT adrenal gland.  Benign renal cysts.   08/15/2019 Surgery   Robotic-assisted laparoscopic total hysterectomy with bilateral salpingoophorectomy, SLN injection, bilateral pelvic LND, mini-lap for specimen removal Modifier 22: significant obesity causing difficulty visualizing anatomy, increasing OR time    08/15/2019 Pathology Results   A. 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  - The surgical resection margins are negative for carcinoma.  - See oncology table below.   Myometrium: Leiomyomata.  Serosa: Unremarkable.  Bilateral adnexa: Benign ovaries and fallopian tubes  with endometriosis.   B. LYMPH NODES, RIGHT PELVIC, RESECTION:  - There is no evidence of carcinoma in 6 of 6 lymph nodes (0/6).   C. LYMPH NODES, LEFT PELVIC, RESECTION:  - There is no evidence of carcinoma in 7 of 7 lymph nodes (0/7).    ONCOLOGY TABLE:   UTERUS, CARCINOMA OR CARCINOSARCOMA   Procedure: Total hysterectomy and bilateral salpingo-oophorectomy  Histologic type: Clear cell adenocarcinoma  Histologic Grade: High-grade  Myometrial invasion:       Depth of invasion: 19.5 mm       Myometrial thickness: 20 mm  Uterine Serosa Involvement: Not definitively identified  Cervical stromal involvement: Present  Extent of involvement of other organs: Confined to lower uterine segment  Lymphovascular invasion: Not identified  Regional Lymph Nodes:       Examined:        0 Sentinel                               13 non-sentinel                               13 total        Lymph nodes with metastasis: 0  Representative Tumor Block: A17  MMR / MSI testing: Can be ordered upon clinician request.  Pathologic Stage Classification (pTNM, AJCC 8th edition):  pT2, pN0  (FIGO stage II)  Comments: Dr. Ramey Rathdrum has reviewed selected slides and concurs with  the phenotype of this tumor.  Additional studies can be performed upon  clinician request.    08/15/2019 Cancer Staging   Staging form: Corpus Uteri - Carcinoma and Carcinosarcoma, AJCC 8th Edition -  Clinical stage from 08/15/2019: FIGO Stage II (cT2, cN0, cM0) - Signed by Suzi Essex, MD on 08/23/2019   09/18/2019 Imaging   Status post recent hysterectomy and bilateral salpingo-oophorectomy. Loculated fluid collections along the lateral pelvic sidewall may represent postoperative seroma or lymphocele. Superimposed infection or abscess are not excluded. Clinical correlation is recommended.   09/18/2019 Genetic Testing   PNegative genetic testing:  No pathogenic variants detected on the [facility] Common Hereditary Cancers Panel.  The report date is 09/18/2019.  The Common Hereditary Cancers Panel offered by [facility] includes sequencing and/or deletion duplication testing of the following 48 genes: APC, ATM, AXIN2, BARD1, BMPR1A, BRCA1, BRCA2, BRIP1, CDH1, CDK4, CDKN2A (p14ARF), CDKN2A (p16INK4a), CHEK2, CTNNA1, DICER1, EPCAM (Deletion/duplication testing only), GREM1 (promoter region deletion/duplication testing only), KIT, MEN1, MLH1, MSH2, MSH3, MSH6, MUTYH, NBN, NF1, NHTL1, PALB2, PDGFRA, PMS2, POLD1, POLE, PTEN, RAD50, RAD51C, RAD51D, RNF43, SDHB, SDHC, SDHD, SMAD4, SMARCA4. STK11, TP53, TSC1, TSC2, and VHL.  The following genes were evaluated for sequence changes only: SDHA and HOXB13 c.251G>A variant only.    09/20/2019 Procedure   Ultrasound and fluoroscopically guided right internal jugular single lumen power port catheter insertion. Tip in the SVC/RA junction. Catheter ready for use.   09/25/2019 - 10/23/2019 Chemotherapy   The patient had cisplatin  for chemotherapy treatment.     09/26/2019 - 11/27/2019 Radiation Therapy   Site Technique Total Dose (Gy) Dose per Fx (Gy) Completed Fx Beam Energies  Vagina: Pelvis HDR-brachy 18/18 6 3/3 Ir-192  Uterus: Uterus IMRT 45/45 1.8 25/25 6X      12/18/2019 - 02/19/2020 Chemotherapy   The patient had carboplatin  and taxol    03/19/2020 Imaging   1. Status post hysterectomy with bilateral salpingo oophorectomy since prior abdomen/pelvis CT of 07/21/2019. 2. Since pelvis CT 09/18/2019, interval decrease in size of the low-density lesions along the pelvic sidewalls bilaterally, compatible with resolving postoperative seromas. 3. 9 mm soft tissue nodule along the posterior right peritoneum, not definitely seen on prior imaging. Close attention on follow-up recommended. 4. Stable 12 mm left adrenal nodule, consistent with benign etiology.     03/14/2021 Imaging   No acute findings. No evidence of recurrent or metastatic carcinoma within the abdomen or pelvis.   Stable benign left  adrenal adenoma or nodular hyperplasia.     Interval History: Doing well.  Has lost 20 pounds since her last visit with me.  Working on slowly increasing exercise, cutting out certain foods like bread.  Had follow-up with her primary care provider this week and happy to report that her lab work all looked good.  Reports baseline bowel bladder function.  Denies any vaginal bleeding.  Denies any abdominal or pelvic pain.  Past Medical/Surgical History: Past Medical History:  Diagnosis Date   Arthritis    Spinal - inflammatory   BMI 50.0-59.9, adult (HCC)    Endometrial cancer (HCC) dx'd 06/2019   Family history of brain cancer    Family history of cervical cancer    Family history of colon cancer    GERD (gastroesophageal reflux disease)    History of radiation therapy 10/31/2019   IMRT uterus  09/26/2019-10/31/2019   Dr Retta Caster   History of radiation therapy 11/27/2019   vaginal brachytherapy  11/14/2019-11/27/2019   Dr Retta Caster   Hypertension    Morbid obesity (HCC)    Urticaria    Uterine cancer [facility])     Past Surgical History:  Procedure Laterality Date   cardiac catherization  2011   IR IMAGING  GUIDED PORT INSERTION  09/20/2019   IR REMOVAL TUN ACCESS W/ PORT W/O FL MOD SED  05/02/2021   LAPAROSCOPY  1996   scar tissue   ROBOTIC ASSISTED TOTAL HYSTERECTOMY WITH BILATERAL SALPINGO OOPHERECTOMY Bilateral 08/15/2019   Procedure: XI ROBOTIC ASSISTED TOTAL HYSTERECTOMY WITH BILATERAL SALPINGO OOPHORECTOMY;  Surgeon: Suzi Essex, MD;  Location: [facility];  Service: Gynecology;  Laterality: Bilateral;   SENTINEL NODE BIOPSY N/A 08/15/2019   Procedure: LYMPH NODE DISSECTION,LAPAROTOMY;  Surgeon: Suzi Essex, MD;  Location: [facility];  Service: Gynecology;  Laterality: N/A;   TONSILLECTOMY     WISDOM TOOTH EXTRACTION      Family History  Problem Relation Age of Onset   Hypertension Mother    COPD Mother    Heart disease Father    Thyroid disease Father    Cervical  cancer Paternal Aunt        dx. in her late 33s   Brain cancer Paternal Uncle 4   Cervical cancer Paternal Grandmother        dx. in her 49s   Hypertension Half-Brother    Thyroid disease Half-Sister    COPD Half-Sister    Hypertension Half-Sister    Neuropathy Half-Sister    Eczema Niece    Eczema Grandson    Uterine cancer Neg Hx    Breast cancer Neg Hx     Social History   Socioeconomic History   Marital status: Single    Spouse name: Not on file   Number of children: Not on file   Years of education: Not on file   Highest education level: Not on file  Occupational History   Not on file  Tobacco Use   Smoking status: Never    Passive exposure: Past   Smokeless tobacco: Never  Vaping Use   Vaping status: Never Used  Substance and Sexual Activity   Alcohol  use: Yes    Comment: rare glass of wine   Drug use: Never   Sexual activity: Not Currently  Other Topics Concern   Not on file  Social History Narrative   Not on file   Social Drivers of Health   Financial Resource Strain: Not on file  Food Insecurity: Not on file  Transportation Needs: Not on file  Physical Activity: Not on file  Stress: Not on file  Social Connections: Not on file    Current Medications:  Current Outpatient Medications:    acetaminophen  (TYLENOL ) 500 MG tablet, Take 500 mg by mouth every 6 (six) hours as needed., Disp: , Rfl:    amLODipine (NORVASC) 2.5 MG tablet, Take 2.5 mg by mouth daily., Disp: , Rfl:    azelastine  (ASTELIN ) 0.1 % nasal spray, Place 1 spray into both nostrils 2 (two) times daily as needed for rhinitis. Use in each nostril as directed, Disp: 30 mL, Rfl: 5   famotidine  (PEPCID ) 20 MG tablet, Take 1 tablet (20 mg total) by mouth 2 (two) times daily., Disp: 120 tablet, Rfl: 5   fexofenadine  (ALLEGRA  ALLERGY ) 180 MG tablet, Can take one tablet by mouth one to two times daily as directed., Disp: 180 tablet, Rfl: 3   fluticasone  (FLONASE ) 50 MCG/ACT nasal spray, Place 2  sprays into both nostrils daily., Disp: 16 g, Rfl: 5   hydrochlorothiazide (HYDRODIURIL) 25 MG tablet, Take 25 mg by mouth daily., Disp: , Rfl:    meloxicam (MOBIC) 15 MG tablet, Take 15 mg by mouth daily., Disp: , Rfl:    Multiple Vitamin (MULTIVITAMIN) tablet,  Take 1 tablet by mouth daily., Disp: , Rfl:    rosuvastatin (CRESTOR) 20 MG tablet, Take 20 mg by mouth daily., Disp: , Rfl:   Review of Systems: + Back pain, muscle pain/cramps, numbness Denies appetite changes, fevers, chills, fatigue, unexplained weight changes. Denies hearing loss, neck lumps or masses, mouth sores, ringing in ears or voice changes. Denies cough or wheezing.  Denies shortness of breath. Denies chest pain or palpitations. Denies leg swelling. Denies abdominal distention, pain, blood in stools, constipation, diarrhea, nausea, vomiting, or early satiety. Denies pain with intercourse, dysuria, frequency, hematuria or incontinence. Denies hot flashes, pelvic pain, vaginal bleeding or vaginal discharge.   Denies joint pain. Denies itching, rash, or wounds. Denies dizziness, headaches or seizures. Denies swollen lymph nodes or glands, denies easy bruising or bleeding. Denies anxiety, depression, confusion, or decreased concentration.  Physical Exam: BP 136/78 Comment: manual recheck  Pulse 69   Temp 97.7 F (36.5 C) (Oral)   Resp 18   Ht 5\' 1"  (1.549 m)   Wt 263 lb (119.3 kg)   SpO2 98%   BMI 49.69 kg/m  General: Alert, oriented, no acute distress. HEENT: Normocephalic, atraumatic, sclera anicteric. Chest: Clear to auscultation bilaterally.  No wheezes or rhonchi. Cardiovascular: Regular rate and rhythm, no murmurs. Abdomen: Obese, soft, nontender.  Normoactive bowel sounds.  No masses or hepatosplenomegaly appreciated.  Well-healed mini lap and laparoscopic incisions. Extremities: Grossly normal range of motion.  Warm, well perfused.  Trace edema bilaterally. Skin: No rashes or lesions noted. Lymphatics:  No cervical, supraclavicular, or inguinal adenopathy. GU: Normal appearing external genitalia without erythema, excoriation, or lesions.  Speculum exam reveals mildly atrophic vaginal mucosa.  Radiation changes noted at the apex, unchanged.  Bimanual exam reveals cuff is intact, no masses or nodularity.  Rectovaginal exam confirms these findings.  Laboratory & Radiologic Studies: None new  Assessment & Plan: Kiyani  L Adelstein is a 68 y.o. 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 MSS.   Patient is overall doing well and is NED on exam today.     Patient congratulated on her weight loss.   Per NCCN and SGO surveillance recommendations, we will continue with surveillance visits every 6 months.  We discussed signs and symptoms that would be concerning for recurrence and the patient knows to call if she develops any of these.    20 minutes of total time was spent for this patient encounter, including preparation, face-to-face counseling with the patient and coordination of care, and documentation of the encounter.  Wiley Hanger, MD  Division of Gynecologic Oncology  Department of Obstetrics and Gynecology  [facility] [place] [facility]

## 2023-09-03 NOTE — Patient Instructions (Signed)
 It was good to see you today.  I do not see or feel any evidence of cancer recurrence on your exam.  We will see you for follow-up in 12 months.  As always, if you develop any new and concerning symptoms before your next visit, please call to see me sooner.

## 2023-12-28 DIAGNOSIS — Z1211 Encounter for screening for malignant neoplasm of colon: Secondary | ICD-10-CM | POA: Diagnosis not present

## 2023-12-29 DIAGNOSIS — R7303 Prediabetes: Secondary | ICD-10-CM | POA: Diagnosis not present

## 2023-12-29 DIAGNOSIS — E7800 Pure hypercholesterolemia, unspecified: Secondary | ICD-10-CM | POA: Diagnosis not present

## 2023-12-29 DIAGNOSIS — I10 Essential (primary) hypertension: Secondary | ICD-10-CM | POA: Diagnosis not present

## 2023-12-29 DIAGNOSIS — Z0000 Encounter for general adult medical examination without abnormal findings: Secondary | ICD-10-CM | POA: Diagnosis not present

## 2023-12-29 DIAGNOSIS — Z6841 Body Mass Index (BMI) 40.0 and over, adult: Secondary | ICD-10-CM | POA: Diagnosis not present

## 2023-12-29 DIAGNOSIS — Z23 Encounter for immunization: Secondary | ICD-10-CM | POA: Diagnosis not present

## 2023-12-29 DIAGNOSIS — Z1389 Encounter for screening for other disorder: Secondary | ICD-10-CM | POA: Diagnosis not present

## 2023-12-29 DIAGNOSIS — Z8542 Personal history of malignant neoplasm of other parts of uterus: Secondary | ICD-10-CM | POA: Diagnosis not present

## 2024-01-13 DIAGNOSIS — E785 Hyperlipidemia, unspecified: Secondary | ICD-10-CM | POA: Diagnosis not present

## 2024-01-13 DIAGNOSIS — E669 Obesity, unspecified: Secondary | ICD-10-CM | POA: Diagnosis not present

## 2024-01-13 DIAGNOSIS — Z79899 Other long term (current) drug therapy: Secondary | ICD-10-CM | POA: Diagnosis not present

## 2024-01-13 DIAGNOSIS — Z800 Family history of malignant neoplasm of digestive organs: Secondary | ICD-10-CM | POA: Diagnosis not present

## 2024-01-13 DIAGNOSIS — I10 Essential (primary) hypertension: Secondary | ICD-10-CM | POA: Diagnosis not present

## 2024-01-13 DIAGNOSIS — Z1211 Encounter for screening for malignant neoplasm of colon: Secondary | ICD-10-CM | POA: Diagnosis not present

## 2024-01-13 DIAGNOSIS — Z8542 Personal history of malignant neoplasm of other parts of uterus: Secondary | ICD-10-CM | POA: Diagnosis not present

## 2024-01-13 DIAGNOSIS — Z6841 Body Mass Index (BMI) 40.0 and over, adult: Secondary | ICD-10-CM | POA: Diagnosis not present

## 2024-01-13 DIAGNOSIS — K219 Gastro-esophageal reflux disease without esophagitis: Secondary | ICD-10-CM | POA: Diagnosis not present

## 2024-02-09 NOTE — Progress Notes (Signed)
 Radiation Oncology         [phone] ________________________________  Name: Brittney Lamb MRN: [id number]  Date: 02/10/2024  DOB: 1956-03-18  Follow-Up Visit Note  CC: Gayl Males, MD  Gayl Males, MD  No diagnosis found.  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 She was last seen in office on 02/11/23 for a follow up visit. Patient continued to follow up with their specialists to manage their chronic conditions.   In the interval since she was last seen, she presented for a follow up visit with Dr. Viktoria on 09/03/23 during which she reported intentionally losing 20 pounds. Upon examination, she was noted NED without evidence of disease recurrence.    No other significant oncologic interval history since the patient was last seen.                        Allergies:  is allergic to lisinopril and saxenda [liraglutide -weight management].  Meds: Current Outpatient Medications  Medication Sig Dispense Refill   acetaminophen  (TYLENOL ) 500 MG tablet Take 500 mg by mouth every 6 (six) hours as needed.     amLODipine (NORVASC) 2.5 MG tablet Take 2.5 mg by mouth daily.     azelastine  (ASTELIN ) 0.1 % nasal spray Place 1 spray into both nostrils 2 (two) times daily as needed for rhinitis. Use in each nostril as directed 30 mL 5   famotidine  (PEPCID ) 20 MG tablet Take 1 tablet (20 mg total) by mouth 2 (two) times daily. 120 tablet 5   fexofenadine  (ALLEGRA  ALLERGY ) 180 MG tablet Can take one tablet by mouth one to two times daily as directed. 180 tablet 3   fluticasone   (FLONASE ) 50 MCG/ACT nasal spray Place 2 sprays into both nostrils daily. 16 g 5   hydrochlorothiazide (HYDRODIURIL) 25 MG tablet Take 25 mg by mouth daily.     meloxicam (MOBIC) 15 MG tablet Take 15 mg by mouth daily.     Multiple Vitamin (MULTIVITAMIN) tablet Take 1 tablet by mouth daily.     rosuvastatin (CRESTOR) 20 MG tablet Take 20 mg by mouth daily.     No current facility-administered medications for this visit.    Physical Findings: The patient is in no acute distress. Patient is alert and oriented.  vitals were not taken for this visit. .  No significant changes. Lungs are clear to auscultation bilaterally. Heart has regular rate and rhythm. No palpable cervical, supraclavicular, or axillary adenopathy. Abdomen soft, non-tender, normal bowel sounds.   Lab Findings: Lab Results  Component Value Date   WBC 6.3 03/05/2021   HGB 11.9 (L) 03/05/2021   HCT 36.3 03/05/2021   MCV 87.1 03/05/2021   PLT 244 03/05/2021    Radiographic Findings: No results found.  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  ***  Plan:  ***   *** minutes of total time was spent for this patient encounter, including preparation, face-to-face counseling with the  patient and coordination of care, physical exam, and documentation of the encounter. ____________________________________  Lynwood CHARM Nasuti, PhD, MD  This document serves as a record of services personally performed by Lynwood Nasuti, MD. It was created on his behalf by Reymundo Cartwright, a trained medical scribe. The creation of this record is based on the scribe's personal observations and the provider's statements to them. This document has been checked and approved by the attending provider.

## 2024-02-10 ENCOUNTER — Ambulatory Visit
Admission: RE | Admit: 2024-02-10 | Discharge: 2024-02-10 | Disposition: A | Discharge status: Home / Self Care | Payer: Self-pay | Source: Ambulatory Visit | Attending: Radiation Oncology | Admitting: Radiation Oncology

## 2024-02-10 ENCOUNTER — Encounter: Payer: Self-pay | Admitting: Radiation Oncology

## 2024-02-10 VITALS — BP 155/77 | HR 66 | Temp 97.1°F | Resp 18 | Ht 60.0 in | Wt 264.4 lb

## 2024-02-10 DIAGNOSIS — C541 Malignant neoplasm of endometrium: Secondary | ICD-10-CM | POA: Diagnosis not present

## 2024-02-10 DIAGNOSIS — Z923 Personal history of irradiation: Secondary | ICD-10-CM | POA: Insufficient documentation

## 2024-02-10 DIAGNOSIS — Z791 Long term (current) use of non-steroidal anti-inflammatories (NSAID): Secondary | ICD-10-CM | POA: Diagnosis not present

## 2024-02-10 DIAGNOSIS — Z79899 Other long term (current) drug therapy: Secondary | ICD-10-CM | POA: Insufficient documentation

## 2024-02-10 DIAGNOSIS — Z8542 Personal history of malignant neoplasm of other parts of uterus: Secondary | ICD-10-CM | POA: Diagnosis not present

## 2024-02-10 NOTE — Progress Notes (Signed)
 Brittney Lamb is here today for follow up post radiation to the pelvis.  They completed their radiation on: 11/27/19   Does the patient complain of any of the following:  Pain:No Abdominal bloating: No Diarrhea/Constipation: No Nausea/Vomiting: No Vaginal Discharge: No Blood in Urine or Stool: No Urinary Issues (dysuria/incomplete emptying/ incontinence/ increased frequency/urgency): No  Does patient report using vaginal dilator 2-3 times a week and/or sexually active 2-3 weeks: Yes Post radiation skin changes: No   Additional comments if applicable:    BP (!) 155/77   Pulse 66   Temp (!) 97.1 F (36.2 C)   Resp 18   Ht 5' (1.524 m)   Wt 264 lb 6.4 oz (119.9 kg)   SpO2 100%   BMI 51.64 kg/m

## 2024-02-17 DIAGNOSIS — N1831 Chronic kidney disease, stage 3a: Secondary | ICD-10-CM | POA: Diagnosis not present

## 2024-02-17 DIAGNOSIS — E7800 Pure hypercholesterolemia, unspecified: Secondary | ICD-10-CM | POA: Diagnosis not present

## 2024-02-17 DIAGNOSIS — Z23 Encounter for immunization: Secondary | ICD-10-CM | POA: Diagnosis not present

## 2024-02-17 DIAGNOSIS — I10 Essential (primary) hypertension: Secondary | ICD-10-CM | POA: Diagnosis not present

## 2024-02-17 DIAGNOSIS — Z6841 Body Mass Index (BMI) 40.0 and over, adult: Secondary | ICD-10-CM | POA: Diagnosis not present

## 2024-06-05 ENCOUNTER — Telehealth: Payer: Self-pay | Admitting: Radiation Oncology

## 2024-06-05 NOTE — Telephone Encounter (Signed)
 1/19 Left voicemail for patient to call our office to confirm the date/time of her rescheduled yearly follow up appt.

## 2024-09-12 ENCOUNTER — Inpatient Hospital Stay: Admitting: Gynecologic Oncology

## 2025-02-12 ENCOUNTER — Ambulatory Visit: Admitting: Radiation Oncology

## 2025-02-19 ENCOUNTER — Ambulatory Visit: Admitting: Radiation Oncology
